# Patient Record
Sex: Female | Born: 1935 | Race: White | Hispanic: No | Smoking: Never smoker
Health system: Southern US, Community
[De-identification: ages and names within clinical notes are randomized; demographics above are authoritative.]

## PROBLEM LIST (undated history)

## (undated) DIAGNOSIS — F0390 Unspecified dementia without behavioral disturbance: Secondary | ICD-10-CM

## (undated) DIAGNOSIS — I10 Essential (primary) hypertension: Secondary | ICD-10-CM

## (undated) DIAGNOSIS — IMO0001 Reserved for inherently not codable concepts without codable children: Secondary | ICD-10-CM

## (undated) DIAGNOSIS — H9190 Unspecified hearing loss, unspecified ear: Secondary | ICD-10-CM

## (undated) DIAGNOSIS — F03A0 Unspecified dementia, mild, without behavioral disturbance, psychotic disturbance, mood disturbance, and anxiety: Secondary | ICD-10-CM

## (undated) DIAGNOSIS — E079 Disorder of thyroid, unspecified: Secondary | ICD-10-CM

## (undated) HISTORY — PX: CHOLECYSTECTOMY: SHX55

---

## 2004-05-10 ENCOUNTER — Ambulatory Visit: Payer: Self-pay | Admitting: Internal Medicine

## 2004-10-18 ENCOUNTER — Ambulatory Visit: Payer: Self-pay

## 2005-05-13 ENCOUNTER — Ambulatory Visit: Payer: Self-pay | Admitting: Internal Medicine

## 2006-05-21 ENCOUNTER — Ambulatory Visit: Payer: Self-pay | Admitting: Internal Medicine

## 2007-05-25 ENCOUNTER — Ambulatory Visit: Payer: Self-pay | Admitting: Internal Medicine

## 2008-05-25 ENCOUNTER — Ambulatory Visit: Payer: Self-pay | Admitting: Internal Medicine

## 2009-05-26 ENCOUNTER — Ambulatory Visit: Payer: Self-pay | Admitting: Internal Medicine

## 2010-05-08 ENCOUNTER — Ambulatory Visit: Payer: Self-pay | Admitting: Ophthalmology

## 2010-05-23 ENCOUNTER — Ambulatory Visit: Payer: Self-pay | Admitting: Ophthalmology

## 2010-05-28 ENCOUNTER — Ambulatory Visit: Payer: Self-pay | Admitting: Internal Medicine

## 2018-03-30 ENCOUNTER — Emergency Department: Payer: Medicare Other

## 2018-03-30 ENCOUNTER — Other Ambulatory Visit: Payer: Self-pay

## 2018-03-30 ENCOUNTER — Emergency Department
Admission: EM | Admit: 2018-03-30 | Discharge: 2018-03-30 | Disposition: A | Discharge status: Home / Self Care | Payer: Medicare Other | Attending: Emergency Medicine | Admitting: Emergency Medicine

## 2018-03-30 DIAGNOSIS — W010XXA Fall on same level from slipping, tripping and stumbling without subsequent striking against object, initial encounter: Secondary | ICD-10-CM | POA: Insufficient documentation

## 2018-03-30 DIAGNOSIS — Y998 Other external cause status: Secondary | ICD-10-CM | POA: Insufficient documentation

## 2018-03-30 DIAGNOSIS — Y9301 Activity, walking, marching and hiking: Secondary | ICD-10-CM | POA: Diagnosis not present

## 2018-03-30 DIAGNOSIS — Y92019 Unspecified place in single-family (private) house as the place of occurrence of the external cause: Secondary | ICD-10-CM | POA: Insufficient documentation

## 2018-03-30 DIAGNOSIS — E079 Disorder of thyroid, unspecified: Secondary | ICD-10-CM | POA: Diagnosis not present

## 2018-03-30 DIAGNOSIS — I10 Essential (primary) hypertension: Secondary | ICD-10-CM | POA: Diagnosis not present

## 2018-03-30 DIAGNOSIS — S4991XA Unspecified injury of right shoulder and upper arm, initial encounter: Secondary | ICD-10-CM | POA: Diagnosis present

## 2018-03-30 DIAGNOSIS — S42291A Other displaced fracture of upper end of right humerus, initial encounter for closed fracture: Secondary | ICD-10-CM | POA: Insufficient documentation

## 2018-03-30 HISTORY — DX: Disorder of thyroid, unspecified: E07.9

## 2018-03-30 HISTORY — DX: Essential (primary) hypertension: I10

## 2018-03-30 MED ORDER — TRAMADOL HCL 50 MG PO TABS: 50.0000 mg | ORAL_TABLET | Freq: Two times a day (BID) | ORAL | 0 refills | Status: DC | PRN

## 2018-03-30 MED ORDER — ONDANSETRON HCL 4 MG/2ML IJ SOLN
4.0000 mg | Freq: Once | INTRAMUSCULAR | Status: AC
  Administered 2018-03-30: 4 mg via INTRAVENOUS
  Filled 2018-03-30: qty 2

## 2018-03-30 MED ORDER — MORPHINE SULFATE (PF) 4 MG/ML IV SOLN
4.0000 mg | Freq: Once | INTRAVENOUS | Status: AC
  Administered 2018-03-30: 4 mg via INTRAVENOUS
  Filled 2018-03-30: qty 1

## 2018-03-30 NOTE — ED Triage Notes (Signed)
Pt to ER via [facility] from home after mechanical fall onto right shoulder. Severe pain with movement to right shoulder and upper right arm when palpating. VSS. CBG WNL. Denies other injuries. Pt alert and oriented X4, active, cooperative, pt in NAD. RR even and unlabored, color WNL.

## 2018-03-30 NOTE — ED Notes (Signed)
Gena from care management in with pt and family

## 2018-03-30 NOTE — ED Provider Notes (Signed)
Patient received in sign-out from Dr. Darnelle Catalan.  Workup and evaluation pending xr and reassessment.  Discussed case with Dr. Joice Lofts of orthopedics who agrees with plan for sling and follow-up as an outpatient.  Patient reassessed she remains neurovascularly intact and her pain is well controlled      Willy Eddy, MD 03/30/18 [phone]

## 2018-03-30 NOTE — ED Notes (Signed)
Resumed care from [facility] rn.  Pt alert. Family at bedside.

## 2018-03-30 NOTE — ED Provider Notes (Signed)
[facility] Emergency Department Provider Note   ____________________________________________   First MD Initiated Contact with Patient 03/30/18 1441     (approximate)  I have reviewed the triage vital signs and the nursing notes.   HISTORY  Chief Complaint Fall and Shoulder Pain   HPI Christina Wise is a 85 y.o. female who was in her home.  She was turning around backwards to go on another direction caught her foot and fell.  She landed on her right shoulder.  She did not hit her head.  She did not pass out.  She complains a lot of pain and swelling in the shoulder nowhere else.  No numbness in the hand or arm.  Normal function of the hand.   Past Medical History:  Diagnosis Date  . Hypertension   . Thyroid disease     There are no active problems to display for this patient.   History reviewed. No pertinent surgical history.  Prior to Admission medications   Medication Sig Start Date End Date Taking? Authorizing Provider  traMADol (ULTRAM) 50 MG tablet Take 1 tablet (50 mg total) by mouth every 12 (twelve) hours as needed for severe pain. 03/30/18 03/30/19  Willy Eddy, MD    Allergies Patient has no known allergies.  No family history on file.  Social History Social History   Tobacco Use  . Smoking status: Never Smoker  . Smokeless tobacco: Never Used  Substance Use Topics  . Alcohol use: Never    Frequency: Never  . Drug use: Not on file    Review of Systems  Constitutional: No fever/chills Eyes: No visual changes. ENT: No sore throat. Cardiovascular: Denies chest pain. Respiratory: Denies shortness of breath. Gastrointestinal: No abdominal pain.  No nausea, no vomiting.  No diarrhea.  No constipation. Genitourinary: Negative for dysuria. Musculoskeletal: Negative for back pain. Skin: Negative for rash. Neurological: Negative for headaches, focal weakness   ____________________________________________   PHYSICAL EXAM:  VITAL SIGNS: ED Triage Vitals  Enc Vitals Group     BP 03/30/18 1433 (!) 173/62     Pulse Rate 03/30/18 1433 65     Resp 03/30/18 1433 18     Temp 03/30/18 1433 98.2 F (36.8 C)     Temp Source 03/30/18 1433 Oral     SpO2 03/30/18 1433 95 %     Weight 03/30/18 1434 128 lb (58.1 kg)     Height 03/30/18 1434 5\' 4"  (1.626 m)     Head Circumference --      Peak Flow --      Pain Score 03/30/18 1434 2     Pain Loc --      Pain Edu? --      Excl. in GC? --     Constitutional: Alert and oriented. Well appearing and in no acute distress is long she does not try to move the shoulder. Eyes: Conjunctivae are normal.  Head: Atraumatic. Nose: No congestion/rhinnorhea. Mouth/Throat: Mucous membranes are moist.  Oropharynx non-erythematous. Neck: No stridor. Cardiovascular: Normal rate, regular rhythm. Grossly normal heart sounds.  Good peripheral circulation. Respiratory: Normal respiratory effort.  No retractions. Lungs CTAB. Gastrointestinal: Soft and nontender. No distention. No abdominal bruits. No CVA tenderness. Musculoskeletal: No lower extremity tenderness nor edema.  She does have swelling and bruising in the right shoulder Neurologic:  Normal speech and language. No gross focal neurologic deficits are appreciated.  Skin:  Skin is warm, dry and intact. No rash noted. Psychiatric: Mood and affect  are normal. Speech and behavior are normal.  ____________________________________________   LABS (all labs ordered are listed, but only abnormal results are displayed)  Labs Reviewed - No data to display ____________________________________________  EKG   ____________________________________________  RADIOLOGY  ED MD interpretation:   Official radiology report(s): Dg Shoulder Right  Result Date: 03/30/2018 CLINICAL DATA:  Fall with shoulder pain EXAM: RIGHT SHOULDER - 2+ VIEW COMPARISON:  None. FINDINGS:  Right lung apex is clear. Mild AC joint degenerative change. Acute mildly impacted fracture involving the right humeral neck with mild valgus angulation. Fracture lucencies are noted within the greater tuberosity of the proximal humerus. No dislocation is evident. IMPRESSION: Acute mildly comminuted and angulated fracture involving the proximal humerus at the greater tuberosity and humeral neck. Electronically Signed   By: Jasmine Pang M.D.   On: 03/30/2018 15:19    ____________________________________________   PROCEDURES  Procedure(s) performed:   Procedures  Critical Care performed:   ____________________________________________   INITIAL IMPRESSION / ASSESSMENT AND PLAN / ED COURSE     X-ray pending.  I have a critical patient.  I signed patient out to Dr. Roxan Hockey.       ____________________________________________   FINAL CLINICAL IMPRESSION(S) / ED DIAGNOSES  Final diagnoses:  Other closed displaced fracture of proximal end of right humerus, initial encounter     ED Discharge Orders         Ordered    traMADol (ULTRAM) 50 MG tablet  Every 12 hours PRN     03/30/18 1703           Note:  This document was prepared using Dragon voice recognition software and may include unintentional dictation errors.    Arnaldo Natal, MD 03/30/18 1750

## 2018-03-30 NOTE — Care Management Note (Signed)
Case Management Note  Patient Details  Name: SADIA FARONE MRN: [id number] Date of Birth: Apr 18, 1921  Subjective/Objective:         Patient is being seen in the ED after a fall at home resulting in right humerus fracture.  Patient will be discharge home with a right arm sling.  Patient lives alone and is independent.  Patient's son is at the bedside, he lives in [place].  Patient is very hard of hearing.  Because of this fall and the new arm immobilizer home health services were offered and accepted.  Choice offered to patient and she has no preference.  Referral made to [place] with [facility] for RN, PT, OT, aide, and SW, referral accepted.  No equipment needed at this time.  Patient will have a good friend spend the night with her tonight.  Son reports that this is the first fall in quite a while and the patient is not sure what happened.   Robbie Lis RN BSN [id number]               Action/Plan: Discharge with right arm immobilizer/sling. Home Health with [facility] RN, PT, OT, aide, SW Friend will spend the night with patient.  Expected Discharge Date:                  Expected Discharge Plan:  Home w Home Health Services  In-House Referral:     Discharge planning Services  CM Consult  Post Acute Care Choice:    Choice offered to:  Patient, Adult Children  DME Arranged:    DME Agency:     HH Arranged:  RN, PT, OT, Social Work, Nurse's Aide HH Agency:  [facility]  Status of Service:  Completed, signed off  If discussed at [employer], dates discussed:    Additional Comments:  Allayne Butcher, RN 03/30/2018, 5:24 PM

## 2018-03-30 NOTE — ED Notes (Signed)
md in with pt and family.   

## 2018-03-30 NOTE — ED Notes (Addendum)
Sling to  right arm.  Iv dc'ed.

## 2018-05-01 ENCOUNTER — Inpatient Hospital Stay
Admission: EM | Admit: 2018-05-01 | Discharge: 2018-05-06 | DRG: 640 | Disposition: A | Discharge status: Skilled Nursing Facility | Payer: Medicare Other | Attending: Internal Medicine | Admitting: Internal Medicine

## 2018-05-01 ENCOUNTER — Other Ambulatory Visit: Payer: Self-pay

## 2018-05-01 ENCOUNTER — Emergency Department: Payer: Medicare Other

## 2018-05-01 DIAGNOSIS — E039 Hypothyroidism, unspecified: Secondary | ICD-10-CM | POA: Diagnosis present

## 2018-05-01 DIAGNOSIS — R8271 Bacteriuria: Secondary | ICD-10-CM

## 2018-05-01 DIAGNOSIS — Z973 Presence of spectacles and contact lenses: Secondary | ICD-10-CM | POA: Diagnosis not present

## 2018-05-01 DIAGNOSIS — I248 Other forms of acute ischemic heart disease: Secondary | ICD-10-CM | POA: Diagnosis present

## 2018-05-01 DIAGNOSIS — W19XXXD Unspecified fall, subsequent encounter: Secondary | ICD-10-CM | POA: Diagnosis present

## 2018-05-01 DIAGNOSIS — E876 Hypokalemia: Secondary | ICD-10-CM | POA: Diagnosis present

## 2018-05-01 DIAGNOSIS — N179 Acute kidney failure, unspecified: Secondary | ICD-10-CM | POA: Diagnosis present

## 2018-05-01 DIAGNOSIS — Z79899 Other long term (current) drug therapy: Secondary | ICD-10-CM

## 2018-05-01 DIAGNOSIS — R339 Retention of urine, unspecified: Secondary | ICD-10-CM | POA: Diagnosis present

## 2018-05-01 DIAGNOSIS — Z681 Body mass index (BMI) 19 or less, adult: Secondary | ICD-10-CM

## 2018-05-01 DIAGNOSIS — G9341 Metabolic encephalopathy: Secondary | ICD-10-CM | POA: Diagnosis present

## 2018-05-01 DIAGNOSIS — H9190 Unspecified hearing loss, unspecified ear: Secondary | ICD-10-CM | POA: Diagnosis present

## 2018-05-01 DIAGNOSIS — Z8249 Family history of ischemic heart disease and other diseases of the circulatory system: Secondary | ICD-10-CM | POA: Diagnosis not present

## 2018-05-01 DIAGNOSIS — R4182 Altered mental status, unspecified: Secondary | ICD-10-CM | POA: Diagnosis present

## 2018-05-01 DIAGNOSIS — E43 Unspecified severe protein-calorie malnutrition: Secondary | ICD-10-CM | POA: Diagnosis present

## 2018-05-01 DIAGNOSIS — E871 Hypo-osmolality and hyponatremia: Principal | ICD-10-CM | POA: Diagnosis present

## 2018-05-01 DIAGNOSIS — I491 Atrial premature depolarization: Secondary | ICD-10-CM | POA: Diagnosis present

## 2018-05-01 DIAGNOSIS — Z66 Do not resuscitate: Secondary | ICD-10-CM | POA: Diagnosis present

## 2018-05-01 DIAGNOSIS — Z7982 Long term (current) use of aspirin: Secondary | ICD-10-CM

## 2018-05-01 DIAGNOSIS — F0390 Unspecified dementia without behavioral disturbance: Secondary | ICD-10-CM | POA: Diagnosis present

## 2018-05-01 DIAGNOSIS — I119 Hypertensive heart disease without heart failure: Secondary | ICD-10-CM | POA: Diagnosis present

## 2018-05-01 DIAGNOSIS — F419 Anxiety disorder, unspecified: Secondary | ICD-10-CM | POA: Diagnosis present

## 2018-05-01 DIAGNOSIS — S42201D Unspecified fracture of upper end of right humerus, subsequent encounter for fracture with routine healing: Secondary | ICD-10-CM | POA: Diagnosis not present

## 2018-05-01 HISTORY — DX: Unspecified hearing loss, unspecified ear: H91.90

## 2018-05-01 HISTORY — DX: Unspecified dementia without behavioral disturbance: F03.90

## 2018-05-01 HISTORY — DX: Unspecified dementia, mild, without behavioral disturbance, psychotic disturbance, mood disturbance, and anxiety: F03.A0

## 2018-05-01 HISTORY — DX: Reserved for inherently not codable concepts without codable children: IMO0001

## 2018-05-01 LAB — URINALYSIS, COMPLETE (UACMP) WITH MICROSCOPIC
Bilirubin Urine: NEGATIVE
GLUCOSE, UA: NEGATIVE mg/dL
Hgb urine dipstick: NEGATIVE
KETONES UR: 5 mg/dL — AB
Leukocytes,Ua: NEGATIVE
Nitrite: NEGATIVE
PROTEIN: NEGATIVE mg/dL
Specific Gravity, Urine: 1.01 (ref 1.005–1.030)
pH: 7 (ref 5.0–8.0)

## 2018-05-01 LAB — CBC
HCT: 34.8 % — ABNORMAL LOW (ref 36.0–46.0)
Hemoglobin: 13 g/dL (ref 12.0–15.0)
MCH: 29.3 pg (ref 26.0–34.0)
MCHC: 37.4 g/dL — ABNORMAL HIGH (ref 30.0–36.0)
MCV: 78.6 fL — ABNORMAL LOW (ref 80.0–100.0)
PLATELETS: 270 10*3/uL (ref 150–400)
RBC: 4.43 MIL/uL (ref 3.87–5.11)
RDW: 13 % (ref 11.5–15.5)
WBC: 14.8 10*3/uL — ABNORMAL HIGH (ref 4.0–10.5)
nRBC: 0 % (ref 0.0–0.2)

## 2018-05-01 LAB — COMPREHENSIVE METABOLIC PANEL
ALT: 18 U/L (ref 0–44)
AST: 38 U/L (ref 15–41)
Albumin: 4.2 g/dL (ref 3.5–5.0)
Alkaline Phosphatase: 130 U/L — ABNORMAL HIGH (ref 38–126)
Anion gap: 14 (ref 5–15)
BUN: 26 mg/dL — ABNORMAL HIGH (ref 8–23)
CO2: 22 mmol/L (ref 22–32)
Calcium: 8.6 mg/dL — ABNORMAL LOW (ref 8.9–10.3)
Chloride: 73 mmol/L — ABNORMAL LOW (ref 98–111)
Creatinine, Ser: 1.02 mg/dL — ABNORMAL HIGH (ref 0.44–1.00)
GFR calc Af Amer: 55 mL/min — ABNORMAL LOW (ref >60)
GFR calc non Af Amer: 47 mL/min — ABNORMAL LOW (ref >60)
Glucose, Bld: 110 mg/dL — ABNORMAL HIGH (ref 70–99)
Potassium: 3.4 mmol/L — ABNORMAL LOW (ref 3.5–5.1)
SODIUM: 109 mmol/L — AB (ref 135–145)
Total Bilirubin: 1.9 mg/dL — ABNORMAL HIGH (ref 0.3–1.2)
Total Protein: 6.2 g/dL — ABNORMAL LOW (ref 6.5–8.1)

## 2018-05-01 LAB — SODIUM, URINE, RANDOM: SODIUM UR: 35 mmol/L

## 2018-05-01 LAB — OSMOLALITY, URINE: Osmolality, Ur: 335 mOsm/kg (ref 300–900)

## 2018-05-01 LAB — SODIUM: Sodium: 112 mmol/L — CL (ref 135–145)

## 2018-05-01 LAB — MAGNESIUM: Magnesium: 1.6 mg/dL — ABNORMAL LOW (ref 1.7–2.4)

## 2018-05-01 LAB — GLUCOSE, CAPILLARY
Glucose-Capillary: 113 mg/dL — ABNORMAL HIGH (ref 70–99)
Glucose-Capillary: 104 mg/dL — ABNORMAL HIGH (ref 70–99)

## 2018-05-01 LAB — PHOSPHORUS: PHOSPHORUS: 2.8 mg/dL (ref 2.5–4.6)

## 2018-05-01 LAB — AMMONIA: Ammonia: <9 umol/L — ABNORMAL LOW (ref 9–35)

## 2018-05-01 LAB — TROPONIN I: Troponin I: 0.05 ng/mL (ref <0.03)

## 2018-05-01 MED ORDER — MONTELUKAST SODIUM 10 MG PO TABS
10.0000 mg | ORAL_TABLET | Freq: Every day | ORAL | Status: DC
  Administered 2018-05-01 – 2018-05-05 (×5): 10 mg via ORAL
  Filled 2018-05-01 (×5): qty 1

## 2018-05-01 MED ORDER — ONDANSETRON HCL 4 MG PO TABS: 4.0000 mg | ORAL_TABLET | Freq: Four times a day (QID) | ORAL | Status: DC | PRN

## 2018-05-01 MED ORDER — LEVOTHYROXINE SODIUM 50 MCG PO TABS
50.0000 ug | ORAL_TABLET | Freq: Every day | ORAL | Status: DC
  Administered 2018-05-02 – 2018-05-06 (×5): 50 ug via ORAL
  Filled 2018-05-01 (×5): qty 1

## 2018-05-01 MED ORDER — ENOXAPARIN SODIUM 30 MG/0.3ML ~~LOC~~ SOLN
30.0000 mg | SUBCUTANEOUS | Status: DC
  Administered 2018-05-01: 30 mg via SUBCUTANEOUS
  Filled 2018-05-01: qty 0.3

## 2018-05-01 MED ORDER — ONDANSETRON HCL 4 MG/2ML IJ SOLN: 4.0000 mg | Freq: Four times a day (QID) | INTRAMUSCULAR | Status: DC | PRN

## 2018-05-01 MED ORDER — AMLODIPINE BESYLATE 5 MG PO TABS
5.0000 mg | ORAL_TABLET | Freq: Every day | ORAL | Status: DC
  Administered 2018-05-02 – 2018-05-06 (×5): 5 mg via ORAL
  Filled 2018-05-01 (×5): qty 1

## 2018-05-01 MED ORDER — ALPRAZOLAM 0.25 MG PO TABS
0.2500 mg | ORAL_TABLET | Freq: Three times a day (TID) | ORAL | Status: DC
  Administered 2018-05-01 – 2018-05-05 (×8): 0.25 mg via ORAL
  Filled 2018-05-01 (×8): qty 1

## 2018-05-01 MED ORDER — SODIUM CHLORIDE 0.9 % IV BOLUS
1000.0000 mL | Freq: Once | INTRAVENOUS | Status: AC
  Administered 2018-05-01: 1000 mL via INTRAVENOUS

## 2018-05-01 MED ORDER — CITALOPRAM HYDROBROMIDE 20 MG PO TABS: 20.0000 mg | ORAL_TABLET | Freq: Every day | ORAL | Status: DC

## 2018-05-01 MED ORDER — ASPIRIN EC 81 MG PO TBEC
81.0000 mg | DELAYED_RELEASE_TABLET | Freq: Every day | ORAL | Status: DC
  Administered 2018-05-02 – 2018-05-06 (×5): 81 mg via ORAL
  Filled 2018-05-01 (×5): qty 1

## 2018-05-01 MED ORDER — SODIUM CHLORIDE 0.9 % IV SOLN
INTRAVENOUS | Status: DC
  Administered 2018-05-01: 21:00:00 via INTRAVENOUS

## 2018-05-01 MED ORDER — ENOXAPARIN SODIUM 40 MG/0.4ML ~~LOC~~ SOLN: 40.0000 mg | SUBCUTANEOUS | Status: DC

## 2018-05-01 MED ORDER — POTASSIUM CHLORIDE 20 MEQ PO PACK
20.0000 meq | PACK | Freq: Once | ORAL | Status: AC
  Administered 2018-05-01: 20 meq via ORAL
  Filled 2018-05-01: qty 1

## 2018-05-01 MED ORDER — ACETAMINOPHEN 325 MG PO TABS
650.0000 mg | ORAL_TABLET | Freq: Four times a day (QID) | ORAL | Status: DC | PRN
  Administered 2018-05-01: 650 mg via ORAL
  Filled 2018-05-01: qty 2

## 2018-05-01 MED ORDER — ACETAMINOPHEN 650 MG RE SUPP: 650.0000 mg | Freq: Four times a day (QID) | RECTAL | Status: DC | PRN

## 2018-05-01 MED ORDER — SODIUM CHLORIDE 0.9 % IV SOLN
1.0000 g | Freq: Once | INTRAVENOUS | Status: AC
  Administered 2018-05-01: 1 g via INTRAVENOUS
  Filled 2018-05-01: qty 10

## 2018-05-01 NOTE — ED Notes (Addendum)
Notified Dr Sharma Covert of Sodium of 109 and troponin of 0.05- no new orders given at this time

## 2018-05-01 NOTE — H&P (Signed)
[facility] [place] [facility]   PATIENT NAME: Christina Wise    MR#:  [id number]  DATE OF BIRTH:  June 06, 1921  DATE OF ADMISSION:  05/01/2018  PRIMARY CARE PHYSICIAN: Kandyce Rud, MD   REQUESTING/REFERRING PHYSICIAN: Dr Virgilio Frees  CHIEF COMPLAINT:   Chief Complaint  Patient presents with  . Weakness    HISTORY OF PRESENT ILLNESS:  Christina Wise  is a 85 y.o. female with a known history of hypertension and hypothyroidism presents with weakness and decline in mental status.  As per the son patient had a fall 1 month ago and fractured her humerus.  She is out of the sling right now.  She has been having home health.  She lives alone but has a friend staying with her.  She has not been eating or drinking very well over the last few days.  On Wednesday she had trouble getting into the car.  The last couple days she has been having difficulty walking.  Last night and today could not even stand.  She has been very weak and tired.  Patient is very hard of hearing and difficult with communication right at this time.  History obtained from old chart and the son at the bedside.  PAST MEDICAL HISTORY:   Past Medical History:  Diagnosis Date  . Hearing impairment   . Hypertension   . Mild dementia (HCC)   . Thyroid disease     PAST SURGICAL HISTORY:   Past Surgical History:  Procedure Laterality Date  . CHOLECYSTECTOMY      SOCIAL HISTORY:   Social History   Tobacco Use  . Smoking status: Never Smoker  . Smokeless tobacco: Never Used  Substance Use Topics  . Alcohol use: Never    Frequency: Never    FAMILY HISTORY:   Family History  Problem Relation Age of Onset  . Hypertension Mother     DRUG ALLERGIES:  No Known Allergies  REVIEW OF SYSTEMS:  CONSTITUTIONAL: No fever.  Positive for fatigue EYES: Wears glasses EARS, NOSE, AND THROAT: Extremely hard of hearing RESPIRATORY: No cough, shortness of breath.   CARDIOVASCULAR: No chest pain.  GASTROINTESTINAL: No nausea, vomiting, diarrhea or abdominal pain.  GENITOURINARY: Complains of burning in her vagina ENDOCRINE: Positive for hypothyroidism HEMATOLOGY: No anemia SKIN: No rash. MUSCULOSKELETAL: Occasional shoulder pain.   NEUROLOGIC: Positive for weakness.  PSYCHIATRY: History of anxiety  MEDICATIONS AT HOME:   Prior to Admission medications   Medication Sig Start Date End Date Taking? Authorizing Provider  traMADol (ULTRAM) 50 MG tablet Take 1 tablet (50 mg total) by mouth every 12 (twelve) hours as needed for severe pain. 03/30/18 03/30/19  Willy Eddy, MD   Medication reconciliation still undergoing  Looking at [facility] clinic note.  Patient takes Xanax 0.25 mg 3 times a day as needed, amlodipine 5 mg daily, aspirin 81 mg daily, calcium carbonate and vitamin D twice a day, Zyrtec 10 mg daily, Celexa 20 mg daily, glucosamine chondroitin daily, hydrochlorothiazide 25 mg daily, levothyroxine 75 mcg daily, losartan 50 mg daily, Singulair 10 mg daily, fish oil twice a day, tramadol as needed.  As per son finished up Cipro last night  VITAL SIGNS:  Blood pressure (!) 146/65, pulse (!) 52, temperature (!) 97.4 F (36.3 C), temperature source Oral, resp. rate 17, height 5\' 4"  (1.626 m), weight 58.1 kg, SpO2 99 %.  PHYSICAL EXAMINATION:  GENERAL:  85 y.o.-year-old patient lying in the bed with no acute distress.  EYES: Pupils  equal, round, reactive to light and accommodation. No scleral icterus. Extraocular muscles intact.  HEENT: Head atraumatic, normocephalic. Oropharynx and nasopharynx clear.  NECK:  Supple, no jugular venous distention. No thyroid enlargement, no tenderness.  LUNGS: Normal breath sounds bilaterally, no wheezing, rales,rhonchi or crepitation. No use of accessory muscles of respiration.  CARDIOVASCULAR: S1, S2 normal. No murmurs, rubs, or gallops.  ABDOMEN: Soft, nontender, nondistended. Bowel sounds present. No  organomegaly or mass.  EXTREMITIES: No pedal edema, cyanosis, or clubbing.  NEUROLOGIC: Patient moving all extremities on her own.  Patient is very difficult with her hearing. Gait not checked.  PSYCHIATRIC: The patient is alert and answers some questions.  SKIN: No rash, lesion, or ulcer.   LABORATORY PANEL:   CBC Recent Labs  Lab 05/01/18 1611  WBC 14.8*  HGB 13.0  HCT 34.8*  PLT 270   ------------------------------------------------------------------------------------------------------------------  Chemistries  Recent Labs  Lab 05/01/18 1611  NA 109*  K 3.4*  CL 73*  CO2 22  GLUCOSE 110*  BUN 26*  CREATININE 1.02*  CALCIUM 8.6*  AST 38  ALT 18  ALKPHOS 130*  BILITOT 1.9*   ------------------------------------------------------------------------------------------------------------------  Cardiac Enzymes Recent Labs  Lab 05/01/18 1611  TROPONINI 0.05*   ------------------------------------------------------------------------------------------------------------------  RADIOLOGY:  Dg Chest 2 View  Result Date: 05/01/2018 CLINICAL DATA:  Pt to ED for AMS. Pt is unable to describe what is wrong or give hx. Pt's relative states pt has increased weakness over the past couple of days and is not eating, also states pt was treated for a UTI with Cipro one week ago-states she is not feeling any better. History of hypertension. EXAM: CHEST - 2 VIEW COMPARISON:  RIGHT shoulder on 03/30/2018 FINDINGS: Shallow lung inflation. Heart size is accentuated by low lung volumes. There are no focal consolidations or pleural effusions. No pulmonary edema. Again noted is impacted RIGHT humerus fracture. IMPRESSION: 1. Shallow inflation. 2. No evidence for acute cardiopulmonary abnormality. 3. Stable appearance of RIGHT humerus fracture. Electronically Signed   By: Norva PavlovElizabeth  Brown M.D.   On: 05/01/2018 16:57   Ct Head Wo Contrast  Result Date: 05/01/2018 CLINICAL DATA:  Altered mental  status and weakness EXAM: CT HEAD WITHOUT CONTRAST TECHNIQUE: Contiguous axial images were obtained from the base of the skull through the vertex without intravenous contrast. COMPARISON:  None. FINDINGS: Brain: Generalized atrophic and chronic ischemic changes are noted. No findings to suggest acute hemorrhage, acute infarction or space-occupying mass lesion are noted. Vascular: No hyperdense vessel or unexpected calcification. Skull: Normal. Negative for fracture or focal lesion. Sinuses/Orbits: No acute finding. Other: None. IMPRESSION: Chronic atrophic and ischemic changes without acute abnormality. Electronically Signed   By: Alcide CleverMark  Lukens M.D.   On: 05/01/2018 17:13    EKG:   To me the EKG looks like sinus arrhythmia 61 bpm, LVH  IMPRESSION AND PLAN:   1.  Severe hyponatremia with weakness and acute metabolic encephalopathy.  Likely combination from hydrochlorothiazide, not eating and drinking very well.  Will give gentle IV fluids with normal saline 40 cc/h.  Check urine osmolarity and urine random sodium.  Check serial sodiums every 4 hours.  If sodium in the next 24 hours goes above 117 we will stop the IV fluids. 2.  Essential hypertension.  Stop hydrochlorothiazide this is not a good medication for her at this point in time.  Continue Norvasc. 3.  Hypothyroidism unspecified.  Check a TSH tomorrow. 4.  Mild dementia 5.  Hypokalemia replace potassium orally 6.  Once sodium  comes up in the 120s can get physical therapy evaluation   All the records are reviewed and case discussed with ED provider. Management plans discussed with the patient, family and they are in agreement.  CODE STATUS: DNR  TOTAL TIME TAKING CARE OF THIS PATIENT: 50 minutes.    Alford Highland M.D on 05/01/2018 at 6:50 PM  Between 7am to 6pm - Pager - [phone]  After 6pm call admission pager [phone]  [facility] Office  [phone]  CC: Primary care physician; Kandyce Rud, MD

## 2018-05-01 NOTE — Consult Note (Signed)
PULMONARY / CRITICAL CARE MEDICINE  Name: Christina Wise MRN: [id number] DOB: Nov 17, 1921    LOS: 0  Referring Provider:  Dr Renae Gloss Reason for Referral:  Hyponatremia  HPI:  This is a 85 y/o female who presented to the ED with altered mental status, and generalized weakness.  History is obtained from ED records as patient is still confused.  Per ED records, patient's son indicated that she fell about a month ago and fractured her right humerus.  She did not undergo surgical intervention.  She was discharged home with a sling and home health care.  Recently patient's appetite has been declining and she has not been feeling well overall.  This week her functional status continued to decline with increased difficulty walking.  Last night patient was noted to be severely weak and unable to stand up.  She was brought to the emergency room for evaluation.  At the ED, her sodium level was 109, potassium 3.4, chloride 73, bun 26 and creatinine 1.02.  She was admitted to the ICU for further management She is awake, confused and offers no complaints. Past Medical History:  Diagnosis Date  . Hearing impairment   . Hypertension   . Mild dementia (HCC)   . Thyroid disease    Past Surgical History:  Procedure Laterality Date  . CHOLECYSTECTOMY     Prior to Admission medications   Medication Sig Start Date End Date Taking? Authorizing Provider  amLODipine (NORVASC) 5 MG tablet Take 5 mg by mouth daily.   Yes [provider]  clopidogrel (PLAVIX) 75 MG tablet Take 75 mg by mouth daily.   Yes [provider]  donepezil (ARICEPT) 5 MG tablet Take 1 tablet (5 mg total) by mouth at bedtime. 10/06/17 11/15/17 Yes Sowles, Danna Hefty, MD  empagliflozin (JARDIANCE) 25 MG TABS tablet Take 25 mg by mouth daily.   Yes [provider]  glycopyrrolate (ROBINUL) 1 MG tablet Take 1 mg by mouth 2 (two) times daily.   Yes [provider]  insulin aspart (NOVOLOG FLEXPEN) 100  UNIT/ML FlexPen Inject 12 Units into the skin 2 (two) times daily.   Yes [provider]  insulin aspart (NOVOLOG) 100 UNIT/ML FlexPen Inject 18 Units into the skin daily. At 1700   Yes [provider]  Insulin Degludec-Liraglutide (XULTOPHY) 100-3.6 UNIT-MG/ML SOPN Inject 50 Units into the skin daily.   Yes [provider]  levETIRAcetam (KEPPRA) 500 MG tablet Take 500 mg by mouth 2 (two) times daily.   Yes [provider]  lipase/protease/amylase (CREON) 12000 units CPEP capsule Take 6,000 Units by mouth 3 (three) times daily before meals.   Yes [provider]  lipase/protease/amylase (CREON) 12000 units CPEP capsule Take 3,000 Units by mouth at bedtime. With snack   Yes [provider]  lisinopril (PRINIVIL,ZESTRIL) 5 MG tablet Take 5 mg by mouth daily.   Yes [provider]  metoprolol succinate (TOPROL-XL) 25 MG 24 hr tablet Take 1 tablet (25 mg total) by mouth daily. 10/06/17  Yes Sowles, Danna Hefty, MD  rosuvastatin (CRESTOR) 40 MG tablet Take 1 tablet (40 mg total) by mouth daily. 10/06/17 11/15/17 Yes Alba Cory, MD  aspirin EC 81 MG tablet Take 81 mg by mouth daily.    [provider]  famotidine (PEPCID) 20 MG tablet Take 1 tablet (20 mg total) by mouth 2 (two) times daily. 10/06/17 11/05/17  Alba Cory, MD  gabapentin (NEURONTIN) 300 MG capsule Take 1 capsule (300 mg total) by mouth 2 (two)  times daily. 10/06/17 11/05/17  Alba Cory, MD  insulin glargine (LANTUS) 100 UNIT/ML injection Inject 0.1 mLs (10 Units total) into the skin daily. 10/06/17 11/05/17  Alba Cory, MD  lacosamide 100 MG TABS Take 1 tablet (100 mg total) by mouth 2 (two) times daily. Patient not taking: Reported on 11/15/2017 02/21/17   Enedina Finner, MD  promethazine (PHENERGAN) 12.5 MG tablet Take 1 tablet (12.5 mg total) by mouth every 6 (six) hours as needed for nausea or vomiting. Patient not taking: Reported on 11/15/2017 12/10/16   Almond Lint, MD  sertraline (ZOLOFT) 25 MG tablet Take 1 tablet (25 mg total) by mouth daily. Patient not taking: Reported on 11/15/2017 10/06/17   Alba Cory, MD   Allergies Allergies  Allergen Reactions  . Alendronate Other (See Comments)    GI upset  . Codeine Nausea And Vomiting  . Risedronate Other (See Comments)    GI Upset    Family History Family History  Problem Relation Age of Onset  . Hypertension Mother    Social History  reports that she has never smoked. She has never used smokeless tobacco. She reports that she does not drink alcohol or use drugs.  Review Of Systems: Unable to obtain as patient is confused  VITAL SIGNS: BP 139/71   Pulse 60   Temp (!) 97.4 F (36.3 C) (Oral)   Resp 15   Ht 5\' 4"  (1.626 m)   Wt 58.1 kg   SpO2 98%   BMI 21.97 kg/m   HEMODYNAMICS:    VENTILATOR SETTINGS:    INTAKE / OUTPUT: I/O last 3 completed shifts: In: 1100 [IV Piggyback:1100] Out: -   PHYSICAL EXAMINATION: General: No acute distress HEENT: PERRLA, trachea midline, no JVD Neuro: Awake, alert, confused, moves all extremities Cardiovascular: Apical pulse regular, S1-S2, no murmur regurg or gallop, +2 pulses bilaterally Lungs: Bilateral breath sounds, diminished in the bases, no wheezes or rhonchi Abdomen: Nondistended, normal bowel sounds in all 4 quadrants, palpation reveals no organomegaly Musculoskeletal: Pain with flexion and extension of the right knee, no other joint deformities Skin: Left cheek bruise, no rash or lesions  LABS:  BMET Recent Labs  Lab 05/01/18 1611  NA 109*  K 3.4*  CL 73*  CO2 22  BUN 26*  CREATININE 1.02*  GLUCOSE 110*    Electrolytes Recent Labs  Lab 05/01/18 1611  CALCIUM 8.6*    CBC Recent Labs  Lab 05/01/18 1611  WBC 14.8*  HGB 13.0  HCT 34.8*  PLT 270    Coag's No results for input(s): APTT, INR in the last 168 hours.  Sepsis Markers No results for input(s): LATICACIDVEN, PROCALCITON, O2SATVEN in the  last 168 hours.  ABG No results for input(s): PHART, PCO2ART, PO2ART in the last 168 hours.  Liver Enzymes Recent Labs  Lab 05/01/18 1611  AST 38  ALT 18  ALKPHOS 130*  BILITOT 1.9*  ALBUMIN 4.2    Cardiac Enzymes Recent Labs  Lab 05/01/18 1611  TROPONINI 0.05*    Glucose Recent Labs  Lab 05/01/18 1635 05/01/18 2013  GLUCAP 113* 104*    Imaging Dg Chest 2 View  Result Date: 05/01/2018 CLINICAL DATA:  Pt to ED for AMS. Pt is unable to describe what is wrong or give hx. Pt's relative states pt has increased weakness over the past couple of days and is not eating, also states pt was treated for a UTI with Cipro one week ago-states she is not feeling any better. History of hypertension.  EXAM: CHEST - 2 VIEW COMPARISON:  RIGHT shoulder on 03/30/2018 FINDINGS: Shallow lung inflation. Heart size is accentuated by low lung volumes. There are no focal consolidations or pleural effusions. No pulmonary edema. Again noted is impacted RIGHT humerus fracture. IMPRESSION: 1. Shallow inflation. 2. No evidence for acute cardiopulmonary abnormality. 3. Stable appearance of RIGHT humerus fracture. Electronically Signed   By: Norva Pavlov M.D.   On: 05/01/2018 16:57   Ct Head Wo Contrast  Result Date: 05/01/2018 CLINICAL DATA:  Altered mental status and weakness EXAM: CT HEAD WITHOUT CONTRAST TECHNIQUE: Contiguous axial images were obtained from the base of the skull through the vertex without intravenous contrast. COMPARISON:  None. FINDINGS: Brain: Generalized atrophic and chronic ischemic changes are noted. No findings to suggest acute hemorrhage, acute infarction or space-occupying mass lesion are noted. Vascular: No hyperdense vessel or unexpected calcification. Skull: Normal. Negative for fracture or focal lesion. Sinuses/Orbits: No acute finding. Other: None. IMPRESSION: Chronic atrophic and ischemic changes without acute abnormality. Electronically Signed   By: Alcide Clever M.D.   On:  05/01/2018 17:13    STUDIES:  None  CULTURES: None  ANTIBIOTICS: None  SIGNIFICANT EVENTS: 05/02/2018: Admitted  LINES/TUBES: Peripheral IVs  DISCUSSION: 85 year old female presenting with acute metabolic encephalopathy, hyponatremia and volume depletion from poor oral intake.  ASSESSMENT Hyponatremia Hypomagnesemia Hypochloremia Hydration Mild leukocytosis Acute encephalopathy secondary to hyponatremia and volume depletion   PLAN Gentle IV hydration with normal saline Monitor sodium levels every 4 hours Correct electrolyte abnormalities Continue all home medications except hydrochlorothiazide Encourage oral intake as tolerated  Best Practice: Code Status: DNR Diet: Regular diet GI prophylaxis: Not indicated VTE prophylaxis: SCDs and Lovenox  FAMILY  - Updates: Family at bedside.  Will update when available.   S. [facility] ANP-BC Pulmonary and Critical Care Medicine [facility] Pager [phone] or [phone]  NB: This document was prepared using Dragon voice recognition software and may include unintentional dictation errors.    05/01/2018, 8:26 PM

## 2018-05-01 NOTE — ED Notes (Signed)
Pt assisted onto bedpan- voided a small amount of urine

## 2018-05-01 NOTE — ED Notes (Addendum)
Update to handoff: call Pattricia Boss, RN [phone]

## 2018-05-01 NOTE — Progress Notes (Signed)
Pharmacy Lovenox Dosing  85 y.o. female admitted with Weakness . Patient ordered Lovenox 40 mg daily for VTE prophylaxis.   Filed Weights   05/01/18 1609  Weight: 128 lb (58.1 kg)    Body mass index is 21.97 kg/m.  Estimated Creatinine Clearance: 29.1 mL/min (A) (by C-G formula based on SCr of 1.02 mg/dL (H)).  Will adjust Lovenox dosing to 30 mg daily.   Bettey Costa 05/01/2018 8:18 PM

## 2018-05-01 NOTE — ED Provider Notes (Addendum)
[facility] Emergency Department Provider Note  ____________________________________________  Time seen: Approximately 4:11 PM  I have reviewed the triage vital signs and the nursing notes.   HISTORY  Chief Complaint Weakness    HPI Christina Wise is a 85 y.o. female w/ a hx of HTN presenting for AMS and weakness/not walking.  The patient is reported to live alone with a daughter who checks on her daily.  Here, she unable to give any history and does not answer most of my questions.  Per report, the patient just completed a course of ciprofloxacin for UTI.  The patient reports lower abdominal pain on my exam but is unable to describe it or show me where it is.   Past Medical History:  Diagnosis Date  . Hypertension   . Thyroid disease     There are no active problems to display for this patient.   History reviewed. No pertinent surgical history.  Current Outpatient Rx  . Order #: [id number] Class: Normal    Allergies Patient has no known allergies.  No family history on file.  Social History Social History   Tobacco Use  . Smoking status: Never Smoker  . Smokeless tobacco: Never Used  Substance Use Topics  . Alcohol use: Never    Frequency: Never  . Drug use: Not on file    Review of Systems Unable to obtain due to patient mental status.   ____________________________________________   PHYSICAL EXAM:  VITAL SIGNS: ED Triage Vitals [05/01/18 1609]  Enc Vitals Group     BP      Pulse      Resp      Temp      Temp src      SpO2      Weight 128 lb (58.1 kg)     Height  (1.626 m)     Head Circumference      Peak Flow      Pain Score 0     Pain Loc      Pain Edu?      Excl. in GC?     Constitutional: The patient is alert and protecting her airway but is not able to answer question or follow basic commands. Eyes: Conjunctivae are normal.  EOMI. PERRLA.  No scleral icterus. Head: Bruising over the left zygomatic  arch.. Nose: No congestion/rhinnorhea.  No swelling over the nose or septal hematoma. Mouth/Throat: Mucous membranes are mildly dry.  No obvious dental injury or malocclusion..  Neck: No stridor.  Supple.  No meningismus. Cardiovascular: Normal rate, regular rhythm. No murmurs, rubs or gallops.  Respiratory: Normal respiratory effort.  No accessory muscle use or retractions. Lungs CTAB.  No wheezes, rales or ronchi. Gastrointestinal: Soft, nontender and mildly distended.  I am unable to reproduce any tenderness on my examination.  No guarding or rebound.  No peritoneal signs. Musculoskeletal: Pelvis is stable.  No LE edema. Neurologic:  A&Ox3.  Speech is clear.  Face and smile are symmetric.  EOMI.  Moves all extremities well. Skin:  Skin is warm, dry and intact. No rash noted.  Large area of ecchymosis noted to the right upper extremity from the mid upper arm through the forearm. Psychiatric: Unable to assess due to altered mental status.  ____________________________________________   LABS (all labs ordered are listed, but only abnormal results are displayed)  Labs Reviewed  URINALYSIS, COMPLETE (UACMP) WITH MICROSCOPIC - Abnormal; Notable for the following components:      Result Value  Color, Urine YELLOW (*)    APPearance CLEAR (*)    Ketones, ur 5 (*)    Bacteria, UA RARE (*)    All other components within normal limits  CBC - Abnormal; Notable for the following components:   WBC 14.8 (*)    HCT 34.8 (*)    MCV 78.6 (*)    MCHC 37.4 (*)    All other components within normal limits  COMPREHENSIVE METABOLIC PANEL - Abnormal; Notable for the following components:   Sodium 109 (*)    Potassium 3.4 (*)    Chloride 73 (*)    Glucose, Bld 110 (*)    BUN 26 (*)    Creatinine, Ser 1.02 (*)    Calcium 8.6 (*)    Total Protein 6.2 (*)    Alkaline Phosphatase 130 (*)    Total Bilirubin 1.9 (*)    GFR calc non Af Amer 47 (*)    GFR calc Af Amer 55 (*)    All other components  within normal limits  TROPONIN I - Abnormal; Notable for the following components:   Troponin I 0.05 (*)    All other components within normal limits  GLUCOSE, CAPILLARY - Abnormal; Notable for the following components:   Glucose-Capillary 113 (*)    All other components within normal limits  URINE CULTURE  AMMONIA  CBG MONITORING, ED   ____________________________________________  EKG  ED ECG REPORT I, Anne-Caroline Sharma Covert, the attending physician, personally viewed and interpreted this ECG.   Date: 05/01/2018  EKG Time: 1615  Rate: 61  Rhythm: normal sinus rhythm; PAC; Theron Arista says a flutter but this is due to poor baseline tracing and the patient is in sinus rhythm.   Axis: normal  Intervals:none  ST&T Change: No STEMI   ____________________________________________  RADIOLOGY  Dg Chest 2 View  Result Date: 05/01/2018 CLINICAL DATA:  Pt to ED for AMS. Pt is unable to describe what is wrong or give hx. Pt's relative states pt has increased weakness over the past couple of days and is not eating, also states pt was treated for a UTI with Cipro one week ago-states she is not feeling any better. History of hypertension. EXAM: CHEST - 2 VIEW COMPARISON:  RIGHT shoulder on 03/30/2018 FINDINGS: Shallow lung inflation. Heart size is accentuated by low lung volumes. There are no focal consolidations or pleural effusions. No pulmonary edema. Again noted is impacted RIGHT humerus fracture. IMPRESSION: 1. Shallow inflation. 2. No evidence for acute cardiopulmonary abnormality. 3. Stable appearance of RIGHT humerus fracture. Electronically Signed   By: Norva Pavlov M.D.   On: 05/01/2018 16:57   Ct Head Wo Contrast  Result Date: 05/01/2018 CLINICAL DATA:  Altered mental status and weakness EXAM: CT HEAD WITHOUT CONTRAST TECHNIQUE: Contiguous axial images were obtained from the base of the skull through the vertex without intravenous contrast. COMPARISON:  None. FINDINGS: Brain: Generalized  atrophic and chronic ischemic changes are noted. No findings to suggest acute hemorrhage, acute infarction or space-occupying mass lesion are noted. Vascular: No hyperdense vessel or unexpected calcification. Skull: Normal. Negative for fracture or focal lesion. Sinuses/Orbits: No acute finding. Other: None. IMPRESSION: Chronic atrophic and ischemic changes without acute abnormality. Electronically Signed   By: Alcide Clever M.D.   On: 05/01/2018 17:13    ____________________________________________   PROCEDURES  Procedure(s) performed: None  Procedures  Critical Care performed: No ____________________________________________   INITIAL IMPRESSION / ASSESSMENT AND PLAN / ED COURSE  Pertinent labs & imaging results that were  available during my care of the patient were reviewed by me and considered in my medical decision making (see chart for details).  85 y.o. female with recent treatment for UTI presenting with altered mental status and generalized weakness.  Overall, if the patient's baseline is that she lives independently and cares for herself, she is drastically worse than her baseline.  I am awaiting the family member to arrive to give more details.  I am concerned about worsening UTI, but would also consider head trauma given the bruising on her face so CT has been ordered.  The patient will also get basic laboratory studies, and EKG and troponin.  A chest x-ray is also pending.  Plan reevaluation for final disposition.  ----------------------------------------- 4:58 PM on 05/01/2018 -----------------------------------------  Patient does have bacteriuria, without any other significant signs of infection.  A culture has been sent and the patient will receive Rocephin but it is unlikely that this very mild finding would be the cause of the patient's altered mental status.  ----------------------------------------- 6:12 PM on  05/01/2018 -----------------------------------------  The patient's laboratory studies show sodium of 109.  She is receiving intravenous fluids.  Is possible this is from decreased p.o. intake, the patient did not seem significantly dehydrated on my examination.  The patient has some mild renal insufficiency with a creatinine of 1.07.  She does have a troponin of 0.05, but has not been exhibiting signs of chest pain, and has an EKG which is reassuring.  An aspirin has been ordered but this number will need to be trended.  The patient CT head did not show any acute process and the chest x-ray is stable. ____________________________________________  FINAL CLINICAL IMPRESSION(S) / ED DIAGNOSES  Final diagnoses:  Bacteriuria  Hyponatremia  Altered mental status, unspecified altered mental status type         NEW MEDICATIONS STARTED DURING THIS VISIT:  New Prescriptions   No medications on file      Rockne Menghini, MD 05/01/18 1623    Rockne Menghini, MD 05/01/18 [phone]

## 2018-05-01 NOTE — ED Notes (Signed)
ED TO INPATIENT HANDOFF REPORT  ED Nurse Name and Phone #: Perry Mountriel Wallace [phone]  S Name/Age/Gender Christina Wise 85 y.o. female Room/Bed: ED24A/ED24A  Code Status   Code Status: Not on file  Home/SNF/Other Home Patient oriented to: self, place, time and situation Is this baseline? Yes   Triage Complete: Triage complete  Chief Complaint weakness  Triage Note Pt treated for a UTI with cipro one week ago- states she is not feeling any better- weakness starting yesterday   Allergies No Known Allergies  Level of Care/Admitting Diagnosis ED Disposition    ED Disposition Condition Comment   Admit  Hospital Area: [facility] [[id number]]  Level of Care: Stepdown [14]  Diagnosis: Hyponatremia [161096][198519]  Admitting Physician: Alford HighlandWIETING, RICHARD [[id number]]  Attending Physician: Alford HighlandWIETING, RICHARD [phone][[id number]]  Estimated length of stay: past midnight tomorrow  Certification:: I certify this patient will need inpatient services for at least 2 midnights  PT Class (Do Not Modify): Inpatient [101]  PT Acc Code (Do Not Modify): Private [1]       B Medical/Surgery History Past Medical History:  Diagnosis Date  . Hypertension   . Thyroid disease    History reviewed. No pertinent surgical history.   A IV Location/Drains/Wounds Patient Lines/Drains/Airways Status   Active Line/Drains/Airways    Name:   Placement date:   Placement time:   Site:   Days:   Peripheral IV 05/01/18 Right Antecubital   05/01/18    1631    Antecubital   less than 1          Intake/Output Last 24 hours  Intake/Output Summary (Last 24 hours) at 05/01/2018 1844 Last data filed at 05/01/2018 1821 Gross per 24 hour  Intake 1100 ml  Output -  Net 1100 ml    Labs/Imaging Results for orders placed or performed during the hospital encounter of 05/01/18 (from the past 48 hour(s))  CBC     Status: Abnormal   Collection Time: 05/01/18  4:11 PM  Result Value Ref Range   WBC 14.8 (H) 4.0  - 10.5 K/uL   RBC 4.43 3.87 - 5.11 MIL/uL   Hemoglobin 13.0 12.0 - 15.0 g/dL   HCT 78.234.8 (L) 95.636.0 - 21.346.0 %   MCV 78.6 (L) 80.0 - 100.0 fL   MCH 29.3 26.0 - 34.0 pg   MCHC 37.4 (H) 30.0 - 36.0 g/dL   RDW 08.613.0 57.811.5 - 46.915.5 %   Platelets 270 150 - 400 K/uL   nRBC 0.0 0.0 - 0.2 %    Comment: Performed at [facility], [address] [place], [place] [address]  Comprehensive metabolic panel     Status: Abnormal   Collection Time: 05/01/18  4:11 PM  Result Value Ref Range   Sodium 109 (LL) 135 - 145 mmol/L    Comment: CRITICAL RESULT CALLED TO, READ BACK BY AND VERIFIED WITH Rogene Meth WALLACE AT 1802 05/01/2018.PMF   Potassium 3.4 (L) 3.5 - 5.1 mmol/L   Chloride 73 (L) 98 - 111 mmol/L   CO2 22 22 - 32 mmol/L   Glucose, Bld 110 (H) 70 - 99 mg/dL   BUN 26 (H) 8 - 23 mg/dL   Creatinine, Ser 8.411.02 (H) 0.44 - 1.00 mg/dL   Calcium 8.6 (L) 8.9 - 10.3 mg/dL   Total Protein 6.2 (L) 6.5 - 8.1 g/dL   Albumin 4.2 3.5 - 5.0 g/dL   AST 38 15 - 41 U/L   ALT 18 0 - 44 U/L   Alkaline  Phosphatase 130 (H) 38 - 126 U/L   Total Bilirubin 1.9 (H) 0.3 - 1.2 mg/dL   GFR calc non Af Amer 47 (L) >60 mL/min   GFR calc Af Amer 55 (L) >60 mL/min   Anion gap 14 5 - 15    Comment: Performed at [facility], [address] [place], [place] [address]  Troponin I - ONCE - STAT     Status: Abnormal   Collection Time: 05/01/18  4:11 PM  Result Value Ref Range   Troponin I 0.05 (HH) <0.03 ng/mL    Comment: CRITICAL RESULT CALLED TO, READ BACK BY AND VERIFIED WITH Tyrin Herbers WALLACE AT 1802 05/01/2018.PMF Performed at [facility], [address] [place], [place] [address]   Urinalysis, Complete w Microscopic     Status: Abnormal   Collection Time: 05/01/18  4:16 PM  Result Value Ref Range   Color, Urine YELLOW (A) YELLOW   APPearance CLEAR (A) CLEAR   Specific Gravity, Urine 1.010 1.005 - 1.030   pH 7.0 5.0 - 8.0   Glucose, UA NEGATIVE NEGATIVE mg/dL   Hgb urine dipstick NEGATIVE NEGATIVE    Bilirubin Urine NEGATIVE NEGATIVE   Ketones, ur 5 (A) NEGATIVE mg/dL   Protein, ur NEGATIVE NEGATIVE mg/dL   Nitrite NEGATIVE NEGATIVE   Leukocytes,Ua NEGATIVE NEGATIVE   RBC / HPF 0-5 0 - 5 RBC/hpf   WBC, UA 0-5 0 - 5 WBC/hpf   Bacteria, UA RARE (A) NONE SEEN   Squamous Epithelial / LPF 0-5 0 - 5   Mucus PRESENT     Comment: Performed at [facility], [address] [place], [place] [address]  Glucose, capillary     Status: Abnormal   Collection Time: 05/01/18  4:35 PM  Result Value Ref Range   Glucose-Capillary 113 (H) 70 - 99 mg/dL   Dg Chest 2 View  Result Date: 05/01/2018 CLINICAL DATA:  Pt to ED for AMS. Pt is unable to describe what is wrong or give hx. Pt's relative states pt has increased weakness over the past couple of days and is not eating, also states pt was treated for a UTI with Cipro one week ago-states she is not feeling any better. History of hypertension. EXAM: CHEST - 2 VIEW COMPARISON:  RIGHT shoulder on 03/30/2018 FINDINGS: Shallow lung inflation. Heart size is accentuated by low lung volumes. There are no focal consolidations or pleural effusions. No pulmonary edema. Again noted is impacted RIGHT humerus fracture. IMPRESSION: 1. Shallow inflation. 2. No evidence for acute cardiopulmonary abnormality. 3. Stable appearance of RIGHT humerus fracture. Electronically Signed   By: Norva Pavlov M.D.   On: 05/01/2018 16:57   Ct Head Wo Contrast  Result Date: 05/01/2018 CLINICAL DATA:  Altered mental status and weakness EXAM: CT HEAD WITHOUT CONTRAST TECHNIQUE: Contiguous axial images were obtained from the base of the skull through the vertex without intravenous contrast. COMPARISON:  None. FINDINGS: Brain: Generalized atrophic and chronic ischemic changes are noted. No findings to suggest acute hemorrhage, acute infarction or space-occupying mass lesion are noted. Vascular: No hyperdense vessel or unexpected calcification. Skull: Normal. Negative for fracture or  focal lesion. Sinuses/Orbits: No acute finding. Other: None. IMPRESSION: Chronic atrophic and ischemic changes without acute abnormality. Electronically Signed   By: Alcide Clever M.D.   On: 05/01/2018 17:13    Pending Labs Unresulted Labs (From admission, onward)    Start     Ordered   05/01/18 1840  Ammonia  Once,   R  05/01/18 1840   05/01/18 1829  Osmolality, urine  Once,   STAT     05/01/18 1828   05/01/18 1829  Sodium, urine, random  Once,   STAT     05/01/18 1828   05/01/18 1659  Urine culture  ONCE - STAT,   STAT     05/01/18 1658          Vitals/Pain Today's Vitals   05/01/18 1630 05/01/18 1700 05/01/18 1730 05/01/18 1830  BP: (!) 139/59 119/64 (!) 151/59 (!) 146/65  Pulse:  (!) 59 (!) 59 (!) 52  Resp: 15 15 15 17   Temp:      TempSrc:      SpO2:  100% 99% 99%  Weight:      Height:      PainSc:        Isolation Precautions No active isolations  Medications Medications  0.9 %  sodium chloride infusion (has no administration in time range)  sodium chloride 0.9 % bolus 1,000 mL (0 mLs Intravenous Stopped 05/01/18 1821)  cefTRIAXone (ROCEPHIN) 1 g in sodium chloride 0.9 % 100 mL IVPB (0 g Intravenous Stopped 05/01/18 1801)    Mobility walks with device High fall risk   Focused Assessments Cardiac Assessment Handoff:    Lab Results  Component Value Date   TROPONINI 0.05 (HH) 05/01/2018   No results found for: DDIMER Does the Patient currently have chest pain? No     R Recommendations: See Admitting Provider Note  Report given to:   Additional Notes: pt normally walks with a cane but at this time is unambulatoty- pt is hard of hearing

## 2018-05-01 NOTE — Progress Notes (Signed)
Patient ID: Christina Wise, female   DOB: [birth year], 85 y.o.   MRN: [id number]  ACP note  Patient and son present  CODE STATUS discussed is a DNR  Diagnosis: Severe hyponatremia, hypertension, hypothyroidism and mild dementia  Patient coming in with worsening weakness and not eating or drinking very well.  She is unable to get up and walk at this time.  she does take hydrochlorothiazide at home.  She was found to have a low sodium of 109.  Time spent on ACP discussion 17 minutes Dr Alford Highland

## 2018-05-01 NOTE — ED Triage Notes (Signed)
Pt treated for a UTI with cipro one week ago- states she is not feeling any better- weakness starting yesterday

## 2018-05-01 NOTE — ED Notes (Signed)
Pt assisted to bedpan.  ?

## 2018-05-02 ENCOUNTER — Encounter: Payer: Self-pay | Admitting: Pulmonary Disease

## 2018-05-02 LAB — BASIC METABOLIC PANEL
Anion gap: 8 (ref 5–15)
BUN: 22 mg/dL (ref 8–23)
CALCIUM: 8.1 mg/dL — AB (ref 8.9–10.3)
CO2: 23 mmol/L (ref 22–32)
Chloride: 81 mmol/L — ABNORMAL LOW (ref 98–111)
Creatinine, Ser: 0.97 mg/dL (ref 0.44–1.00)
GFR calc non Af Amer: 50 mL/min — ABNORMAL LOW (ref >60)
GFR, EST AFRICAN AMERICAN: 58 mL/min — AB (ref >60)
Glucose, Bld: 82 mg/dL (ref 70–99)
Potassium: 3.3 mmol/L — ABNORMAL LOW (ref 3.5–5.1)
SODIUM: 112 mmol/L — AB (ref 135–145)

## 2018-05-02 LAB — SODIUM
Sodium: 113 mmol/L — CL (ref 135–145)
Sodium: 113 mmol/L — CL (ref 135–145)
Sodium: 113 mmol/L — CL (ref 135–145)
Sodium: 113 mmol/L — CL (ref 135–145)
Sodium: 115 mmol/L — CL (ref 135–145)

## 2018-05-02 LAB — CBC
HEMATOCRIT: 32.9 % — AB (ref 36.0–46.0)
Hemoglobin: 12 g/dL (ref 12.0–15.0)
MCH: 29.3 pg (ref 26.0–34.0)
MCHC: 36.5 g/dL — ABNORMAL HIGH (ref 30.0–36.0)
MCV: 80.2 fL (ref 80.0–100.0)
Platelets: 190 10*3/uL (ref 150–400)
RBC: 4.1 MIL/uL (ref 3.87–5.11)
RDW: 13.2 % (ref 11.5–15.5)
WBC: 8.6 10*3/uL (ref 4.0–10.5)
nRBC: 0 % (ref 0.0–0.2)

## 2018-05-02 LAB — TSH: TSH: 7.002 u[IU]/mL — ABNORMAL HIGH (ref 0.350–4.500)

## 2018-05-02 LAB — MRSA PCR SCREENING: MRSA by PCR: NEGATIVE

## 2018-05-02 LAB — AMMONIA: Ammonia: <9 umol/L — ABNORMAL LOW (ref 9–35)

## 2018-05-02 MED ORDER — ENOXAPARIN SODIUM 40 MG/0.4ML ~~LOC~~ SOLN
40.0000 mg | SUBCUTANEOUS | Status: DC
  Administered 2018-05-02 – 2018-05-05 (×4): 40 mg via SUBCUTANEOUS
  Filled 2018-05-02 (×4): qty 0.4

## 2018-05-02 MED ORDER — MAGNESIUM SULFATE 2 GM/50ML IV SOLN
2.0000 g | Freq: Once | INTRAVENOUS | Status: AC
  Administered 2018-05-02: 2 g via INTRAVENOUS
  Filled 2018-05-02: qty 50

## 2018-05-02 MED ORDER — CITALOPRAM HYDROBROMIDE 20 MG PO TABS
10.0000 mg | ORAL_TABLET | Freq: Every day | ORAL | Status: DC
  Administered 2018-05-02 – 2018-05-04 (×3): 10 mg via ORAL
  Filled 2018-05-02 (×3): qty 1

## 2018-05-02 NOTE — Progress Notes (Signed)
Pharmacy Lovenox Dosing  85 y.o. female admitted with Weakness . Patient ordered Lovenox 30 mg daily for VTE prophylaxis.   Filed Weights   05/01/18 1609  Weight: 128 lb (58.1 kg)    Body mass index is 21.97 kg/m.  Estimated Creatinine Clearance: 30.6 mL/min (by C-G formula based on SCr of 0.97 mg/dL).  Will adjust Lovenox dosing to 40 mg q24h   Christina Wise A 05/02/2018 1:28 PM

## 2018-05-02 NOTE — Progress Notes (Signed)
[facility] - [place] at [facility]   PATIENT NAME: Christina Wise    MR#:  [id number]  DATE OF BIRTH:  1922/01/29  SUBJECTIVE:  CHIEF COMPLAINT: Patient is still altered and has baseline mild dementia   REVIEW OF SYSTEMS:  Review of system unobtainable  DRUG ALLERGIES:   Allergies  Allergen Reactions  . Alendronate Other (See Comments)    GI upset  . Codeine Nausea And Vomiting  . Risedronate Other (See Comments)    GI Upset    VITALS:  Blood pressure (!) 112/45, pulse (!) 55, temperature (!) 97.5 F (36.4 C), temperature source Oral, resp. rate 13, height 5\' 4"  (1.626 m), weight 58.1 kg, SpO2 99 %.  PHYSICAL EXAMINATION:  GENERAL:  85 y.o.-year-old patient lying in the bed with no acute distress.  EYES: Pupils equal, round, reactive to light and accommodation. No scleral icterus. Extraocular muscles intact.  HEENT: Head atraumatic, normocephalic. Oropharynx and nasopharynx clear.  NECK:  Supple, no jugular venous distention. No thyroid enlargement, no tenderness.  LUNGS: Normal breath sounds bilaterally, no wheezing, rales,rhonchi or crepitation. No use of accessory muscles of respiration.  CARDIOVASCULAR: S1, S2 normal. No murmurs, rubs, or gallops.  ABDOMEN: Soft, nontender, nondistended. Bowel sounds present.  EXTREMITIES: No pedal edema, cyanosis, or clubbing.  NEUROLOGIC: Awake, alert and disoriented sensation intact. Gait not checked.  PSYCHIATRIC: The patient is alert and disoriented SKIN: No obvious rash, lesion, or ulcer.    LABORATORY PANEL:   CBC Recent Labs  Lab 05/02/18 0424  WBC 8.6  HGB 12.0  HCT 32.9*  PLT 190   ------------------------------------------------------------------------------------------------------------------  Chemistries  Recent Labs  Lab 05/01/18 1611 05/01/18 1954  05/02/18 0500  05/02/18 1137  NA 109* 112*   < > 112*   < > 113*  K 3.4*  --   --  3.3*  --   --   CL 73*  --   --  81*  --    --   CO2 22  --   --  23  --   --   GLUCOSE 110*  --   --  82  --   --   BUN 26*  --   --  22  --   --   CREATININE 1.02*  --   --  0.97  --   --   CALCIUM 8.6*  --   --  8.1*  --   --   MG  --  1.6*  --   --   --   --   AST 38  --   --   --   --   --   ALT 18  --   --   --   --   --   ALKPHOS 130*  --   --   --   --   --   BILITOT 1.9*  --   --   --   --   --    < > = values in this interval not displayed.   ------------------------------------------------------------------------------------------------------------------  Cardiac Enzymes Recent Labs  Lab 05/01/18 1611  TROPONINI 0.05*   ------------------------------------------------------------------------------------------------------------------  RADIOLOGY:  Dg Chest 2 View  Result Date: 05/01/2018 CLINICAL DATA:  Pt to ED for AMS. Pt is unable to describe what is wrong or give hx. Pt's relative states pt has increased weakness over the past couple of days and is not eating, also states pt was treated for a UTI with Cipro one week ago-states  she is not feeling any better. History of hypertension. EXAM: CHEST - 2 VIEW COMPARISON:  RIGHT shoulder on 03/30/2018 FINDINGS: Shallow lung inflation. Heart size is accentuated by low lung volumes. There are no focal consolidations or pleural effusions. No pulmonary edema. Again noted is impacted RIGHT humerus fracture. IMPRESSION: 1. Shallow inflation. 2. No evidence for acute cardiopulmonary abnormality. 3. Stable appearance of RIGHT humerus fracture. Electronically Signed   By: Norva Pavlov M.D.   On: 05/01/2018 16:57   Ct Head Wo Contrast  Result Date: 05/01/2018 CLINICAL DATA:  Altered mental status and weakness EXAM: CT HEAD WITHOUT CONTRAST TECHNIQUE: Contiguous axial images were obtained from the base of the skull through the vertex without intravenous contrast. COMPARISON:  None. FINDINGS: Brain: Generalized atrophic and chronic ischemic changes are noted. No findings to suggest  acute hemorrhage, acute infarction or space-occupying mass lesion are noted. Vascular: No hyperdense vessel or unexpected calcification. Skull: Normal. Negative for fracture or focal lesion. Sinuses/Orbits: No acute finding. Other: None. IMPRESSION: Chronic atrophic and ischemic changes without acute abnormality. Electronically Signed   By: Alcide Clever M.D.   On: 05/01/2018 17:13    EKG:   Orders placed or performed during the hospital encounter of 05/01/18  . ED EKG  . ED EKG    ASSESSMENT AND PLAN:    1.  Severe hyponatremia with weakness and acute metabolic encephalopathy with underlying dementia Sodium 109 at the time of admission currently at 112-113 Continue normal saline at 40 mL/h Serial sodium monitoring PT evaluation patient is clinically stable Nephrology is following appreciate their recommendations MRSA PCR negative  2.  Essential hypertension.  Stop hydrochlorothiazide this is not a good medication for her at this point in time.  Continue Norvasc.  3.  Hypothyroidism -TSH elevated at 7.002; patient is started on levothyroxine  4.  Mild dementia  5.  Hypokalemia and hypomagnesemia replace and recheck in a.m.      All the records are reviewed and case discussed with Care Management/Social Workerr. Management plans discussed with the patient Discussed with intensivist.  CODE STATUS:  DNR   TOTAL TIME TAKING CARE OF THIS PATIENT: 36 minutes.   POSSIBLE D/C IN  2-3  DAYS, DEPENDING ON CLINICAL CONDITION.  Note: This dictation was prepared with Dragon dictation along with smaller phrase technology. Any transcriptional errors that result from this process are unintentional.   Ramonita Lab M.D on 05/02/2018 at 12:51 PM  Between 7am to 6pm - Pager - [phone] After 6pm go to [email or web address] - password [email or web address] [facility]  [facility]  Office  [phone]  CC: Primary care physician; Kandyce Rud, MD

## 2018-05-02 NOTE — Progress Notes (Signed)
CRITICAL CARE NOTE      CHIEF COMPLAINT:  Altered mental status with confusion  t  SUBJECTIVE FINDINGS & SIGNIFICANT EVENTS    Resting in bed comfortably, +confusion.   PAST MEDICAL HISTORY   Past Medical History:  Diagnosis Date  . Hearing impairment   . Hypertension   . Mild dementia (HCC)   . Thyroid disease      SURGICAL HISTORY   Past Surgical History:  Procedure Laterality Date  . CHOLECYSTECTOMY       FAMILY HISTORY   Family History  Problem Relation Age of Onset  . Hypertension Mother      SOCIAL HISTORY   Social History   Tobacco Use  . Smoking status: Never Smoker  . Smokeless tobacco: Never Used  Substance Use Topics  . Alcohol use: Never    Frequency: Never  . Drug use: Never     MEDICATIONS   Current Medication:  Current Facility-Administered Medications:  .  0.9 %  sodium chloride infusion, , Intravenous, Continuous, Wieting, Richard, MD, Last Rate: 40 mL/hr at 05/01/18 2052 .  acetaminophen (TYLENOL) tablet 650 mg, 650 mg, Oral, Q6H PRN, 650 mg at 05/01/18 2147 **OR** acetaminophen (TYLENOL) suppository 650 mg, 650 mg, Rectal, Q6H PRN, Wieting, Richard, MD .  ALPRAZolam Prudy Feeler) tablet 0.25 mg, 0.25 mg, Oral, TID, Renae Gloss, Richard, MD, 0.25 mg at 05/02/18 1055 .  amLODipine (NORVASC) tablet 5 mg, 5 mg, Oral, Daily, Wieting, Richard, MD, 5 mg at 05/02/18 1053 .  aspirin EC tablet 81 mg, 81 mg, Oral, Daily, Alford Highland, MD, 81 mg at 05/02/18 1053 .  citalopram (CELEXA) tablet 10 mg, 10 mg, Oral, Daily, Tukov-Yual, Magdalene S, NP, 10 mg at 05/02/18 1053 .  enoxaparin (LOVENOX) injection 30 mg, 30 mg, Subcutaneous, Q24H, Nazari, Walid A, RPH, 30 mg at 05/01/18 2147 .  levothyroxine (SYNTHROID, LEVOTHROID) tablet 50 mcg, 50 mcg, Oral, Daily, Alford Highland,  MD, 50 mcg at 05/02/18 0543 .  montelukast (SINGULAIR) tablet 10 mg, 10 mg, Oral, QHS, Wieting, Richard, MD, 10 mg at 05/01/18 2147 .  ondansetron (ZOFRAN) tablet 4 mg, 4 mg, Oral, Q6H PRN **OR** ondansetron (ZOFRAN) injection 4 mg, 4 mg, Intravenous, Q6H PRN, Wieting, Richard, MD    ALLERGIES   Alendronate; Codeine; and Risedronate    REVIEW OF SYSTEMS   Unable to obtain due to confusion with baseline dementia  PHYSICAL EXAMINATION   Vitals:   05/02/18 0800 05/02/18 0900  BP:  (!) 112/45  Pulse:  (!) 55  Resp:  13  Temp: (!) 36.4 C   SpO2:      GENERAL: No apparent distress HEAD: Normocephalic, atraumatic.  EYES: Pupils equal, round, reactive to light.  No scleral icterus.  MOUTH: Moist mucosal membrane. NECK: Supple. No thyromegaly. No nodules. No JVD.  PULMONARY: Decreased breath sounds bilaterally no wheezing rhonchi or crackles CARDIOVASCULAR: S1 and S2. Regular rate and rhythm. No murmurs, rubs, or gallops.  GASTROINTESTINAL: Soft, nontender, non-distended. No masses. Positive bowel sounds. No hepatosplenomegaly.  MUSCULOSKELETAL: No swelling, clubbing, or edema.  NEUROLOGIC: Mild distress due to acute illness SKIN:intact,warm,dry   LABS AND IMAGING     -I personally reviewed most recent blood work, imaging and microbiology - significant findings today are hyponatremia  LAB RESULTS: Recent Labs  Lab 05/01/18 1611  05/02/18 0500 05/02/18 0754 05/02/18 1137  NA 109*   < > 112* 113* 113*  K 3.4*  --  3.3*  --   --   CL 73*  --  81*  --   --   CO2 22  --  23  --   --   BUN 26*  --  22  --   --   CREATININE 1.02*  --  0.97  --   --   GLUCOSE 110*  --  82  --   --    < > = values in this interval not displayed.   Recent Labs  Lab 05/01/18 1611 05/02/18 0424  HGB 13.0 12.0  HCT 34.8* 32.9*  WBC 14.8* 8.6  PLT 270 190     IMAGING RESULTS: Dg Chest 2 View  Result Date: 05/01/2018 CLINICAL DATA:  Pt to ED for AMS. Pt is unable to describe what is  wrong or give hx. Pt's relative states pt has increased weakness over the past couple of days and is not eating, also states pt was treated for a UTI with Cipro one week ago-states she is not feeling any better. History of hypertension. EXAM: CHEST - 2 VIEW COMPARISON:  RIGHT shoulder on 03/30/2018 FINDINGS: Shallow lung inflation. Heart size is accentuated by low lung volumes. There are no focal consolidations or pleural effusions. No pulmonary edema. Again noted is impacted RIGHT humerus fracture. IMPRESSION: 1. Shallow inflation. 2. No evidence for acute cardiopulmonary abnormality. 3. Stable appearance of RIGHT humerus fracture. Electronically Signed   By: Norva Pavlov M.D.   On: 05/01/2018 16:57   Ct Head Wo Contrast  Result Date: 05/01/2018 CLINICAL DATA:  Altered mental status and weakness EXAM: CT HEAD WITHOUT CONTRAST TECHNIQUE: Contiguous axial images were obtained from the base of the skull through the vertex without intravenous contrast. COMPARISON:  None. FINDINGS: Brain: Generalized atrophic and chronic ischemic changes are noted. No findings to suggest acute hemorrhage, acute infarction or space-occupying mass lesion are noted. Vascular: No hyperdense vessel or unexpected calcification. Skull: Normal. Negative for fracture or focal lesion. Sinuses/Orbits: No acute finding. Other: None. IMPRESSION: Chronic atrophic and ischemic changes without acute abnormality. Electronically Signed   By: Alcide Clever M.D.   On: 05/01/2018 17:13      ASSESSMENT AND PLAN    -Multidisciplinary rounds held today  Altered mental status with confusion Due to hyponatremia-likely due to SSRI and HCTZ and aspirin daily with low-sodium diet -Chronic hyponatremia-plan is to gently increase sodium by 6-65meq  per 24H -Sodium improved from 109-113-12 hours, will monitor sodium every 4 hours with plan to decrease NS to 40 mL's per hour   Hypokalemia   -Repletion in progress    Renal Failure-most likely  due to ATN Status post gentle rehydration with resolution of acute kidney injury stage I -follow chem 7 -follow UO -continue Foley Catheter-assess need daily   Demand ischemia with mildly elevated troponin -Likely stress-induced with concomitant acute kidney injury and resultant decreased GFR -No chest pain or arrhythmias  NEUROLOGY -Mild metabolic encephalopathy secondary to hyponatremia complicated by likely underlying dementia and advanced age   GI/Nutrition GI PROPHYLAXIS as indicated DIET-->TF's as tolerated Constipation protocol as indicated  ENDO - ICU hypoglycemic\Hyperglycemia protocol -check FSBS per protocol   ELECTROLYTES -follow labs as needed -replace as needed -pharmacy consultation   DVT/GI PRX ordered -SCDs  TRANSFUSIONS AS NEEDED MONITOR FSBS ASSESS the need for LABS as needed   Critical care provider statement:    Critical care time (minutes):  35   Critical care time was exclusive of:  Separately billable procedures and treating other patients   Critical care was necessary to treat or prevent imminent or  life-threatening deterioration of the following conditions:   Metabolic encephalopathy secondary to hyponatremia, demand ischemia, acute kidney injury, hypokalemia, multiple comorbid conditions   Critical care was time spent personally by me on the following activities:  Development of treatment plan with patient or surrogate, discussions with consultants, evaluation of patient's response to treatment, examination of patient, obtaining history from patient or surrogate, ordering and performing treatments and interventions, ordering and review of laboratory studies and re-evaluation of patient's condition.  I assumed direction of critical care for this patient from another provider in my specialty: no    This document was prepared using Dragon voice recognition software and may include unintentional dictation errors.    Vida Rigger, M.D.   Division of Pulmonary & Critical Care Medicine  [facility]

## 2018-05-02 NOTE — Consult Note (Signed)
[facility] CONSULT NOTE    Date: 05/02/2018                  Patient Name:  Christina Wise  MRN: [id number]  DOB: [birth year]  Age / Sex: 85 y.o., female         PCP: Kandyce Rud, MD                 Service Requesting Consult: Hospitalist                 Reason for Consult: hyponatremia            History of Present Illness: Patient is a 85 y.o. female with a PMHx of hearing impairment, hypertension, dementia, humeral fracture, who was admitted to [facility] on 05/01/2018 for evaluation of generalized weakness.  She was found to be profoundly hyponatremic with a serum sodium of 109.  On medication list from her primary care physician it appears that the patient was on HCTZ.  Patient also found to have hypomagnesemia.  Her p.o. intake has been diminished over the past several days as well.  Patient started on 0.9 normal saline and serum sodium is 113 this a.m.  Patient confused and unable to offer review of systems.   Medications: Outpatient medications: Medications Prior to Admission  Medication Sig Dispense Refill Last Dose  . ALPRAZolam (XANAX) 0.25 MG tablet Take 0.25 mg by mouth 3 (three) times daily.   05/01/2018 at 0800  . amLODipine (NORVASC) 5 MG tablet Take 5 mg by mouth daily.   05/01/2018 at 0800  . aspirin EC 81 MG tablet Take 81 mg by mouth daily.   Past Week at Unknown time  . citalopram (CELEXA) 20 MG tablet Take 20 mg by mouth daily.   05/01/2018 at 0800  . hydrochlorothiazide (HYDRODIURIL) 25 MG tablet Take 25 mg by mouth daily.   05/01/2018 at 0800  . levothyroxine (SYNTHROID, LEVOTHROID) 50 MCG tablet Take 50 mcg by mouth daily.   05/01/2018 at 0700  . losartan (COZAAR) 50 MG tablet Take 50 mg by mouth daily.   05/01/2018 at 0800  . montelukast (SINGULAIR) 10 MG tablet Take 10 mg by mouth at bedtime.   04/30/2018 at 2000  . Glucosamine-Chondroitin-MSM-D3 TABS Take 1 tablet by mouth daily.     . traMADol (ULTRAM) 50 MG tablet Take 1 tablet (50  mg total) by mouth every 12 (twelve) hours as needed for severe pain. 8 tablet 0 prn at prn    Current medications: Current Facility-Administered Medications  Medication Dose Route Frequency Provider Last Rate Last Dose  . 0.9 %  sodium chloride infusion   Intravenous Continuous Alford Highland, MD 40 mL/hr at 05/01/18 2052    . acetaminophen (TYLENOL) tablet 650 mg  650 mg Oral Q6H PRN Alford Highland, MD   650 mg at 05/01/18 2147   Or  . acetaminophen (TYLENOL) suppository 650 mg  650 mg Rectal Q6H PRN Alford Highland, MD      . ALPRAZolam Prudy Feeler) tablet 0.25 mg  0.25 mg Oral TID Alford Highland, MD   0.25 mg at 05/01/18 2147  . amLODipine (NORVASC) tablet 5 mg  5 mg Oral Daily Wieting, Richard, MD      . aspirin EC tablet 81 mg  81 mg Oral Daily Wieting, Richard, MD      . citalopram (CELEXA) tablet 10 mg  10 mg Oral Daily Tukov-Yual, Magdalene S, NP      . enoxaparin (LOVENOX) injection  30 mg  30 mg Subcutaneous Q24H Mila Merry A, RPH   30 mg at 05/01/18 2147  . levothyroxine (SYNTHROID, LEVOTHROID) tablet 50 mcg  50 mcg Oral Daily Alford Highland, MD   50 mcg at 05/02/18 0543  . montelukast (SINGULAIR) tablet 10 mg  10 mg Oral QHS Alford Highland, MD   10 mg at 05/01/18 2147  . ondansetron (ZOFRAN) tablet 4 mg  4 mg Oral Q6H PRN Wieting, Richard, MD       Or  . ondansetron (ZOFRAN) injection 4 mg  4 mg Intravenous Q6H PRN Alford Highland, MD          Allergies: Allergies  Allergen Reactions  . Alendronate Other (See Comments)    GI upset  . Codeine Nausea And Vomiting  . Risedronate Other (See Comments)    GI Upset      Past Medical History: Past Medical History:  Diagnosis Date  . Hearing impairment   . Hypertension   . Mild dementia (HCC)   . Thyroid disease      Past Surgical History: Past Surgical History:  Procedure Laterality Date  . CHOLECYSTECTOMY       Family History: Family History  Problem Relation Age of Onset  . Hypertension Mother       Social History: Social History   Socioeconomic History  . Marital status: Widowed    Spouse name: Not on file  . Number of children: Not on file  . Years of education: Not on file  . Highest education level: Not on file  Occupational History  . Not on file  Social Needs  . Financial resource strain: Not on file  . Food insecurity:    Worry: Not on file    Inability: Not on file  . Transportation needs:    Medical: Not on file    Non-medical: Not on file  Tobacco Use  . Smoking status: Never Smoker  . Smokeless tobacco: Never Used  Substance and Sexual Activity  . Alcohol use: Never    Frequency: Never  . Drug use: Never  . Sexual activity: Not on file  Lifestyle  . Physical activity:    Days per week: Not on file    Minutes per session: Not on file  . Stress: Not on file  Relationships  . Social connections:    Talks on phone: Not on file    Gets together: Not on file    Attends religious service: Not on file    Active member of club or organization: Not on file    Attends meetings of clubs or organizations: Not on file    Relationship status: Not on file  . Intimate partner violence:    Fear of current or ex partner: Not on file    Emotionally abused: Not on file    Physically abused: Not on file    Forced sexual activity: Not on file  Other Topics Concern  . Not on file  Social History Narrative  . Not on file     Review of Systems: Patient unable to offer as she is confused.  Vital Signs: Blood pressure (!) 112/45, pulse (!) 55, temperature (!) 97.5 F (36.4 C), temperature source Oral, resp. rate 13, height  (1.626 m), weight 58.1 kg, SpO2 99 %.  Weight trends: Filed Weights   05/01/18 1609  Weight: 58.1 kg    Physical Exam: General: NAD, laying in bed  Head: Normocephalic, atraumatic.  Eyes: Anicteric, EOMI  Nose: Mucous membranes  moist, not inflammed, nonerythematous.  Throat: Oropharynx nonerythematous, no exudate appreciated.    Neck: Supple, trachea midline.  Lungs:  Normal respiratory effort. Clear to auscultation BL without crackles or wheezes.  Heart: RRR. S1 and S2 normal without gallop, murmur, or rubs.  Abdomen:  BS normoactive. Soft, Nondistended, non-tender.  No masses or organomegaly.  Extremities: No pretibial edema.  Neurologic: Awake but confused  Skin: No visible rashes, scars.    Lab results: Basic Metabolic Panel: Recent Labs  Lab 05/01/18 1611 05/01/18 1954 05/02/18 0018 05/02/18 0500 05/02/18 0754  NA 109* 112* 113* 112* 113*  K 3.4*  --   --  3.3*  --   CL 73*  --   --  81*  --   CO2 22  --   --  23  --   GLUCOSE 110*  --   --  82  --   BUN 26*  --   --  22  --   CREATININE 1.02*  --   --  0.97  --   CALCIUM 8.6*  --   --  8.1*  --   MG  --  1.6*  --   --   --   PHOS  --  2.8  --   --   --     Liver Function Tests: Recent Labs  Lab 05/01/18 1611  AST 38  ALT 18  ALKPHOS 130*  BILITOT 1.9*  PROT 6.2*  ALBUMIN 4.2   No results for input(s): LIPASE, AMYLASE in the last 168 hours. Recent Labs  Lab 05/01/18 1954 05/02/18 0424  AMMONIA <9* <9*    CBC: Recent Labs  Lab 05/01/18 1611 05/02/18 0424  WBC 14.8* 8.6  HGB 13.0 12.0  HCT 34.8* 32.9*  MCV 78.6* 80.2  PLT 270 190    Cardiac Enzymes: Recent Labs  Lab 05/01/18 1611  TROPONINI 0.05*    BNP: Invalid input(s): POCBNP  CBG: Recent Labs  Lab 05/01/18 1635 05/01/18 2013  GLUCAP 113* 104*    Microbiology: Results for orders placed or performed during the hospital encounter of 05/01/18  MRSA PCR Screening     Status: None   Collection Time: 05/02/18  5:44 AM  Result Value Ref Range Status   MRSA by PCR NEGATIVE NEGATIVE Final    Comment:        The GeneXpert MRSA Assay (FDA approved for NASAL specimens only), is one component of a comprehensive MRSA colonization surveillance program. It is not intended to diagnose MRSA infection nor to guide or monitor treatment for MRSA  infections. Performed at [facility], [address] [place], [place] [address]     Coagulation Studies: No results for input(s): LABPROT, INR in the last 72 hours.  Urinalysis: Recent Labs    05/01/18 1616  COLORURINE YELLOW*  LABSPEC 1.010  PHURINE 7.0  GLUCOSEU NEGATIVE  HGBUR NEGATIVE  BILIRUBINUR NEGATIVE  KETONESUR 5*  PROTEINUR NEGATIVE  NITRITE NEGATIVE  LEUKOCYTESUR NEGATIVE      Imaging: Dg Chest 2 View  Result Date: 05/01/2018 CLINICAL DATA:  Pt to ED for AMS. Pt is unable to describe what is wrong or give hx. Pt's relative states pt has increased weakness over the past couple of days and is not eating, also states pt was treated for a UTI with Cipro one week ago-states she is not feeling any better. History of hypertension. EXAM: CHEST - 2 VIEW COMPARISON:  RIGHT shoulder on 03/30/2018 FINDINGS: Shallow lung inflation. Heart size is  accentuated by low lung volumes. There are no focal consolidations or pleural effusions. No pulmonary edema. Again noted is impacted RIGHT humerus fracture. IMPRESSION: 1. Shallow inflation. 2. No evidence for acute cardiopulmonary abnormality. 3. Stable appearance of RIGHT humerus fracture. Electronically Signed   By: Norva Pavlov M.D.   On: 05/01/2018 16:57   Ct Head Wo Contrast  Result Date: 05/01/2018 CLINICAL DATA:  Altered mental status and weakness EXAM: CT HEAD WITHOUT CONTRAST TECHNIQUE: Contiguous axial images were obtained from the base of the skull through the vertex without intravenous contrast. COMPARISON:  None. FINDINGS: Brain: Generalized atrophic and chronic ischemic changes are noted. No findings to suggest acute hemorrhage, acute infarction or space-occupying mass lesion are noted. Vascular: No hyperdense vessel or unexpected calcification. Skull: Normal. Negative for fracture or focal lesion. Sinuses/Orbits: No acute finding. Other: None. IMPRESSION: Chronic atrophic and ischemic changes without acute  abnormality. Electronically Signed   By: Alcide Clever M.D.   On: 05/01/2018 17:13      Assessment & Plan: Pt is a 85 y.o. female with a PMHx of hearing impairment, hypertension, dementia, humeral fracture, hypothyroidism, who was admitted to [facility] on 05/01/2018 for evaluation of generalized weakness.   1.  Severe hyponatremia, admitting sodium 109. 2.  Hypothyroidism TSH 7.002. 3.  Hypertension.  Plan: Patient presents with severe hyponatremia.  She apparently was on HCTZ previously and is also had poor p.o. intake over the past several days.  Continue 0.9 normal saline at 40 cc/h and continue to monitor serum sodium frequently.  Consider starting on low-dose Synthroid but defer this to hospitalist.  If serum sodium plateaus we may need to consider stopping citalopram as well.  However given the suspicion of volume depletion at this time continue 0.9 normal saline.  Further plan as patient progresses.

## 2018-05-03 DIAGNOSIS — E43 Unspecified severe protein-calorie malnutrition: Secondary | ICD-10-CM

## 2018-05-03 LAB — SODIUM
SODIUM: 117 mmol/L — AB (ref 135–145)
Sodium: 114 mmol/L — CL (ref 135–145)
Sodium: 115 mmol/L — CL (ref 135–145)
Sodium: 116 mmol/L — CL (ref 135–145)
Sodium: 118 mmol/L — CL (ref 135–145)
Sodium: 120 mmol/L — ABNORMAL LOW (ref 135–145)
Sodium: 123 mmol/L — ABNORMAL LOW (ref 135–145)

## 2018-05-03 LAB — URINE CULTURE: Culture: NO GROWTH

## 2018-05-03 LAB — SODIUM, URINE, RANDOM: Sodium, Ur: 16 mmol/L

## 2018-05-03 LAB — OSMOLALITY, URINE: Osmolality, Ur: 164 mOsm/kg — ABNORMAL LOW (ref 300–900)

## 2018-05-03 LAB — OSMOLALITY: Osmolality: 247 mOsm/kg — CL (ref 275–295)

## 2018-05-03 LAB — MAGNESIUM: Magnesium: 2.1 mg/dL (ref 1.7–2.4)

## 2018-05-03 MED ORDER — SODIUM CHLORIDE 3 % IV SOLN
INTRAVENOUS | Status: DC
  Administered 2018-05-03: 30 mL/h via INTRAVENOUS
  Filled 2018-05-03 (×2): qty 500

## 2018-05-03 MED ORDER — ENSURE ENLIVE PO LIQD
237.0000 mL | Freq: Two times a day (BID) | ORAL | Status: DC
  Administered 2018-05-03 – 2018-05-06 (×5): 237 mL via ORAL

## 2018-05-03 NOTE — Progress Notes (Signed)
[facility]  ROUNDING NOTE   Subjective:  Serum sodium currently 116. Patient transition to 3% saline today. Potassium also a bit low at 3.3.   Objective:  Vital signs in last 24 hours:  Temp:  [97.7 F (36.5 C)] 97.7 F (36.5 C) (03/21 2258) Pulse Rate:  [51-53] 51 (03/22 0729) Resp:  [16-17] 17 (03/21 2258) BP: (104-107)/(48-55) 107/48 (03/22 0729) SpO2:  [98 %-100 %] 98 % (03/22 0729)  Weight change:  Filed Weights   05/01/18 1609  Weight: 58.1 kg    Intake/Output: I/O last 3 completed shifts: In: 805.3 [I.V.:805.3] Out: 2300 [Urine:2300]   Intake/Output this shift:  Total I/O In: -  Out: 700 [Urine:700]  Physical Exam: General: No acute distress  Head: Normocephalic, atraumatic. Moist oral mucosal membranes  Eyes: Anicteric  Neck: Supple, trachea midline  Lungs:  Clear to auscultation, normal effort  Heart: S1S2 no rubs  Abdomen:  Soft, nontender, bowel sounds present  Extremities: No peripheral edema.  Neurologic: Awake, alert, but confused  Skin: No lesions       Basic Metabolic Panel: Recent Labs  Lab 05/01/18 1611 05/01/18 1954  05/02/18 0500  05/02/18 2001 05/03/18 0044 05/03/18 0504 05/03/18 0744 05/03/18 1223  NA 109* 112*   < > 112*   < > 115* 114* 117* 115* 116*  K 3.4*  --   --  3.3*  --   --   --   --   --   --   CL 73*  --   --  81*  --   --   --   --   --   --   CO2 22  --   --  23  --   --   --   --   --   --   GLUCOSE 110*  --   --  82  --   --   --   --   --   --   BUN 26*  --   --  22  --   --   --   --   --   --   CREATININE 1.02*  --   --  0.97  --   --   --   --   --   --   CALCIUM 8.6*  --   --  8.1*  --   --   --   --   --   --   MG  --  1.6*  --   --   --   --   --  2.1  --   --   PHOS  --  2.8  --   --   --   --   --   --   --   --    < > = values in this interval not displayed.    Liver Function Tests: Recent Labs  Lab 05/01/18 1611  AST 38  ALT 18  ALKPHOS 130*  BILITOT 1.9*  PROT 6.2*   ALBUMIN 4.2   No results for input(s): LIPASE, AMYLASE in the last 168 hours. Recent Labs  Lab 05/01/18 1954 05/02/18 0424  AMMONIA <9* <9*    CBC: Recent Labs  Lab 05/01/18 1611 05/02/18 0424  WBC 14.8* 8.6  HGB 13.0 12.0  HCT 34.8* 32.9*  MCV 78.6* 80.2  PLT 270 190    Cardiac Enzymes: Recent Labs  Lab 05/01/18 1611  TROPONINI 0.05*    BNP: Invalid  input(s): POCBNP  CBG: Recent Labs  Lab 05/01/18 1635 05/01/18 2013  GLUCAP 113* 104*    Microbiology: Results for orders placed or performed during the hospital encounter of 05/01/18  Urine culture     Status: None   Collection Time: 05/01/18  4:16 PM  Result Value Ref Range Status   Specimen Description   Final    URINE, RANDOM Performed at [facility], [address], [place], [place] [address]    Special Requests   Final    NONE Performed at [facility], [address], [place], [place] [address]    Culture   Final    NO GROWTH Performed at [facility], [address], [place], [place] [address]    Report Status 05/03/2018 FINAL  Final  MRSA PCR Screening     Status: None   Collection Time: 05/02/18  5:44 AM  Result Value Ref Range Status   MRSA by PCR NEGATIVE NEGATIVE Final    Comment:        The GeneXpert MRSA Assay (FDA approved for NASAL specimens only), is one component of a comprehensive MRSA colonization surveillance program. It is not intended to diagnose MRSA infection nor to guide or monitor treatment for MRSA infections. Performed at [facility], [address] [place], [place] [address]     Coagulation Studies: No results for input(s): LABPROT, INR in the last 72 hours.  Urinalysis: Recent Labs    05/01/18 1616  COLORURINE YELLOW*  LABSPEC 1.010  PHURINE 7.0  GLUCOSEU NEGATIVE  HGBUR NEGATIVE  BILIRUBINUR NEGATIVE  KETONESUR 5*  PROTEINUR NEGATIVE  NITRITE NEGATIVE  LEUKOCYTESUR NEGATIVE      Imaging: Dg Chest 2  View  Result Date: 05/01/2018 CLINICAL DATA:  Pt to ED for AMS. Pt is unable to describe what is wrong or give hx. Pt's relative states pt has increased weakness over the past couple of days and is not eating, also states pt was treated for a UTI with Cipro one week ago-states she is not feeling any better. History of hypertension. EXAM: CHEST - 2 VIEW COMPARISON:  RIGHT shoulder on 03/30/2018 FINDINGS: Shallow lung inflation. Heart size is accentuated by low lung volumes. There are no focal consolidations or pleural effusions. No pulmonary edema. Again noted is impacted RIGHT humerus fracture. IMPRESSION: 1. Shallow inflation. 2. No evidence for acute cardiopulmonary abnormality. 3. Stable appearance of RIGHT humerus fracture. Electronically Signed   By: Norva Pavlov M.D.   On: 05/01/2018 16:57   Ct Head Wo Contrast  Result Date: 05/01/2018 CLINICAL DATA:  Altered mental status and weakness EXAM: CT HEAD WITHOUT CONTRAST TECHNIQUE: Contiguous axial images were obtained from the base of the skull through the vertex without intravenous contrast. COMPARISON:  None. FINDINGS: Brain: Generalized atrophic and chronic ischemic changes are noted. No findings to suggest acute hemorrhage, acute infarction or space-occupying mass lesion are noted. Vascular: No hyperdense vessel or unexpected calcification. Skull: Normal. Negative for fracture or focal lesion. Sinuses/Orbits: No acute finding. Other: None. IMPRESSION: Chronic atrophic and ischemic changes without acute abnormality. Electronically Signed   By: Alcide Clever M.D.   On: 05/01/2018 17:13     Medications:   . sodium chloride (hypertonic) 30 mL/hr (05/03/18 1220)   . ALPRAZolam  0.25 mg Oral TID  . amLODipine  5 mg Oral Daily  . aspirin EC  81 mg Oral Daily  . citalopram  10 mg Oral Daily  . enoxaparin (LOVENOX) injection  40 mg  Subcutaneous Q24H  . feeding supplement (ENSURE ENLIVE)  237 mL Oral BID BM  . levothyroxine  50 mcg Oral Daily  .  montelukast  10 mg Oral QHS   acetaminophen **OR** acetaminophen, ondansetron **OR** ondansetron (ZOFRAN) IV  Assessment/ Plan:  85 y.o. female with a PMHx of hearing impairment, hypertension, dementia, humeral fracture, hypothyroidism, who was admitted to [facility] on 05/01/2018 for evaluation of generalized weakness.   1.  Severe hyponatremia, admitting sodium 109. 2.  Hypothyroidism TSH 7.002. 3.  Hypertension.  Plan: Patient has ongoing hyponatremia.  Serum sodium currently 116.  We will discontinue 0.9 normal saline and continue the patient on 3% saline at this time at 30 cc/h.  Check serum sodium frequently as ordered.  Patient will be maintained on amlodipine 5 mg daily for blood pressure control.  Further plan as patient progresses.   LOS: 2 Layali Freund 3/22/20201:04 PM

## 2018-05-03 NOTE — Progress Notes (Signed)
MEDICATION RELATED CONSULT NOTE - INITIAL   Pharmacy Consult for Monitoring Na Levels Indication: Hypertonic NaCl 3%   Allergies  Allergen Reactions  . Alendronate Other (See Comments)    GI upset  . Codeine Nausea And Vomiting  . Risedronate Other (See Comments)    GI Upset    Patient Measurements: Height: 5\' 4"  (162.6 cm) Weight: 128 lb (58.1 kg) IBW/kg (Calculated) : 54.7 Adjusted Body Weight:   Vital Signs: BP: 107/48 (03/22 0729) Pulse Rate: 51 (03/22 0729) Intake/Output from previous day: 03/21 0701 - 03/22 0700 In: 805.3 [I.V.:805.3] Out: 1050 [Urine:1050] Intake/Output from this shift: Total I/O In: -  Out: 700 [Urine:700]  Labs: BMP Latest Ref Rng & Units 05/03/2018 05/03/2018 05/03/2018  Glucose 70 - 99 mg/dL - - -  BUN 8 - 23 mg/dL - - -  Creatinine 8.46 - 1.00 mg/dL - - -  Sodium 659 - 935 mmol/L 116(LL) 115(LL) 117(LL)  Potassium 3.5 - 5.1 mmol/L - - -  Chloride 98 - 111 mmol/L - - -  CO2 22 - 32 mmol/L - - -  Calcium 8.9 - 10.3 mg/dL - - -    Estimated Creatinine Clearance: 30.6 mL/min (by C-G formula based on SCr of 0.97 mg/dL).  Medical History: Past Medical History:  Diagnosis Date  . Hearing impairment   . Hypertension   . Mild dementia (HCC)   . Thyroid disease    Assessment: Patient is a 85yo female admitted with severe hyponatremia. Patient is being initiated on NaCl 3% at 61ml/hr. Pharmacy is to monitor Na levels and f/u with provider for rapid changes in Na level.  3/22 12:23 Na 116, NaCl 3% started  Plan:  Next Na level will be in 2 hours then q4h. RPh to evaluate Na level and contact MD if Na rises > in 2hr or > over 4 hours. May need to prompt lab/nursing to make sure levels are checked q4h.  Clovia Cuff, PharmD, BCPS 05/03/2018 1:37 PM

## 2018-05-03 NOTE — Progress Notes (Signed)
MEDICATION RELATED CONSULT NOTE - INITIAL   Pharmacy Consult for Monitoring Na Levels Indication: Hypertonic NaCl 3%   Allergies  Allergen Reactions  . Alendronate Other (See Comments)    GI upset  . Codeine Nausea And Vomiting  . Risedronate Other (See Comments)    GI Upset    Patient Measurements: Height: 5\' 4"  (162.6 cm) Weight: 128 lb (58.1 kg) IBW/kg (Calculated) : 54.7 Adjusted Body Weight:   Vital Signs: BP: 110/61 (03/22 1536) Pulse Rate: 53 (03/22 1536) Intake/Output from previous day: 03/21 0701 - 03/22 0700 In: 805.3 [I.V.:805.3] Out: 1050 [Urine:1050] Intake/Output from this shift: No intake/output data recorded.  Labs: BMP Latest Ref Rng & Units 05/03/2018 05/03/2018 05/03/2018  Glucose 70 - 99 mg/dL - - -  BUN 8 - 23 mg/dL - - -  Creatinine 4.65 - 1.00 mg/dL - - -  Sodium 035 - 465 mmol/L 123(L) 120(L) 118(LL)  Potassium 3.5 - 5.1 mmol/L - - -  Chloride 98 - 111 mmol/L - - -  CO2 22 - 32 mmol/L - - -  Calcium 8.9 - 10.3 mg/dL - - -    Estimated Creatinine Clearance: 30.6 mL/min (by C-G formula based on SCr of 0.97 mg/dL).  Medical History: Past Medical History:  Diagnosis Date  . Hearing impairment   . Hypertension   . Mild dementia (HCC)   . Thyroid disease    Assessment: Patient is a 85yo female admitted with severe hyponatremia. Patient is being initiated on NaCl 3% at 62ml/hr. Pharmacy is to monitor Na levels and f/u with provider for rapid changes in Na level.  3/22 12:23 Na 116, NaCl 3% started 3/22 14:04 Na 118 3/22 16:17 Na 120  3/22 20:39 Na 123   Plan:  Next Na level will be in 2 hours then q4h. RPh to evaluate Na level and contact MD if Na rises > in 2hr or > over 4 hours. May need to prompt lab/nursing to make sure levels are checked q4h.  Aniesha Haughn D 05/03/2018 9:11 PM

## 2018-05-03 NOTE — Progress Notes (Signed)
MEDICATION RELATED CONSULT NOTE - INITIAL   Pharmacy Consult for Monitoring Na Levels Indication: Hypertonic NaCl 3%   Allergies  Allergen Reactions  . Alendronate Other (See Comments)    GI upset  . Codeine Nausea And Vomiting  . Risedronate Other (See Comments)    GI Upset    Patient Measurements: Height: 5\' 4"  (162.6 cm) Weight: 128 lb (58.1 kg) IBW/kg (Calculated) : 54.7 Adjusted Body Weight:   Vital Signs: BP: 110/61 (03/22 1536) Pulse Rate: 53 (03/22 1536) Intake/Output from previous day: 03/21 0701 - 03/22 0700 In: 805.3 [I.V.:805.3] Out: 1050 [Urine:1050] Intake/Output from this shift: No intake/output data recorded.  Labs: BMP Latest Ref Rng & Units 05/03/2018 05/03/2018 05/03/2018  Glucose 70 - 99 mg/dL - - -  BUN 8 - 23 mg/dL - - -  Creatinine 5.73 - 1.00 mg/dL - - -  Sodium 220 - 254 mmol/L 120(L) 118(LL) 116(LL)  Potassium 3.5 - 5.1 mmol/L - - -  Chloride 98 - 111 mmol/L - - -  CO2 22 - 32 mmol/L - - -  Calcium 8.9 - 10.3 mg/dL - - -    Estimated Creatinine Clearance: 30.6 mL/min (by C-G formula based on SCr of 0.97 mg/dL).  Medical History: Past Medical History:  Diagnosis Date  . Hearing impairment   . Hypertension   . Mild dementia (HCC)   . Thyroid disease    Assessment: Patient is a 85yo female admitted with severe hyponatremia. Patient is being initiated on NaCl 3% at 22ml/hr. Pharmacy is to monitor Na levels and f/u with provider for rapid changes in Na level.  3/22 12:23 Na 116, NaCl 3% started 3/22 14:04 Na 118 3/22 16:17 Na 120   Plan:  Next Na level will be in 2 hours then q4h. RPh to evaluate Na level and contact MD if Na rises > in 2hr or > over 4 hours. May need to prompt lab/nursing to make sure levels are checked q4h.  Crist Kruszka D 05/03/2018 7:58 PM

## 2018-05-03 NOTE — Progress Notes (Signed)
MEDICATION RELATED CONSULT NOTE - INITIAL   Pharmacy Consult for Monitoring Na Levels Indication: Hypertonic NaCl 3%   Allergies  Allergen Reactions  . Alendronate Other (See Comments)    GI upset  . Codeine Nausea And Vomiting  . Risedronate Other (See Comments)    GI Upset    Patient Measurements: Height: 5\' 4"  (162.6 cm) Weight: 128 lb (58.1 kg) IBW/kg (Calculated) : 54.7 Adjusted Body Weight:   Vital Signs: BP: 107/48 (03/22 0729) Pulse Rate: 51 (03/22 0729) Intake/Output from previous day: 03/21 0701 - 03/22 0700 In: 805.3 [I.V.:805.3] Out: 1050 [Urine:1050] Intake/Output from this shift: Total I/O In: -  Out: 700 [Urine:700]  Labs: BMP Latest Ref Rng & Units 05/03/2018 05/03/2018 05/03/2018  Glucose 70 - 99 mg/dL - - -  BUN 8 - 23 mg/dL - - -  Creatinine 8.91 - 1.00 mg/dL - - -  Sodium 694 - 503 mmol/L 118(LL) 116(LL) 115(LL)  Potassium 3.5 - 5.1 mmol/L - - -  Chloride 98 - 111 mmol/L - - -  CO2 22 - 32 mmol/L - - -  Calcium 8.9 - 10.3 mg/dL - - -    Estimated Creatinine Clearance: 30.6 mL/min (by C-G formula based on SCr of 0.97 mg/dL).  Medical History: Past Medical History:  Diagnosis Date  . Hearing impairment   . Hypertension   . Mild dementia (HCC)   . Thyroid disease    Assessment: Patient is a 85yo female admitted with severe hyponatremia. Patient is being initiated on NaCl 3% at 66ml/hr. Pharmacy is to monitor Na levels and f/u with provider for rapid changes in Na level.  3/22 12:23 Na 116, NaCl 3% started 3/22 14:04 Na 118  Plan:  Next Na level will be in 2 hours then q4h. RPh to evaluate Na level and contact MD if Na rises > in 2hr or > over 4 hours. May need to prompt lab/nursing to make sure levels are checked q4h.  Clovia Cuff, PharmD, BCPS 05/03/2018 2:29 PM

## 2018-05-03 NOTE — Progress Notes (Signed)
[facility] [place] at [facility]   PATIENT NAME: Christina Wise    MR#:  [id number]  DATE OF BIRTH:  30-Jun-1921  SUBJECTIVE:  CHIEF COMPLAINT: Patient is still altered and has baseline mild dementia  Weak and alert.  No family at bedside  REVIEW OF SYSTEMS:  Review of system unobtainable  DRUG ALLERGIES:   Allergies  Allergen Reactions  . Alendronate Other (See Comments)    GI upset  . Codeine Nausea And Vomiting  . Risedronate Other (See Comments)    GI Upset    VITALS:  Blood pressure (!) 107/48, pulse (!) 51, temperature 97.7 F (36.5 C), temperature source Oral, resp. rate 17, height 5\' 4"  (1.626 m), weight 58.1 kg, SpO2 98 %.  PHYSICAL EXAMINATION:  GENERAL:  85 y.o.-year-old patient lying in the bed with no acute distress.  EYES: Pupils equal, round, reactive to light and accommodation. No scleral icterus. Extraocular muscles intact.  HEENT: Head atraumatic, normocephalic. Oropharynx and nasopharynx clear.  NECK:  Supple, no jugular venous distention. No thyroid enlargement, no tenderness.  LUNGS: Normal breath sounds bilaterally, no wheezing, rales,rhonchi or crepitation. No use of accessory muscles of respiration.  CARDIOVASCULAR: S1, S2 normal. No murmurs, rubs, or gallops.  ABDOMEN: Soft, nontender, nondistended. Bowel sounds present.  EXTREMITIES: No pedal edema, cyanosis, or clubbing.  NEUROLOGIC: Awake, alert and disoriented sensation intact. Gait not checked.  PSYCHIATRIC: The patient is alert and disoriented SKIN: No obvious rash, lesion, or ulcer.    LABORATORY PANEL:   CBC Recent Labs  Lab 05/02/18 0424  WBC 8.6  HGB 12.0  HCT 32.9*  PLT 190   ------------------------------------------------------------------------------------------------------------------  Chemistries  Recent Labs  Lab 05/01/18 1611  05/02/18 0500  05/03/18 0504  05/03/18 1223  NA 109*   < > 112*   < > 117*   < > 116*  K 3.4*  --  3.3*   --   --   --   --   CL 73*  --  81*  --   --   --   --   CO2 22  --  23  --   --   --   --   GLUCOSE 110*  --  82  --   --   --   --   BUN 26*  --  22  --   --   --   --   CREATININE 1.02*  --  0.97  --   --   --   --   CALCIUM 8.6*  --  8.1*  --   --   --   --   MG  --    < >  --   --  2.1  --   --   AST 38  --   --   --   --   --   --   ALT 18  --   --   --   --   --   --   ALKPHOS 130*  --   --   --   --   --   --   BILITOT 1.9*  --   --   --   --   --   --    < > = values in this interval not displayed.   ------------------------------------------------------------------------------------------------------------------  Cardiac Enzymes Recent Labs  Lab 05/01/18 1611  TROPONINI 0.05*   ------------------------------------------------------------------------------------------------------------------  RADIOLOGY:  Dg Chest 2 View  Result Date: 05/01/2018  CLINICAL DATA:  Pt to ED for AMS. Pt is unable to describe what is wrong or give hx. Pt's relative states pt has increased weakness over the past couple of days and is not eating, also states pt was treated for a UTI with Cipro one week ago-states she is not feeling any better. History of hypertension. EXAM: CHEST - 2 VIEW COMPARISON:  RIGHT shoulder on 03/30/2018 FINDINGS: Shallow lung inflation. Heart size is accentuated by low lung volumes. There are no focal consolidations or pleural effusions. No pulmonary edema. Again noted is impacted RIGHT humerus fracture. IMPRESSION: 1. Shallow inflation. 2. No evidence for acute cardiopulmonary abnormality. 3. Stable appearance of RIGHT humerus fracture. Electronically Signed   By: Norva Pavlov M.D.   On: 05/01/2018 16:57   Ct Head Wo Contrast  Result Date: 05/01/2018 CLINICAL DATA:  Altered mental status and weakness EXAM: CT HEAD WITHOUT CONTRAST TECHNIQUE: Contiguous axial images were obtained from the base of the skull through the vertex without intravenous contrast. COMPARISON:   None. FINDINGS: Brain: Generalized atrophic and chronic ischemic changes are noted. No findings to suggest acute hemorrhage, acute infarction or space-occupying mass lesion are noted. Vascular: No hyperdense vessel or unexpected calcification. Skull: Normal. Negative for fracture or focal lesion. Sinuses/Orbits: No acute finding. Other: None. IMPRESSION: Chronic atrophic and ischemic changes without acute abnormality. Electronically Signed   By: Alcide Clever M.D.   On: 05/01/2018 17:13    EKG:   Orders placed or performed during the hospital encounter of 05/01/18  . ED EKG  . ED EKG    ASSESSMENT AND PLAN:    1.  Severe hyponatremia with weakness and acute metabolic encephalopathy with underlying dementia Sodium 109 at the time of admission currently at [phone] normal saline changed to 3% saline at 40 mL/h Serial sodium monitoring PT evaluation patient is clinically stable Nephrology is following appreciate their recommendations MRSA PCR negative  2.  Essential hypertension.  Stop hydrochlorothiazide this is not a good medication for her at this point in time.  Continue Norvasc.  3.  Hypothyroidism -TSH elevated at 7.002; patient is started on levothyroxine  4.  Severe protein energy malnutrition-seen by dietitian continue nutrition supplements  5.  Hypokalemia and hypomagnesemia replace and recheck in a.m.  6.  Mild dementia    All the records are reviewed and case discussed with Care Management/Social Workerr. Management plans discussed with the patient Discussed with intensivist.  CODE STATUS:  DNR   TOTAL TIME TAKING CARE OF THIS PATIENT: 36 minutes.   POSSIBLE D/C IN  2-3  DAYS, DEPENDING ON CLINICAL CONDITION.  Note: This dictation was prepared with Dragon dictation along with smaller phrase technology. Any transcriptional errors that result from this process are unintentional.   Ramonita Lab M.D on 05/03/2018 at 1:07 PM  Between 7am to 6pm - Pager -  [phone] After 6pm go to [email or web address] - password [email or web address] [facility]  [facility]  Office  [phone]  CC: Primary care physician; Kandyce Rud, MD

## 2018-05-03 NOTE — Progress Notes (Signed)
Initial Nutrition Assessment  DOCUMENTATION CODES:   Severe malnutrition in context of social or environmental circumstances  INTERVENTION:  Provide Ensure Enlive po BID, each supplement provides 350 kcal and 20 grams of protein.  Provide Magic cup TID with meals, each supplement provides 290 kcal and 9 grams of protein.  NUTRITION DIAGNOSIS:   Severe Malnutrition related to social / environmental circumstances(advanced age, inadequate oral intake) as evidenced by severe fat depletion, moderate-severe muscle depletion.  GOAL:   Patient will meet greater than or equal to 90% of their needs  MONITOR:   PO intake, Supplement acceptance, Labs, Weight trends, I & O's  REASON FOR ASSESSMENT:   Malnutrition Screening Tool    ASSESSMENT:   85 year old female with PMHx of dementia, hearing impairment, hypothyroidism, HTN admitted with severe hyponatremia, weakness, acute metabolic encephalopathy.   Met with patient at bedside. She is confused and unable to provide any history. Per RN patient with poor PO intake, only taking bites/sips. Patient having trouble swallowing PO medications. No weight history in chart to trend.  Medications reviewed and include: Xanax, levothyroxine, hypertonic saline.  Labs reviewed: Sodium 155.  NUTRITION - FOCUSED PHYSICAL EXAM:    Most Recent Value  Orbital Region  Severe depletion  Upper Arm Region  Severe depletion  Thoracic and Lumbar Region  Moderate depletion  Buccal Region  Severe depletion  Temple Region  Severe depletion  Clavicle Bone Region  Severe depletion  Clavicle and Acromion Bone Region  Severe depletion  Scapular Bone Region  Moderate depletion  Dorsal Hand  Severe depletion  Patellar Region  Moderate depletion  Anterior Thigh Region  Moderate depletion  Posterior Calf Region  Severe depletion  Edema (RD Assessment)  None  Hair  Reviewed  Eyes  Reviewed  Mouth  Unable to assess  Skin  Reviewed  Nails  Reviewed      Diet Order:   Diet Order            Diet regular Room service appropriate? Yes; Fluid consistency: Thin  Diet effective now             EDUCATION NEEDS:   Not appropriate for education at this time  Skin:  Skin Assessment: Reviewed RN Assessment(ecchymosis)  Last BM:  Unknown/PTA  Height:   Ht Readings from Last 1 Encounters:  05/01/18 '5\' 4"'  (1.626 m)   Weight:   Wt Readings from Last 1 Encounters:  05/01/18 58.1 kg   Ideal Body Weight:  54.5 kg  BMI:  Body mass index is 21.97 kg/m.  Estimated Nutritional Needs:   Kcal:  1200-1400  Protein:  60-70 grams  Fluid:  1.2-1.4 L/day  Willey Blade, MS, RD, LDN Office: [phone] Pager: [phone] After Hours/Weekend Pager: [phone]

## 2018-05-04 LAB — BASIC METABOLIC PANEL
Anion gap: 8 (ref 5–15)
BUN: 20 mg/dL (ref 8–23)
CALCIUM: 7.9 mg/dL — AB (ref 8.9–10.3)
CO2: 22 mmol/L (ref 22–32)
CREATININE: 0.86 mg/dL (ref 0.44–1.00)
Chloride: 95 mmol/L — ABNORMAL LOW (ref 98–111)
GFR calc Af Amer: >60 mL/min (ref >60)
GFR calc non Af Amer: 58 mL/min — ABNORMAL LOW (ref >60)
Glucose, Bld: 80 mg/dL (ref 70–99)
Potassium: 3 mmol/L — ABNORMAL LOW (ref 3.5–5.1)
Sodium: 125 mmol/L — ABNORMAL LOW (ref 135–145)

## 2018-05-04 LAB — SODIUM
Sodium: 125 mmol/L — ABNORMAL LOW (ref 135–145)
Sodium: 125 mmol/L — ABNORMAL LOW (ref 135–145)
Sodium: 126 mmol/L — ABNORMAL LOW (ref 135–145)
Sodium: 128 mmol/L — ABNORMAL LOW (ref 135–145)
Sodium: 130 mmol/L — ABNORMAL LOW (ref 135–145)

## 2018-05-04 MED ORDER — SODIUM CHLORIDE 3 % IV SOLN
INTRAVENOUS | Status: DC
  Administered 2018-05-04: 15 mL/h via INTRAVENOUS
  Filled 2018-05-04: qty 500

## 2018-05-04 MED ORDER — SODIUM CHLORIDE 3 % IV SOLN
INTRAVENOUS | Status: DC
  Filled 2018-05-04 (×2): qty 500

## 2018-05-04 MED ORDER — SODIUM CHLORIDE 3 % IV SOLN
INTRAVENOUS | Status: DC
  Administered 2018-05-04 (×2): 20 mL/h via INTRAVENOUS
  Filled 2018-05-04: qty 500

## 2018-05-04 MED ORDER — POTASSIUM CHLORIDE CRYS ER 20 MEQ PO TBCR
40.0000 meq | EXTENDED_RELEASE_TABLET | ORAL | Status: AC
  Administered 2018-05-04 (×2): 40 meq via ORAL
  Filled 2018-05-04 (×2): qty 2

## 2018-05-04 MED ORDER — CITALOPRAM HYDROBROMIDE 20 MG PO TABS
20.0000 mg | ORAL_TABLET | Freq: Every day | ORAL | Status: DC
  Administered 2018-05-05 – 2018-05-06 (×2): 20 mg via ORAL
  Filled 2018-05-04 (×2): qty 1

## 2018-05-04 NOTE — Progress Notes (Signed)
Na level 125, increased 3% hypertonic sodium infusion to 20 mL/hr per Pharmacist recommendation. Will continue to monitor

## 2018-05-04 NOTE — Evaluation (Signed)
Physical Therapy Evaluation Patient Details Name: Christina Wise MRN: [id number] DOB: [birth year] Today's Date: 05/04/2018   History of Present Illness  Patient is a 85 y.o. female with a PMHx of hearing impairment, hypertension, dementia, humeral fracture, who was admitted to [facility] on 05/01/2018 for evaluation of generalized weakness.  She was found to be profoundly hyponatremic with a serum sodium of 109.  Na this AM 125.    Clinical Impression  Per MD, no need for sling, RUE WBAT and gentle AROM/PROM in regards to R humeral fx.  Patient easily woken at start of session, denies any pain. Extremely HOH, difficulty to assess patient mental status. Patient did report living in a home with 2 steps to enter, lives alone, and endorses that lately she's been using a cane for ambulation.  Patient needed intermittent cues to stay awake and attend to task. Supine to sit with minA-modA. Endorsed dizziness sitting EOB. Orthostatic vitals assessed see vital flowsheet for details, RN notified. Patient unable to perform upright posture while sitting, CGA-minA to maintain balance. Due to safety concerns/pt fatigue/BP further mobility held at this time.  Overall the patient demonstrated deficits (see "PT Problem List") that impede the patient's functional abilities, safety, and mobility and would benefit from skilled PT intervention. Recommendation is STR due to level of assistance needed, safety concerns, and decreased caregiver support per pt.     Follow Up Recommendations SNF    Equipment Recommendations  None recommended by PT    Recommendations for Other Services OT consult     Precautions / Restrictions Precautions Precautions: Fall;Shoulder Type of Shoulder Precautions: per MD gentle AROM and PROM as able,  Precaution Booklet Issued: No Restrictions Weight Bearing Restrictions: Yes RUE Weight Bearing: Weight bearing as tolerated      Mobility  Bed Mobility Overal bed mobility: Needs  Assistance Bed Mobility: Supine to Sit;Sit to Supine     Supine to sit: Mod assist Sit to supine: Mod assist   General bed mobility comments: Pt in very foward flexed position. Unable to hold head up. 1-2 instances of sitting with supervision, predominantly CGA-minA  Transfers                 General transfer comment: deferred due safety  Ambulation/Gait                Stairs            Wheelchair Mobility    Modified Rankin (Stroke Patients Only)       Balance Overall balance assessment: Needs assistance Sitting-balance support: Feet supported;Single extremity supported Sitting balance-Leahy Scale: Poor                                       Pertinent Vitals/Pain Pain Assessment: No/denies pain    Home Living Family/patient expects to be discharged to:: Private residence Living Arrangements: Alone Available Help at Discharge: Family Type of Home: House Home Access: Stairs to enter   Secretary/administrator of Steps: 2 Home Layout: One level Home Equipment: Environmental consultant - 2 wheels;Cane - single point Additional Comments: Pt very HOH, potential AMS as well.    Prior Function           Comments: Pt very HOH, potential AMS as well. Reported that she fell several weeks ago. Unable to provide level of assistance needed.      Hand Dominance  Extremity/Trunk Assessment   Upper Extremity Assessment Upper Extremity Assessment: Defer to OT evaluation(AAROM for both UE)    Lower Extremity Assessment Lower Extremity Assessment: Generalized weakness(AAROM for bed level movements)    Cervical / Trunk Assessment Cervical / Trunk Assessment: Kyphotic  Communication      Cognition Arousal/Alertness: Lethargic Behavior During Therapy: WFL for tasks assessed/performed;Flat affect Overall Cognitive Status: No family/caregiver present to determine baseline cognitive functioning                                  General Comments: Pt oriented to self, knew she was in the hospital      General Comments      Exercises     Assessment/Plan    PT Assessment Patient needs continued PT services  PT Problem List Decreased strength;Decreased mobility;Decreased safety awareness;Decreased range of motion;Decreased knowledge of precautions;Decreased activity tolerance;Decreased balance;Decreased knowledge of use of DME;Pain       PT Treatment Interventions DME instruction;Functional mobility training;Balance training;Patient/family education;Gait training;Therapeutic activities;Neuromuscular re-education;Stair training;Therapeutic exercise    PT Goals (Current goals can be found in the Care Plan section)  Acute Rehab PT Goals Patient Stated Goal: to feel better PT Goal Formulation: With patient Time For Goal Achievement: 05/18/18 Potential to Achieve Goals: Good    Frequency Min 2X/week   Barriers to discharge Decreased caregiver support      Co-evaluation               AM-PAC PT "6 Clicks" Mobility  Outcome Measure Help needed turning from your back to your side while in a flat bed without using bedrails?: A Lot Help needed moving from lying on your back to sitting on the side of a flat bed without using bedrails?: A Lot Help needed moving to and from a bed to a chair (including a wheelchair)?: Total Help needed standing up from a chair using your arms (e.g., wheelchair or bedside chair)?: Total Help needed to walk in hospital room?: Total Help needed climbing 3-5 steps with a railing? : Total 6 Click Score: 8    End of Session Equipment Utilized During Treatment: Gait belt Activity Tolerance: Patient limited by fatigue;Other (comment)(orthostatic) Patient left: in bed;with bed alarm set Nurse Communication: Mobility status;Other (comment)(orthostatic hypotension) PT Visit Diagnosis: Unsteadiness on feet (R26.81);Muscle weakness (generalized) (M62.81);History of falling  (Z91.81);Other abnormalities of gait and mobility (R26.89);Pain Pain - Right/Left: Right Pain - part of body: Shoulder    Time: 8832-5498 PT Time Calculation (min) (ACUTE ONLY): 33 min   Charges:   PT Evaluation $PT Eval Moderate Complexity: 1 Mod PT Treatments $Therapeutic Activity: 8-22 mins        Olga Coaster PT, DPT 11:11 AM,05/04/18 [phone]

## 2018-05-04 NOTE — Progress Notes (Addendum)
MEDICATION RELATED CONSULT NOTE - INITIAL   Pharmacy Consult for Monitoring Na Levels Indication: Hypertonic NaCl 3%   Allergies  Allergen Reactions  . Alendronate Other (See Comments)    GI upset  . Codeine Nausea And Vomiting  . Risedronate Other (See Comments)    GI Upset    Patient Measurements: Height: 5\' 4"  (162.6 cm) Weight: 128 lb (58.1 kg) IBW/kg (Calculated) : 54.7 Adjusted Body Weight:   Vital Signs: Temp: 97.3 F (36.3 C) (03/22 2328) Temp Source: Axillary (03/22 2328) BP: 105/50 (03/22 2328) Pulse Rate: 48 (03/22 2328) Intake/Output from previous day: 03/22 0701 - 03/23 0700 In: 249.6 [P.O.:170; I.V.:79.6] Out: 1100 [Urine:1100] Intake/Output from this shift: Total I/O In: -  Out: 50 [Urine:50]  Labs: BMP Latest Ref Rng & Units 05/04/2018 05/04/2018 05/03/2018  Glucose 70 - 99 mg/dL 80 - -  BUN 8 - 23 mg/dL 20 - -  Creatinine 3.38 - 1.00 mg/dL 3.29 - -  Sodium 191 - 145 mmol/L 125(L) 125(L) 123(L)  Potassium 3.5 - 5.1 mmol/L 3.0(L) - -  Chloride 98 - 111 mmol/L 95(L) - -  CO2 22 - 32 mmol/L 22 - -  Calcium 8.9 - 10.3 mg/dL 7.9(L) - -    Estimated Creatinine Clearance: 34.5 mL/min (by C-G formula based on SCr of 0.86 mg/dL).  Medical History: Past Medical History:  Diagnosis Date  . Hearing impairment   . Hypertension   . Mild dementia (HCC)   . Thyroid disease    Assessment: Patient is a 85yo female admitted with severe hyponatremia. Patient is being initiated on NaCl 3% at 79ml/hr. Pharmacy is to monitor Na levels and f/u with provider for rapid changes in Na level.  3/22 12:23 Na 116, NaCl 3% started 3/22 14:04 Na 118 3/22 16:17 Na 120  3/22 20:39 Na 123  3/23 00:15 Na 125 Na continues to increase 2-3 mmol/L every 4 hours.  Na has increased 9 mmol/L in 12 hours.- Infusion decreased to 73ml/hr.  3/23 04:01 Na 125  Plan:  3/23 @ 0401 Na still 125.  RPh to evaluate Na level and contact MD if Na rises > in 2hr or > over 4 hours.    Goal is not to exceed 10-12 mmol/L increase in 24 hours.  Na has increased 9 mmol/L in ~ 18 hours.   After discussion with Dr. Sheryle Hail rate will be increased to 37ml/hr. Next Na check in 4 hours (@0800 )   May need to prompt lab/nursing to make sure levels are checked q4h.  Gardner Candle, PharmD, BCPS Clinical Pharmacist 05/04/2018 4:46 AM

## 2018-05-04 NOTE — Progress Notes (Signed)
Hypertonic 3% solution decreased to 15 mL/hr per pharmacist recommendation. No issues noted. Will continue to monitor.

## 2018-05-04 NOTE — NC FL2 (Signed)
[place] MEDICAID FL2 LEVEL OF CARE SCREENING TOOL     IDENTIFICATION  Patient Name: Christina Wise Birthdate: 1921-09-02 Sex: female Admission Date (Current Location): 05/01/2018  [place] and [place] Number:  Chiropodist and Address:  [facility], [address] [place], [place] [address]      Provider Number: [id number]  Attending Physician Name and Address:  Enid Baas, MD  Relative Name and Phone Number:  Carel Jaillet [phone]    Current Level of Care: Hospital Recommended Level of Care: Skilled Nursing Facility Prior Approval Number:    Date Approved/Denied:   PASRR Number:    Discharge Plan: SNF    Current Diagnoses: Patient Active Problem List   Diagnosis Date Noted  . Protein-calorie malnutrition, severe 05/03/2018  . Hyponatremia 05/01/2018    Orientation RESPIRATION BLADDER Height & Weight     Self, Situation, Place  Normal Continent Weight: 58.1 kg Height:  5\' 4"  (162.6 cm)  BEHAVIORAL SYMPTOMS/MOOD NEUROLOGICAL BOWEL NUTRITION STATUS      Continent Diet(regular)  AMBULATORY STATUS COMMUNICATION OF NEEDS Skin   Extensive Assist Verbally Normal                       Personal Care Assistance Level of Assistance  Dressing, Bathing Bathing Assistance: Limited assistance   Dressing Assistance: Limited assistance     Functional Limitations Info  Hearing   Hearing Info: Impaired      SPECIAL CARE FACTORS FREQUENCY  PT (By licensed PT)     PT Frequency: 5 times A Week              Contractures Contractures Info: Not present    Additional Factors Info  Code Status Code Status Info: DNR             Current Medications (05/04/2018):  This is the current hospital active medication list Current Facility-Administered Medications  Medication Dose Route Frequency Provider Last Rate Last Dose  . acetaminophen (TYLENOL) tablet 650 mg  650 mg Oral Q6H PRN Alford Highland, MD    650 mg at 05/01/18 2147   Or  . acetaminophen (TYLENOL) suppository 650 mg  650 mg Rectal Q6H PRN Alford Highland, MD      . ALPRAZolam Prudy Feeler) tablet 0.25 mg  0.25 mg Oral TID Alford Highland, MD   0.25 mg at 05/04/18 0867  . amLODipine (NORVASC) tablet 5 mg  5 mg Oral Daily Alford Highland, MD   5 mg at 05/04/18 0834  . aspirin EC tablet 81 mg  81 mg Oral Daily Alford Highland, MD   81 mg at 05/04/18 6195  . [START ON 05/05/2018] citalopram (CELEXA) tablet 20 mg  20 mg Oral Daily Enid Baas, MD      . enoxaparin (LOVENOX) injection 40 mg  40 mg Subcutaneous Q24H Gouru, Aruna, MD   40 mg at 05/03/18 2127  . feeding supplement (ENSURE ENLIVE) (ENSURE ENLIVE) liquid 237 mL  237 mL Oral BID BM Gouru, Aruna, MD   237 mL at 05/04/18 0834  . levothyroxine (SYNTHROID, LEVOTHROID) tablet 50 mcg  50 mcg Oral Daily Alford Highland, MD   50 mcg at 05/04/18 0515  . montelukast (SINGULAIR) tablet 10 mg  10 mg Oral QHS Alford Highland, MD   10 mg at 05/03/18 2127  . ondansetron (ZOFRAN) tablet 4 mg  4 mg Oral Q6H PRN Alford Highland, MD       Or  . ondansetron [facility]) injection 4 mg  4 mg Intravenous Q6H PRN Wieting, Richard, MD      . potassium chloride SA (K-DUR,KLOR-CON) CR tablet 40 mEq  40 mEq Oral Q4H Enid Baas, MD   40 mEq at 05/04/18 0834  . sodium chloride (hypertonic) 3 % solution   Intravenous Continuous Kolluru, Sarath, MD         Discharge Medications: Please see discharge summary for a list of discharge medications.  Relevant Imaging Results:  Relevant Lab Results:   Additional Information  [id number]  Barrie Dunker, RN

## 2018-05-04 NOTE — Progress Notes (Addendum)
[employer] [facility] [place] [facility]   PATIENT NAME: Christina Wise    MR#:  [id number]  DATE OF BIRTH:  1921-08-11  SUBJECTIVE:  CHIEF COMPLAINT:   Chief Complaint  Patient presents with  . Weakness   -Patient lives at home independently.  Had a fall last month and acute right humeral fracture was discharged home in a sling.  Brought back for altered mental status and noted to have hyponatremia -Currently on 3% saline and sodium at 125  REVIEW OF SYSTEMS:  Review of Systems  Constitutional: Positive for malaise/fatigue. Negative for chills and fever.  HENT: Positive for hearing loss. Negative for ear discharge and nosebleeds.   Eyes: Negative for blurred vision and double vision.  Respiratory: Negative for cough, shortness of breath and wheezing.   Cardiovascular: Negative for chest pain, palpitations and leg swelling.  Gastrointestinal: Negative for abdominal pain, constipation, diarrhea, nausea and vomiting.  Genitourinary: Negative for dysuria.  Musculoskeletal: Negative for myalgias.  Neurological: Negative for dizziness, seizures, weakness and headaches.  Psychiatric/Behavioral: Negative for depression.    DRUG ALLERGIES:   Allergies  Allergen Reactions  . Alendronate Other (See Comments)    GI upset  . Codeine Nausea And Vomiting  . Risedronate Other (See Comments)    GI Upset    VITALS:  Blood pressure 119/60, pulse (!) 54, temperature 97.9 F (36.6 C), temperature source Oral, resp. rate 16, height 5\' 4"  (1.626 m), weight 58.1 kg, SpO2 96 %.  PHYSICAL EXAMINATION:  Physical Exam  GENERAL:  85 y.o.-year-old elderly patient lying in the bed with no acute distress.  EYES: Pupils equal, round, reactive to light and accommodation. No scleral icterus. Extraocular muscles intact.  HEENT: Head atraumatic, normocephalic. Oropharynx and nasopharynx clear.  Central head tremors are noted NECK:  Supple, no jugular venous distention. No thyroid  enlargement, no tenderness.  LUNGS: Normal breath sounds bilaterally, no wheezing, rales,rhonchi or crepitation. No use of accessory muscles of respiration.  Dereased bibasilar breath sounds CARDIOVASCULAR: S1, S2 normal. No  rubs, or gallops.  2/6 systolic murmur is present ABDOMEN: Soft, nontender, nondistended. Bowel sounds present. No organomegaly or mass.  EXTREMITIES: No pedal edema, cyanosis, or clubbing.  NEUROLOGIC: Cranial nerves II through XII are intact. Muscle strength 5/5 in all extremities.  Following simple commands.  Has global weakness. sensation intact. Gait not checked.  PSYCHIATRIC: The patient is alert and oriented x 2.  Very hard of hearing SKIN: No obvious rash, lesion, or ulcer.    LABORATORY PANEL:   CBC Recent Labs  Lab 05/02/18 0424  WBC 8.6  HGB 12.0  HCT 32.9*  PLT 190   ------------------------------------------------------------------------------------------------------------------  Chemistries  Recent Labs  Lab 05/01/18 1611  05/03/18 0504  05/04/18 0401 05/04/18 0749  NA 109*   < > 117*   < > 125* 125*  K 3.4*   < >  --   --  3.0*  --   CL 73*   < >  --   --  95*  --   CO2 22   < >  --   --  22  --   GLUCOSE 110*   < >  --   --  80  --   BUN 26*   < >  --   --  20  --   CREATININE 1.02*   < >  --   --  0.86  --   CALCIUM 8.6*   < >  --   --  7.9*  --   MG  --    < > 2.1  --   --   --   AST 38  --   --   --   --   --   ALT 18  --   --   --   --   --   ALKPHOS 130*  --   --   --   --   --   BILITOT 1.9*  --   --   --   --   --    < > = values in this interval not displayed.   ------------------------------------------------------------------------------------------------------------------  Cardiac Enzymes Recent Labs  Lab 05/01/18 1611  TROPONINI 0.05*   ------------------------------------------------------------------------------------------------------------------  RADIOLOGY:  No results found.  EKG:   Orders placed or  performed during the hospital encounter of 05/01/18  . ED EKG  . ED EKG    ASSESSMENT AND PLAN:   85 year old female with past medical history significant for hypertension, hypothyroidism who lives at home by herself brought in secondary to altered mental status and noted to have hyponatremia  1.  Acute hyponatremia on presentation-secondary to poor oral intake. -Also patient was on hydrochlorothiazide at home which has been appropriately discontinued. -Sodium was 109.  Was receiving IV fluids with normal saline initially with no significant increase in sodium.  Fluids changed over to 3% saline.  Sodium at 125 today. -Appreciate nephrology consult  2.  Hypokalemia-being replaced  3.  Hypertension-Norvasc  4.  Hypothyroidism-Synthroid  5.  Recent fall last month-patient had a fall in a long time in February 2019, came to the ER had acute proximal humeral comminuted fracture, she was placed in a sling and discharged home with home health.  No further falls reported. -Physical therapy to work with the patient today.  6. Dementia- has underlying dementia, oriented x2, CT head with chronic microvascular ischemic changes and atrophy  6.  DVT prophylaxis-Lovenox  Might need placement.   All the records are reviewed and case discussed with Care Management/Social Workerr. Management plans discussed with the patient, family and they are in agreement.  CODE STATUS: DNR  TOTAL TIME TAKING CARE OF THIS PATIENT: 37 minutes.   POSSIBLE D/C IN 1-2 DAYS, DEPENDING ON CLINICAL CONDITION.   Enid Baas M.D on 05/04/2018 at 9:16 AM  Between 7am to 6pm - Pager - [phone]  After 6pm go to [email or web address] - Social research officer, government  [facility]  Office  [phone]  CC: Primary care physician; Kandyce Rud, MD

## 2018-05-04 NOTE — Progress Notes (Signed)
[facility]  ROUNDING NOTE   Subjective:   3% saline infusion 21mL/hr Na 126   Objective:  Vital signs in last 24 hours:  Temp:  [97.3 F (36.3 C)-97.9 F (36.6 C)] 97.9 F (36.6 C) (03/23 0725) Pulse Rate:  [48-59] 59 (03/23 1056) Resp:  [16] 16 (03/23 0725) BP: (105-119)/(49-61) 116/49 (03/23 1056) SpO2:  [96 %-100 %] 100 % (03/23 1056)  Weight change:  Filed Weights   05/01/18 1609  Weight: 58.1 kg    Intake/Output: I/O last 3 completed shifts: In: 497.4 [P.O.:170; I.V.:327.4] Out: 1700 [Urine:1700]   Intake/Output this shift:  No intake/output data recorded.  Physical Exam: General: No acute distress  Head: Normocephalic, atraumatic. Moist oral mucosal membranes  Eyes: Anicteric  Neck: Supple, trachea midline  Lungs:  Clear to auscultation, normal effort  Heart: regular  Abdomen:  Soft, nontender, bowel sounds present  Extremities: No peripheral edema.  Neurologic: Alert to self only  Skin: No lesions       Basic Metabolic Panel: Recent Labs  Lab 05/01/18 1611 05/01/18 1954  05/02/18 0500  05/03/18 0504  05/03/18 2039 05/04/18 0015 05/04/18 0401 05/04/18 0749 05/04/18 1235  NA 109* 112*   < > 112*   < > 117*   < > 123* 125* 125* 125* 126*  K 3.4*  --   --  3.3*  --   --   --   --   --  3.0*  --   --   CL 73*  --   --  81*  --   --   --   --   --  95*  --   --   CO2 22  --   --  23  --   --   --   --   --  22  --   --   GLUCOSE 110*  --   --  82  --   --   --   --   --  80  --   --   BUN 26*  --   --  22  --   --   --   --   --  20  --   --   CREATININE 1.02*  --   --  0.97  --   --   --   --   --  0.86  --   --   CALCIUM 8.6*  --   --  8.1*  --   --   --   --   --  7.9*  --   --   MG  --  1.6*  --   --   --  2.1  --   --   --   --   --   --   PHOS  --  2.8  --   --   --   --   --   --   --   --   --   --    < > = values in this interval not displayed.    Liver Function Tests: Recent Labs  Lab 05/01/18 1611  AST 38  ALT 18   ALKPHOS 130*  BILITOT 1.9*  PROT 6.2*  ALBUMIN 4.2   No results for input(s): LIPASE, AMYLASE in the last 168 hours. Recent Labs  Lab 05/01/18 1954 05/02/18 0424  AMMONIA <9* <9*    CBC: Recent Labs  Lab 05/01/18 1611 05/02/18 0424  WBC 14.8*  8.6  HGB 13.0 12.0  HCT 34.8* 32.9*  MCV 78.6* 80.2  PLT 270 190    Cardiac Enzymes: Recent Labs  Lab 05/01/18 1611  TROPONINI 0.05*    BNP: Invalid input(s): POCBNP  CBG: Recent Labs  Lab 05/01/18 1635 05/01/18 2013  GLUCAP 113* 104*    Microbiology: Results for orders placed or performed during the hospital encounter of 05/01/18  Urine culture     Status: None   Collection Time: 05/01/18  4:16 PM  Result Value Ref Range Status   Specimen Description   Final    URINE, RANDOM Performed at [facility], [address] [place], [place] [address]    Special Requests   Final    NONE Performed at [facility], [address] [place], [place] [address]    Culture   Final    NO GROWTH Performed at [facility], [address] [place], [place] [address]    Report Status 05/03/2018 FINAL  Final  MRSA PCR Screening     Status: None   Collection Time: 05/02/18  5:44 AM  Result Value Ref Range Status   MRSA by PCR NEGATIVE NEGATIVE Final    Comment:        The GeneXpert MRSA Assay (FDA approved for NASAL specimens only), is one component of a comprehensive MRSA colonization surveillance program. It is not intended to diagnose MRSA infection nor to guide or monitor treatment for MRSA infections. Performed at [facility], [address] [place], [place] [address]     Coagulation Studies: No results for input(s): LABPROT, INR in the last 72 hours.  Urinalysis: Recent Labs    05/01/18 1616  COLORURINE YELLOW*  LABSPEC 1.010  PHURINE 7.0  GLUCOSEU NEGATIVE  HGBUR NEGATIVE  BILIRUBINUR NEGATIVE  KETONESUR 5*  PROTEINUR NEGATIVE  NITRITE NEGATIVE  LEUKOCYTESUR  NEGATIVE      Imaging: No results found.   Medications:   . sodium chloride (hypertonic) 20 mL/hr (05/04/18 0830)   . ALPRAZolam  0.25 mg Oral TID  . amLODipine  5 mg Oral Daily  . aspirin EC  81 mg Oral Daily  . [START ON 05/05/2018] citalopram  20 mg Oral Daily  . enoxaparin (LOVENOX) injection  40 mg Subcutaneous Q24H  . feeding supplement (ENSURE ENLIVE)  237 mL Oral BID BM  . levothyroxine  50 mcg Oral Daily  . montelukast  10 mg Oral QHS  . potassium chloride  40 mEq Oral Q4H   acetaminophen **OR** acetaminophen, ondansetron **OR** ondansetron (ZOFRAN) IV  Assessment/ Plan:  85 y.o. female with a PMHx of hearing impairment, hypertension, dementia, humeral fracture, hypothyroidism, who was admitted to [facility] on 05/01/2018 for Hyponatremia. Sodium 109  - Continue 3% saline infusion. Will increase rate to 77mL/hr for correction of 0.5 per hour.  - Continue serial sodium checks.      LOS: 3 Christina Wise 3/23/20201:41 PM

## 2018-05-04 NOTE — Progress Notes (Signed)
MEDICATION RELATED CONSULT NOTE - INITIAL   Pharmacy Consult for Monitoring Na Levels Indication: Hypertonic NaCl 3%   Allergies  Allergen Reactions  . Alendronate Other (See Comments)    GI upset  . Codeine Nausea And Vomiting  . Risedronate Other (See Comments)    GI Upset    Patient Measurements: Height: 5\' 4"  (162.6 cm) Weight: 128 lb (58.1 kg) IBW/kg (Calculated) : 54.7 Adjusted Body Weight:   Vital Signs: Temp: 97.4 F (36.3 C) (03/23 1526) Temp Source: Oral (03/23 1526) BP: 105/42 (03/23 1526) Pulse Rate: 63 (03/23 1526) Intake/Output from previous day: 03/22 0701 - 03/23 0700 In: 497.4 [P.O.:170; I.V.:327.4] Out: 1350 [Urine:1350] Intake/Output from this shift: Total I/O In: 240 [P.O.:240] Out: -   Labs: BMP Latest Ref Rng & Units 05/04/2018 05/04/2018 05/04/2018  Glucose 70 - 99 mg/dL - - -  BUN 8 - 23 mg/dL - - -  Creatinine 4.97 - 1.00 mg/dL - - -  Sodium 026 - 378 mmol/L 128(L) 126(L) 125(L)  Potassium 3.5 - 5.1 mmol/L - - -  Chloride 98 - 111 mmol/L - - -  CO2 22 - 32 mmol/L - - -  Calcium 8.9 - 10.3 mg/dL - - -    Estimated Creatinine Clearance: 34.5 mL/min (by C-G formula based on SCr of 0.86 mg/dL).  Medical History: Past Medical History:  Diagnosis Date  . Hearing impairment   . Hypertension   . Mild dementia (HCC)   . Thyroid disease    Assessment: Patient is a 85yo female admitted with severe hyponatremia. Patient is being initiated on NaCl 3% at 50ml/hr. Pharmacy is to monitor Na levels and f/u with provider for rapid changes in Na level.  3/22 12:23 Na 116, NaCl 3% started 3/22 14:04 Na 118 3/22 16:17 Na 120  3/22 20:39 Na 123  3/23 00:15 Na 125 Na continues to increase 2-3 mmol/L every 4 hours.  Na has increased 9 mmol/L in 12 hours.- Infusion decreased to 62ml/hr.  3/23 04:01 Na 125 3/23 07:49 Na 125 3/23 12:35 Na 126 3/23 16:41 Na 128 (rate 43ml./hr)  Plan:  Will continue to monitor levels every 4 hours.  RPh to evaluate Na  level and contact MD if Na rises > in 2hr or > over 4 hours.   Goal is not to exceed 10-12 mmol/L increase in 24 hours.  Na has increased 5 mmol/L in ~ 22 hours.   Will continue current rate of 24ml/hr. Next Na check in 4 hours (@2000 )  May need to prompt lab/nursing to make sure levels are checked q4h.  Albina Billet, PharmD, BCPS Clinical Pharmacist 05/04/2018 5:12 PM

## 2018-05-04 NOTE — TOC Initial Note (Signed)
Transition of Care [facility]) - Initial/Assessment Note    Patient Details  Name: Christina Wise MRN: [id number] Date of Birth: 1921/04/08  Transition of Care [facility]) CM/SW Contact:    Su Hilt, RN Phone Number: 05/04/2018, 1:38 PM  Clinical Narrative:                 Met with the patient and got permission to start the bed request process and do the PASRR and FL2, spoke with son Meda Coffee on the phone and obtained the information regarding the patient situation, The patient lives alone and uses a walker for ambulation, She has been getting weaker and fell a month ago and obtained a FX of some kind, (not clear what was fractured) The patient is very hearing impaired and has mild dementia. The son gave permission to start the SNF rehab process.  Expected Discharge Plan: Skilled Nursing Facility Barriers to Discharge: SNF Pending bed offer, Continued Medical Work up   Patient Goals and CMS Choice Patient states their goals for this hospitalization and ongoing recovery are:: go to rehab      Expected Discharge Plan and Services Expected Discharge Plan: [place]       Living arrangements for the past 2 months: Single Family Home                          Prior Living Arrangements/Services Living arrangements for the past 2 months: Single Family Home Lives with:: Self Patient language and need for interpreter reviewed:: Yes Do you feel safe going back to the place where you live?: No   patient unable to care for self  Need for Family Participation in Patient Care: Yes (Comment) Care giver support system in place?: Yes (comment) Current home services: DME(has a cane at home) Criminal Activity/Legal Involvement Pertinent to Current Situation/Hospitalization: No - Comment as needed  Activities of Daily Living Home Assistive Devices/Equipment: Hearing aid, Eyeglasses ADL Screening (condition at time of admission) Patient's cognitive ability adequate to  safely complete daily activities?: Yes Is the patient deaf or have difficulty hearing?: Yes Does the patient have difficulty seeing, even when wearing glasses/contacts?: No Does the patient have difficulty concentrating, remembering, or making decisions?: Yes Patient able to express need for assistance with ADLs?: Yes Does the patient have difficulty dressing or bathing?: Yes Independently performs ADLs?: No Communication: Appropriate for developmental age Dressing (OT): Needs assistance Is this a change from baseline?: Pre-admission baseline Grooming: Needs assistance Is this a change from baseline?: Pre-admission baseline Feeding: Appropriate for developmental age Bathing: Needs assistance Is this a change from baseline?: Pre-admission baseline Toileting: Needs assistance Is this a change from baseline?: Pre-admission baseline In/Out Bed: Appropriate for developmental age 19 in Home: Needs assistance Is this a change from baseline?: Pre-admission baseline Does the patient have difficulty walking or climbing stairs?: Yes Weakness of Legs: Both Weakness of Arms/Hands: Both  Permission Sought/Granted Permission sought to share information with : Case Manager, Chartered certified accountant granted to share information with : Yes, Verbal Permission Granted     Permission granted to share info w AGENCY: any SNF        Emotional Assessment Appearance:: Appears stated age Attitude/Demeanor/Rapport: Engaged   Orientation: : Oriented to Self, Oriented to Situation Alcohol / Substance Use: Never Used Psych Involvement: No (comment)  Admission diagnosis:  Hyponatremia [E87.1] Bacteriuria [R82.71] Altered mental status, unspecified altered mental status type [R41.82] Patient Active Problem List   Diagnosis Date  Noted  . Protein-calorie malnutrition, severe 05/03/2018  . Hyponatremia 05/01/2018   PCP:  Derinda Late, MD Pharmacy:   [place], [place] [address] [place] [address] Phone: [phone] Fax: [phone]     Social Determinants of Health (SDOH) Interventions    Readmission Risk Interventions No flowsheet data found.

## 2018-05-04 NOTE — Progress Notes (Signed)
MEDICATION RELATED CONSULT NOTE - INITIAL   Pharmacy Consult for Monitoring Na Levels Indication: Hypertonic NaCl 3%   Allergies  Allergen Reactions  . Alendronate Other (See Comments)    GI upset  . Codeine Nausea And Vomiting  . Risedronate Other (See Comments)    GI Upset    Patient Measurements: Height: 5\' 4"  (162.6 cm) Weight: 128 lb (58.1 kg) IBW/kg (Calculated) : 54.7 Adjusted Body Weight:   Vital Signs: Temp: 97.9 F (36.6 C) (03/23 0725) Temp Source: Oral (03/23 0725) BP: 116/49 (03/23 1056) Pulse Rate: 59 (03/23 1056) Intake/Output from previous day: 03/22 0701 - 03/23 0700 In: 497.4 [P.O.:170; I.V.:327.4] Out: 1350 [Urine:1350] Intake/Output from this shift: No intake/output data recorded.  Labs: BMP Latest Ref Rng & Units 05/04/2018 05/04/2018 05/04/2018  Glucose 70 - 99 mg/dL - - 80  BUN 8 - 23 mg/dL - - 20  Creatinine 0.25 - 1.00 mg/dL - - 8.52  Sodium 778 - 145 mmol/L 126(L) 125(L) 125(L)  Potassium 3.5 - 5.1 mmol/L - - 3.0(L)  Chloride 98 - 111 mmol/L - - 95(L)  CO2 22 - 32 mmol/L - - 22  Calcium 8.9 - 10.3 mg/dL - - 7.9(L)    Estimated Creatinine Clearance: 34.5 mL/min (by C-G formula based on SCr of 0.86 mg/dL).  Medical History: Past Medical History:  Diagnosis Date  . Hearing impairment   . Hypertension   . Mild dementia (HCC)   . Thyroid disease    Assessment: Patient is a 85yo female admitted with severe hyponatremia. Patient is being initiated on NaCl 3% at 40ml/hr. Pharmacy is to monitor Na levels and f/u with provider for rapid changes in Na level.  3/22 12:23 Na 116, NaCl 3% started 3/22 14:04 Na 118 3/22 16:17 Na 120  3/22 20:39 Na 123  3/23 00:15 Na 125 Na continues to increase 2-3 mmol/L every 4 hours.  Na has increased 9 mmol/L in 12 hours.- Infusion decreased to 25ml/hr.  3/23 04:01 Na 125 3/23 07:49 Na 125 3/23 12:35 Na 126  Plan:  3/23 @ 1235 Na increase to 126.  Consulted with Nephrologist and increased rate from 20  ml/hr to 30 ml/hr. Will continue to monitor levels every 4 hours.  RPh to evaluate Na level and contact MD if Na rises > in 2hr or > over 4 hours.   Goal is not to exceed 10-12 mmol/L increase in 24 hours.  Na has increased 9 mmol/L in ~ 22 hours.   Will continue current rate of 81ml/hr. Next Na check in 4 hours (@1600 )  May need to prompt lab/nursing to make sure levels are checked q4h.  Bettey Costa, PharmD Clinical Pharmacist 05/04/2018 1:46 PM

## 2018-05-04 NOTE — Progress Notes (Signed)
MEDICATION RELATED CONSULT NOTE - INITIAL   Pharmacy Consult for Monitoring Na Levels Indication: Hypertonic NaCl 3%   Allergies  Allergen Reactions  . Alendronate Other (See Comments)    GI upset  . Codeine Nausea And Vomiting  . Risedronate Other (See Comments)    GI Upset    Patient Measurements: Height: 5\' 4"  (162.6 cm) Weight: 128 lb (58.1 kg) IBW/kg (Calculated) : 54.7 Adjusted Body Weight:   Vital Signs: Temp: 97.3 F (36.3 C) (03/22 2328) Temp Source: Axillary (03/22 2328) BP: 105/50 (03/22 2328) Pulse Rate: 48 (03/22 2328) Intake/Output from previous day: 03/22 0701 - 03/23 0700 In: 249.6 [P.O.:170; I.V.:79.6] Out: 1100 [Urine:1100] Intake/Output from this shift: Total I/O In: -  Out: 50 [Urine:50]  Labs: BMP Latest Ref Rng & Units 05/04/2018 05/03/2018 05/03/2018  Glucose 70 - 99 mg/dL - - -  BUN 8 - 23 mg/dL - - -  Creatinine 6.25 - 1.00 mg/dL - - -  Sodium 638 - 937 mmol/L 125(L) 123(L) 120(L)  Potassium 3.5 - 5.1 mmol/L - - -  Chloride 98 - 111 mmol/L - - -  CO2 22 - 32 mmol/L - - -  Calcium 8.9 - 10.3 mg/dL - - -    Estimated Creatinine Clearance: 30.6 mL/min (by C-G formula based on SCr of 0.97 mg/dL).  Medical History: Past Medical History:  Diagnosis Date  . Hearing impairment   . Hypertension   . Mild dementia (HCC)   . Thyroid disease    Assessment: Patient is a 85yo female admitted with severe hyponatremia. Patient is being initiated on NaCl 3% at 51ml/hr. Pharmacy is to monitor Na levels and f/u with provider for rapid changes in Na level.  3/22 12:23 Na 116, NaCl 3% started 3/22 14:04 Na 118 3/22 16:17 Na 120  3/22 20:39 Na 123  3/23  00:15 Na 125  Plan:  3/23 Na continues to increase 2-3 mmol/L every 4 hours. RPh to evaluate Na level and contact MD if Na rises > in 2hr or > over 4 hours. Goal is not to exceed 10-12 mmol/L increase in 24 hours. Na has increased 9 mmol/L in 12 hours. After discussion with Dr. Anne Hahn rate  will be reduced to 68ml/hr. Next Na check in 4 hours (@0400 )   May need to prompt lab/nursing to make sure levels are checked q4h.  Gardner Candle, PharmD, BCPS Clinical Pharmacist 05/04/2018 12:54 AM

## 2018-05-04 NOTE — NC FL2 (Signed)
[place] MEDICAID FL2 LEVEL OF CARE SCREENING TOOL     IDENTIFICATION  Patient Name: Christina Wise Birthdate: 1921-07-13 Sex: female Admission Date (Current Location): 05/01/2018  [place] and [place] Number:  Chiropodist and Address:  [facility], [address], [place], [place] [address]      Provider Number: [id number]  Attending Physician Name and Address:  Enid Baas, MD  Relative Name and Phone Number:  Bernelle Mehring [phone]    Current Level of Care: Hospital Recommended Level of Care: Skilled Nursing Facility Prior Approval Number:    Date Approved/Denied:   PASRR Number:    Discharge Plan: SNF    Current Diagnoses: Patient Active Problem List   Diagnosis Date Noted  . Protein-calorie malnutrition, severe 05/03/2018  . Hyponatremia 05/01/2018    Orientation RESPIRATION BLADDER Height & Weight     Self, Situation, Place  Normal Continent Weight: 58.1 kg Height:  5\' 4"  (162.6 cm)  BEHAVIORAL SYMPTOMS/MOOD NEUROLOGICAL BOWEL NUTRITION STATUS      Continent Diet(regular)  AMBULATORY STATUS COMMUNICATION OF NEEDS Skin   Extensive Assist Verbally Normal                       Personal Care Assistance Level of Assistance  Dressing, Bathing Bathing Assistance: Limited assistance   Dressing Assistance: Limited assistance     Functional Limitations Info  Hearing   Hearing Info: Impaired      SPECIAL CARE FACTORS FREQUENCY  PT (By licensed PT)     PT Frequency: 5 times A Week              Contractures Contractures Info: Not present    Additional Factors Info  Code Status Code Status Info: DNR             Current Medications (05/04/2018):  This is the current hospital active medication list Current Facility-Administered Medications  Medication Dose Route Frequency Provider Last Rate Last Dose  . acetaminophen (TYLENOL) tablet 650 mg  650 mg Oral Q6H PRN Alford Highland, MD    650 mg at 05/01/18 2147   Or  . acetaminophen (TYLENOL) suppository 650 mg  650 mg Rectal Q6H PRN Alford Highland, MD      . ALPRAZolam Prudy Feeler) tablet 0.25 mg  0.25 mg Oral TID Alford Highland, MD   0.25 mg at 05/04/18 1434  . amLODipine (NORVASC) tablet 5 mg  5 mg Oral Daily Alford Highland, MD   5 mg at 05/04/18 0834  . aspirin EC tablet 81 mg  81 mg Oral Daily Alford Highland, MD   81 mg at 05/04/18 8299  . [START ON 05/05/2018] citalopram (CELEXA) tablet 20 mg  20 mg Oral Daily Enid Baas, MD      . enoxaparin (LOVENOX) injection 40 mg  40 mg Subcutaneous Q24H Gouru, Aruna, MD   40 mg at 05/03/18 2127  . feeding supplement (ENSURE ENLIVE) (ENSURE ENLIVE) liquid 237 mL  237 mL Oral BID BM Gouru, Aruna, MD   237 mL at 05/04/18 1434  . levothyroxine (SYNTHROID, LEVOTHROID) tablet 50 mcg  50 mcg Oral Daily Alford Highland, MD   50 mcg at 05/04/18 0515  . montelukast (SINGULAIR) tablet 10 mg  10 mg Oral QHS Alford Highland, MD   10 mg at 05/03/18 2127  . ondansetron (ZOFRAN) tablet 4 mg  4 mg Oral Q6H PRN Alford Highland, MD       Or  . ondansetron [facility]) injection 4 mg  4 mg Intravenous Q6H PRN Alford Highland, MD      . sodium chloride (hypertonic) 3 % solution   Intravenous Continuous Kolluru, Sarath, MD 30 mL/hr at 05/04/18 1433 30 mL/hr at 05/04/18 1433     Discharge Medications: Please see discharge summary for a list of discharge medications.  Relevant Imaging Results:  Relevant Lab Results:   Additional Information    Barrie Dunker, RN

## 2018-05-04 NOTE — Progress Notes (Addendum)
MEDICATION RELATED CONSULT NOTE - INITIAL   Pharmacy Consult for Monitoring Na Levels Indication: Hypertonic NaCl 3%   Allergies  Allergen Reactions  . Alendronate Other (See Comments)    GI upset  . Codeine Nausea And Vomiting  . Risedronate Other (See Comments)    GI Upset    Patient Measurements: Height: 5\' 4"  (162.6 cm) Weight: 128 lb (58.1 kg) IBW/kg (Calculated) : 54.7 Adjusted Body Weight:   Vital Signs: Temp: 97.9 F (36.6 C) (03/23 0725) Temp Source: Oral (03/23 0725) BP: 119/60 (03/23 0725) Pulse Rate: 54 (03/23 0725) Intake/Output from previous day: 03/22 0701 - 03/23 0700 In: 497.4 [P.O.:170; I.V.:327.4] Out: 1350 [Urine:1350] Intake/Output from this shift: No intake/output data recorded.  Labs: BMP Latest Ref Rng & Units 05/04/2018 05/04/2018 05/04/2018  Glucose 70 - 99 mg/dL - 80 -  BUN 8 - 23 mg/dL - 20 -  Creatinine 4.43 - 1.00 mg/dL - 1.54 -  Sodium 008 - 145 mmol/L 125(L) 125(L) 125(L)  Potassium 3.5 - 5.1 mmol/L - 3.0(L) -  Chloride 98 - 111 mmol/L - 95(L) -  CO2 22 - 32 mmol/L - 22 -  Calcium 8.9 - 10.3 mg/dL - 7.9(L) -    Estimated Creatinine Clearance: 34.5 mL/min (by C-G formula based on SCr of 0.86 mg/dL).  Medical History: Past Medical History:  Diagnosis Date  . Hearing impairment   . Hypertension   . Mild dementia (HCC)   . Thyroid disease    Assessment: Patient is a 85yo female admitted with severe hyponatremia. Patient is being initiated on NaCl 3% at 24ml/hr. Pharmacy is to monitor Na levels and f/u with provider for rapid changes in Na level.  3/22 12:23 Na 116, NaCl 3% started 3/22 14:04 Na 118 3/22 16:17 Na 120  3/22 20:39 Na 123  3/23 00:15 Na 125 Na continues to increase 2-3 mmol/L every 4 hours.  Na has increased 9 mmol/L in 12 hours.- Infusion decreased to 67ml/hr.  3/23 04:01 Na 125 3/23 07:49 Na 125  Plan:  3/23 @ 0749 Na still 125.  RPh to evaluate Na level and contact MD if Na rises > in 2hr or > over 4  hours.   Goal is not to exceed 10-12 mmol/L increase in 24 hours.  Na has increased 9 mmol/L in ~ 22 hours.   Will continue current rate of 85ml/hr. Next Na check in 4 hours (@1200 )  May need to prompt lab/nursing to make sure levels are checked q4h.  Bettey Costa, PharmD Clinical Pharmacist 05/04/2018 8:28 AM

## 2018-05-04 NOTE — NC FL2 (Signed)
[place] MEDICAID FL2 LEVEL OF CARE SCREENING TOOL     IDENTIFICATION  Patient Name: Christina Wise Birthdate: 1921-09-02 Sex: female Admission Date (Current Location): 05/01/2018  [place] and [place] Number:  Chiropodist and Address:  [facility], [address] [place], [place] [address]      Provider Number: [id number]  Attending Physician Name and Address:  Enid Baas, MD  Relative Name and Phone Number:  Carel Jaillet [phone]    Current Level of Care: Hospital Recommended Level of Care: Skilled Nursing Facility Prior Approval Number:    Date Approved/Denied:   PASRR Number:    Discharge Plan: SNF    Current Diagnoses: Patient Active Problem List   Diagnosis Date Noted  . Protein-calorie malnutrition, severe 05/03/2018  . Hyponatremia 05/01/2018    Orientation RESPIRATION BLADDER Height & Weight     Self, Situation, Place  Normal Continent Weight: 58.1 kg Height:  5\' 4"  (162.6 cm)  BEHAVIORAL SYMPTOMS/MOOD NEUROLOGICAL BOWEL NUTRITION STATUS      Continent Diet(regular)  AMBULATORY STATUS COMMUNICATION OF NEEDS Skin   Extensive Assist Verbally Normal                       Personal Care Assistance Level of Assistance  Dressing, Bathing Bathing Assistance: Limited assistance   Dressing Assistance: Limited assistance     Functional Limitations Info  Hearing   Hearing Info: Impaired      SPECIAL CARE FACTORS FREQUENCY  PT (By licensed PT)     PT Frequency: 5 times A Week              Contractures Contractures Info: Not present    Additional Factors Info  Code Status Code Status Info: DNR             Current Medications (05/04/2018):  This is the current hospital active medication list Current Facility-Administered Medications  Medication Dose Route Frequency Provider Last Rate Last Dose  . acetaminophen (TYLENOL) tablet 650 mg  650 mg Oral Q6H PRN Alford Highland, MD    650 mg at 05/01/18 2147   Or  . acetaminophen (TYLENOL) suppository 650 mg  650 mg Rectal Q6H PRN Alford Highland, MD      . ALPRAZolam Prudy Feeler) tablet 0.25 mg  0.25 mg Oral TID Alford Highland, MD   0.25 mg at 05/04/18 0867  . amLODipine (NORVASC) tablet 5 mg  5 mg Oral Daily Alford Highland, MD   5 mg at 05/04/18 0834  . aspirin EC tablet 81 mg  81 mg Oral Daily Alford Highland, MD   81 mg at 05/04/18 6195  . [START ON 05/05/2018] citalopram (CELEXA) tablet 20 mg  20 mg Oral Daily Enid Baas, MD      . enoxaparin (LOVENOX) injection 40 mg  40 mg Subcutaneous Q24H Gouru, Aruna, MD   40 mg at 05/03/18 2127  . feeding supplement (ENSURE ENLIVE) (ENSURE ENLIVE) liquid 237 mL  237 mL Oral BID BM Gouru, Aruna, MD   237 mL at 05/04/18 0834  . levothyroxine (SYNTHROID, LEVOTHROID) tablet 50 mcg  50 mcg Oral Daily Alford Highland, MD   50 mcg at 05/04/18 0515  . montelukast (SINGULAIR) tablet 10 mg  10 mg Oral QHS Alford Highland, MD   10 mg at 05/03/18 2127  . ondansetron (ZOFRAN) tablet 4 mg  4 mg Oral Q6H PRN Alford Highland, MD       Or  . ondansetron [facility]) injection 4 mg  4 mg Intravenous Q6H PRN Wieting, Richard, MD      . potassium chloride SA (K-DUR,KLOR-CON) CR tablet 40 mEq  40 mEq Oral Q4H Enid Baas, MD   40 mEq at 05/04/18 0834  . sodium chloride (hypertonic) 3 % solution   Intravenous Continuous Kolluru, Sarath, MD         Discharge Medications: Please see discharge summary for a list of discharge medications.  Relevant Imaging Results:  Relevant Lab Results:   Additional Information    Barrie Dunker, RN

## 2018-05-04 NOTE — Progress Notes (Signed)
MEDICATION RELATED CONSULT NOTE - INITIAL   Pharmacy Consult for Monitoring Na Levels Indication: Hypertonic NaCl 3%   Allergies  Allergen Reactions  . Alendronate Other (See Comments)    GI upset  . Codeine Nausea And Vomiting  . Risedronate Other (See Comments)    GI Upset    Patient Measurements: Height: 5\' 4"  (162.6 cm) Weight: 128 lb (58.1 kg) IBW/kg (Calculated) : 54.7 Adjusted Body Weight:   Vital Signs: Temp: 97.4 F (36.3 C) (03/23 1526) Temp Source: Oral (03/23 1526) BP: 105/42 (03/23 1526) Pulse Rate: 63 (03/23 1526) Intake/Output from previous day: 03/22 0701 - 03/23 0700 In: 497.4 [P.O.:170; I.V.:327.4] Out: 1350 [Urine:1350] Intake/Output from this shift: No intake/output data recorded.  Labs: BMP Latest Ref Rng & Units 05/04/2018 05/04/2018 05/04/2018  Glucose 70 - 99 mg/dL - - -  BUN 8 - 23 mg/dL - - -  Creatinine 0.35 - 1.00 mg/dL - - -  Sodium 009 - 381 mmol/L 130(L) 128(L) 126(L)  Potassium 3.5 - 5.1 mmol/L - - -  Chloride 98 - 111 mmol/L - - -  CO2 22 - 32 mmol/L - - -  Calcium 8.9 - 10.3 mg/dL - - -    Estimated Creatinine Clearance: 34.5 mL/min (by C-G formula based on SCr of 0.86 mg/dL).  Medical History: Past Medical History:  Diagnosis Date  . Hearing impairment   . Hypertension   . Mild dementia (HCC)   . Thyroid disease    Assessment: Patient is a 85yo female admitted with severe hyponatremia. Patient is being initiated on NaCl 3% at 77ml/hr. Pharmacy is to monitor Na levels and f/u with provider for rapid changes in Na level.  3/22 12:23 Na 116, NaCl 3% started 3/22 14:04 Na 118 3/22 16:17 Na 120  3/22 20:39 Na 123  3/23 00:15 Na 125 Na continues to increase 2-3 mmol/L every 4 hours.  Na has increased 9 mmol/L in 12 hours.- Infusion decreased to 41ml/hr.  3/23 04:01 Na 125 3/23 07:49 Na 125 3/23 12:35 Na 126 3/23 16:41 Na 128 (rate 34ml./hr) 3/23 20:07 Na 130   Plan:  Will continue to monitor levels every 4 hours.  RPh  to evaluate Na level and contact MD if Na rises > in 2hr or > over 4 hours.   Goal is not to exceed 10-12 mmol/L increase in 24 hours.  Na has increased 5 mmol/L in ~ 22 hours.   Will continue current rate of 71ml/hr. Next Na check in 4 hours (@2000 )  May need to prompt lab/nursing to make sure levels are checked q4h.  Scherrie Gerlach, PharmD Clinical Pharmacist 05/04/2018 8:37 PM

## 2018-05-04 NOTE — Care Management Important Message (Signed)
Important Message  Patient Details  Name: Christina Wise MRN: [id number] Date of Birth: Dec 29, 1921   Medicare Important Message Given:  Yes    Olegario Messier A Azariah Latendresse 05/04/2018, 11:09 AM

## 2018-05-05 LAB — BASIC METABOLIC PANEL
Anion gap: 5 (ref 5–15)
BUN: 18 mg/dL (ref 8–23)
CO2: 21 mmol/L — ABNORMAL LOW (ref 22–32)
Calcium: 8 mg/dL — ABNORMAL LOW (ref 8.9–10.3)
Chloride: 108 mmol/L (ref 98–111)
Creatinine, Ser: 0.72 mg/dL (ref 0.44–1.00)
GFR calc non Af Amer: >60 mL/min (ref >60)
Glucose, Bld: 90 mg/dL (ref 70–99)
Potassium: 4.1 mmol/L (ref 3.5–5.1)
SODIUM: 134 mmol/L — AB (ref 135–145)

## 2018-05-05 LAB — SODIUM
Sodium: 132 mmol/L — ABNORMAL LOW (ref 135–145)
Sodium: 133 mmol/L — ABNORMAL LOW (ref 135–145)

## 2018-05-05 MED ORDER — SODIUM CHLORIDE 3 % IV SOLN
INTRAVENOUS | Status: DC
  Filled 2018-05-05 (×2): qty 500

## 2018-05-05 MED ORDER — ALPRAZOLAM 0.25 MG PO TABS: 0.2500 mg | ORAL_TABLET | Freq: Three times a day (TID) | ORAL | Status: DC | PRN

## 2018-05-05 MED ORDER — SODIUM CHLORIDE 0.9 % IV SOLN
INTRAVENOUS | Status: DC
  Administered 2018-05-05: 06:00:00 via INTRAVENOUS

## 2018-05-05 NOTE — Progress Notes (Signed)
MEDICATION RELATED CONSULT NOTE - INITIAL   Pharmacy Consult for Monitoring Na Levels Indication: Hypertonic NaCl 3%   Allergies  Allergen Reactions  . Alendronate Other (See Comments)    GI upset  . Codeine Nausea And Vomiting  . Risedronate Other (See Comments)    GI Upset    Patient Measurements: Height: 5\' 4"  (162.6 cm) Weight: 128 lb (58.1 kg) IBW/kg (Calculated) : 54.7 Adjusted Body Weight:   Vital Signs: Temp: 97.9 F (36.6 C) (03/23 2307) Temp Source: Oral (03/23 2307) BP: 126/48 (03/23 2307) Pulse Rate: 65 (03/23 2307) Intake/Output from previous day: 03/23 0701 - 03/24 0700 In: 240 [P.O.:240] Out: 350 [Urine:350] Intake/Output from this shift: Total I/O In: -  Out: 100 [Urine:100]  Labs: BMP Latest Ref Rng & Units 05/04/2018 05/04/2018 05/04/2018  Glucose 70 - 99 mg/dL - - -  BUN 8 - 23 mg/dL - - -  Creatinine 3.95 - 1.00 mg/dL - - -  Sodium 320 - 233 mmol/L 132(L) 130(L) 128(L)  Potassium 3.5 - 5.1 mmol/L - - -  Chloride 98 - 111 mmol/L - - -  CO2 22 - 32 mmol/L - - -  Calcium 8.9 - 10.3 mg/dL - - -    Estimated Creatinine Clearance: 34.5 mL/min (by C-G formula based on SCr of 0.86 mg/dL).  Medical History: Past Medical History:  Diagnosis Date  . Hearing impairment   . Hypertension   . Mild dementia (HCC)   . Thyroid disease    Assessment: Patient is a 85yo female admitted with severe hyponatremia. Patient is being initiated on NaCl 3% at 63ml/hr. Pharmacy is to monitor Na levels and f/u with provider for rapid changes in Na level.  3/22 12:23 Na 116, NaCl 3% started 3/22 14:04 Na 118 3/22 16:17 Na 120  3/22 20:39 Na 123  3/23 00:15 Na 125 Na continues to increase 2-3 mmol/L every 4 hours.  Na has increased 9 mmol/L in 12 hours.- Infusion decreased to 52ml/hr.  3/23 04:01 Na 125 3/23 07:49 Na 125 3/23 12:35 Na 126 3/23 16:41 Na 128 (rate 52ml./hr) 3/23 20:07 Na 130   Plan:  03/23 @ 2348 Na 132 WNL. Level has gone up by 7 mEq/L in the  past 24 hours this time. Will continue same rate and continue to monitor.  Thomasene Ripple, PharmD Clinical Pharmacist 05/05/2018 2:06 AM

## 2018-05-05 NOTE — Progress Notes (Signed)
Found HA in bed with patient, placed bilateral HA in storage cup on counter.

## 2018-05-05 NOTE — Progress Notes (Signed)
[facility] - [place] at [facility]   PATIENT NAME: Christina Wise    MR#:  [id number]  DATE OF BIRTH:  06/29/1921  SUBJECTIVE:  CHIEF COMPLAINT:   Chief Complaint  Patient presents with  . Weakness   -Off 3% saline now, sodium at 134. -Awaiting physical therapy input  REVIEW OF SYSTEMS:  Review of Systems  Constitutional: Positive for malaise/fatigue. Negative for chills and fever.  HENT: Positive for hearing loss. Negative for ear discharge and nosebleeds.   Eyes: Negative for blurred vision and double vision.  Respiratory: Negative for cough, shortness of breath and wheezing.   Cardiovascular: Negative for chest pain, palpitations and leg swelling.  Gastrointestinal: Negative for abdominal pain, constipation, diarrhea, nausea and vomiting.  Genitourinary: Negative for dysuria.  Musculoskeletal: Negative for myalgias.  Neurological: Negative for dizziness, seizures, weakness and headaches.  Psychiatric/Behavioral: Negative for depression.    DRUG ALLERGIES:   Allergies  Allergen Reactions  . Alendronate Other (See Comments)    GI upset  . Codeine Nausea And Vomiting  . Risedronate Other (See Comments)    GI Upset    VITALS:  Blood pressure (!) 150/52, pulse 63, temperature (!) 97.5 F (36.4 C), temperature source Oral, resp. rate 17, height 5\' 4"  (1.626 m), weight 58.1 kg, SpO2 99 %.  PHYSICAL EXAMINATION:  Physical Exam  GENERAL:  85 y.o.-year-old elderly patient lying in the bed with no acute distress.  EYES: Pupils equal, round, reactive to light and accommodation. No scleral icterus. Extraocular muscles intact.  HEENT: Head atraumatic, normocephalic. Oropharynx and nasopharynx clear.  Central head tremors are noted NECK:  Supple, no jugular venous distention. No thyroid enlargement, no tenderness.  LUNGS: Normal breath sounds bilaterally, no wheezing, rales,rhonchi or crepitation. No use of accessory muscles of respiration.  Dereased  bibasilar breath sounds CARDIOVASCULAR: S1, S2 normal. No  rubs, or gallops.  2/6 systolic murmur is present ABDOMEN: Soft, nontender, nondistended. Bowel sounds present. No organomegaly or mass.  EXTREMITIES: No pedal edema, cyanosis, or clubbing.  NEUROLOGIC: Cranial nerves II through XII are intact. Muscle strength 5/5 in all extremities.  Following simple commands.  Has global weakness. sensation intact. Gait not checked.  PSYCHIATRIC: The patient is alert and oriented x 2-3.  Very hard of hearing SKIN: No obvious rash, lesion, or ulcer.    LABORATORY PANEL:   CBC Recent Labs  Lab 05/02/18 0424  WBC 8.6  HGB 12.0  HCT 32.9*  PLT 190   ------------------------------------------------------------------------------------------------------------------  Chemistries  Recent Labs  Lab 05/01/18 1611  05/03/18 0504  05/05/18 0423 05/05/18 0754  NA 109*   < > 117*   < > 134* 133*  K 3.4*   < >  --    < > 4.1  --   CL 73*   < >  --    < > 108  --   CO2 22   < >  --    < > 21*  --   GLUCOSE 110*   < >  --    < > 90  --   BUN 26*   < >  --    < > 18  --   CREATININE 1.02*   < >  --    < > 0.72  --   CALCIUM 8.6*   < >  --    < > 8.0*  --   MG  --    < > 2.1  --   --   --  AST 38  --   --   --   --   --   ALT 18  --   --   --   --   --   ALKPHOS 130*  --   --   --   --   --   BILITOT 1.9*  --   --   --   --   --    < > = values in this interval not displayed.   ------------------------------------------------------------------------------------------------------------------  Cardiac Enzymes Recent Labs  Lab 05/01/18 1611  TROPONINI 0.05*   ------------------------------------------------------------------------------------------------------------------  RADIOLOGY:  No results found.  EKG:   Orders placed or performed during the hospital encounter of 05/01/18  . ED EKG  . ED EKG    ASSESSMENT AND PLAN:   85 year old female with past medical history significant  for hypertension, hypothyroidism who lives at home by herself brought in secondary to altered mental status and noted to have hyponatremia  1.  Acute hyponatremia on presentation-secondary to poor oral intake for a few weeks prior to admission. -Also patient was on hydrochlorothiazide at home which has been appropriately discontinued. -Sodium was 109 on admission.  Received both normal saline and 3% saline and sodium is currently at 134. -Fluids have been discontinued -Appreciate nephrology consult  2.  Hypokalemia- replaced  3.  Hypertension-Norvasc  4.  Hypothyroidism-Synthroid  5.  Recent fall last month-patient had a fall in a long time in February 2019, came to the ER had acute proximal humeral comminuted fracture, she was placed in a sling and discharged home with home health.  No further falls reported. -Physical therapy to work with the patient today.  6. Dementia- has underlying dementia, oriented x2, CT head with chronic microvascular ischemic changes and atrophy  6.  DVT prophylaxis-Lovenox  Might need placement.   All the records are reviewed and case discussed with Care Management/Social Workerr. Management plans discussed with the patient, family and they are in agreement.  CODE STATUS: DNR  TOTAL TIME TAKING CARE OF THIS PATIENT: 37 minutes.   POSSIBLE D/C IN 1-2 DAYS, DEPENDING ON CLINICAL CONDITION.   Enid Baas M.D on 05/05/2018 at 9:59 AM  Between 7am to 6pm - Pager - [phone]  After 6pm go to [email or web address] - Social research officer, government  [facility]  Office  [phone]  CC: Primary care physician; Kandyce Rud, MD

## 2018-05-05 NOTE — TOC Progression Note (Signed)
Transition of Care (TOC) - Progression Note   Faxed FL2, H&P and progress note to PASRR at [phone] reference number [id number]   Patient Details  Name: Christina Wise MRN: [id number] Date of Birth: 1922/01/17  Transition of Care [facility]) CM/SW Contact  Barrie Dunker, RN Phone Number: 05/05/2018, 8:42 AM  Clinical Narrative:       Expected Discharge Plan: Skilled Nursing Facility Barriers to Discharge: SNF Pending bed offer, Continued Medical Work up  Expected Discharge Plan and Services Expected Discharge Plan: Skilled Nursing Facility       Living arrangements for the past 2 months: Single Family Home                           Social Determinants of Health (SDOH) Interventions    Readmission Risk Interventions No flowsheet data found.

## 2018-05-05 NOTE — Progress Notes (Signed)
MEDICATION RELATED CONSULT NOTE - INITIAL   Pharmacy Consult for Monitoring Na Levels Indication: Hypertonic NaCl 3%   Allergies  Allergen Reactions  . Alendronate Other (See Comments)    GI upset  . Codeine Nausea And Vomiting  . Risedronate Other (See Comments)    GI Upset    Patient Measurements: Height: 5\' 4"  (162.6 cm) Weight: 128 lb (58.1 kg) IBW/kg (Calculated) : 54.7 Adjusted Body Weight:   Vital Signs: Temp: 97.9 F (36.6 C) (03/23 2307) Temp Source: Oral (03/23 2307) BP: 126/48 (03/23 2307) Pulse Rate: 65 (03/23 2307) Intake/Output from previous day: 03/23 0701 - 03/24 0700 In: 240 [P.O.:240] Out: 350 [Urine:350] Intake/Output from this shift: Total I/O In: -  Out: 100 [Urine:100]  Labs: BMP Latest Ref Rng & Units 05/05/2018 05/04/2018 05/04/2018  Glucose 70 - 99 mg/dL 90 - -  BUN 8 - 23 mg/dL 18 - -  Creatinine 9.47 - 1.00 mg/dL 6.54 - -  Sodium 650 - 145 mmol/L 134(L) 132(L) 130(L)  Potassium 3.5 - 5.1 mmol/L 4.1 - -  Chloride 98 - 111 mmol/L 108 - -  CO2 22 - 32 mmol/L 21(L) - -  Calcium 8.9 - 10.3 mg/dL 8.0(L) - -    Estimated Creatinine Clearance: 37.1 mL/min (by C-G formula based on SCr of 0.72 mg/dL).  Medical History: Past Medical History:  Diagnosis Date  . Hearing impairment   . Hypertension   . Mild dementia (HCC)   . Thyroid disease    Assessment: Patient is a 85yo female admitted with severe hyponatremia. Patient is being initiated on NaCl 3% at 49ml/hr. Pharmacy is to monitor Na levels and f/u with provider for rapid changes in Na level.  3/22 12:23 Na 116, NaCl 3% started 3/22 14:04 Na 118 3/22 16:17 Na 120  3/22 20:39 Na 123  3/23 00:15 Na 125 Na continues to increase 2-3 mmol/L every 4 hours.  Na has increased 9 mmol/L in 12 hours.- Infusion decreased to 108ml/hr.  3/23 04:01 Na 125 3/23 07:49 Na 125 3/23 12:35 Na 126 3/23 16:41 Na 128 (rate 59ml./hr) 3/23 20:07 Na 130 3/23 23:48 Na 132 3/23 04:30 Na 134  Plan:  03/23 @  0430 Na 134 WNL. Level has gone up by 9 mEq/L in the past 24 hours this time. Will stop rate and continue to monitor.  Thomasene Ripple, PharmD Clinical Pharmacist 05/05/2018 5:10 AM

## 2018-05-05 NOTE — Progress Notes (Signed)
Physical Therapy Treatment Patient Details Name: Christina Wise MRN: [id number] DOB: [birth year] Today's Date: 05/05/2018    History of Present Illness 85 y.o. female with a PMHx of hearing impairment, hypertension, dementia, humeral fracture, who was admitted to [facility] on 05/01/2018 for evaluation of generalized weakness.  She was found to be profoundly hyponatremic with a serum sodium of 109, Na+ numbers have recovered.    PT Comments    Pt sleeping on arrival and difficult to get going secondary to being hard of hearing, but she was able to show some participation despite confusion and needing very consistent cuing/encouragement.   Pt appeared to have some orthostatic hypotension (Unsafe to get dynamap during sitting, unable to test BP), with lightheadedness and generally looking poorly t/o the effort where she needed constant assist to avoid falling back into bed.  She was able to do some minimal LE supine exercises, but needed a lot of cuing to insure appropriate execution.   Follow Up Recommendations  SNF     Equipment Recommendations  None recommended by PT    Recommendations for Other Services       Precautions / Restrictions Precautions Precautions: Fall;Shoulder Type of Shoulder Precautions: per MD gentle AROM and PROM as able, R shoulder Restrictions RUE Weight Bearing: Weight bearing as tolerated    Mobility  Bed Mobility Overal bed mobility: Needs Assistance Bed Mobility: Supine to Sit;Sit to Supine     Supine to sit: Mod assist;Max assist Sit to supine: Mod assist;Max assist   General bed mobility comments: Pt needing at least min assist to maintain sitting balance and often much more.  Appeared to have some lightheadedness (unable to truly verbalize) that did not subside even with a few minutes of supported sitting  Transfers                 General transfer comment: Unable to maitnain sitting w/o significant assist, some lightheadedness, tremor and  inability to make small safety adjustments with cuing, attempt at standing deferred for safety  Ambulation/Gait                 Stairs             Wheelchair Mobility    Modified Rankin (Stroke Patients Only)       Balance Overall balance assessment: Needs assistance Sitting-balance support: Feet supported;Bilateral upper extremity supported Sitting balance-Leahy Scale: Zero Sitting balance - Comments: Pt unable to maintain sitting w/o direct assist                                    Cognition Arousal/Alertness: Lethargic Behavior During Therapy: Flat affect Overall Cognitive Status: History of cognitive impairments - at baseline                                 General Comments: Pt minimally interactive, showed little in the way of situational awareness      Exercises General Exercises - Lower Extremity Ankle Circles/Pumps: PROM;10 reps Short Arc Quad: AAROM;5 reps Heel Slides: AAROM;5 reps Hip ABduction/ADduction: AAROM;5 reps Straight Leg Raises: AAROM;5 reps    General Comments General comments (skin integrity, edema, etc.): BP after returning to supine from sitting 125/49       Pertinent Vitals/Pain Pain Assessment: (unable to rate, moans with most LE P/AAROM )    Home Living  Prior Function            PT Goals (current goals can now be found in the care plan section) Progress towards PT goals: Not progressing toward goals - comment(pt needed more assistance with mobility than yesterday)    Frequency    Min 2X/week      PT Plan Current plan remains appropriate    Co-evaluation              AM-PAC PT "6 Clicks" Mobility   Outcome Measure  Help needed turning from your back to your side while in a flat bed without using bedrails?: Total Help needed moving from lying on your back to sitting on the side of a flat bed without using bedrails?: Total Help needed moving to  and from a bed to a chair (including a wheelchair)?: Total Help needed standing up from a chair using your arms (e.g., wheelchair or bedside chair)?: Total Help needed to walk in hospital room?: Total Help needed climbing 3-5 steps with a railing? : Total 6 Click Score: 6    End of Session   Activity Tolerance: Patient limited by fatigue;Other (comment) Patient left: in bed;with bed alarm set Nurse Communication: Mobility status PT Visit Diagnosis: Unsteadiness on feet (R26.81);Muscle weakness (generalized) (M62.81);History of falling (Z91.81);Other abnormalities of gait and mobility (R26.89);Pain Pain - Right/Left: Right Pain - part of body: Shoulder     Time: 6659-9357 PT Time Calculation (min) (ACUTE ONLY): 16 min  Charges:  $Therapeutic Activity: 8-22 mins                     Malachi Pro, DPT 05/05/2018, 3:46 PM

## 2018-05-05 NOTE — TOC Progression Note (Signed)
Transition of Care (TOC) - Progression Note  Spoke with Son Delton See, He has chosen [employer], I called Armed forces training and education officer at [employer] and let her know that they accepted, she stated that they would start authorization request first thing this morning  Patient Details  Name: KIMYATTA SHINABERRY MRN: [id number] Date of Birth: 1921-03-30  Transition of Care [facility]) CM/SW Contact  Barrie Dunker, RN Phone Number: 05/05/2018, 8:29 AM  Clinical Narrative:       Expected Discharge Plan: Skilled Nursing Facility Barriers to Discharge: SNF Pending bed offer, Continued Medical Work up  Expected Discharge Plan and Services Expected Discharge Plan: Skilled Nursing Facility       Living arrangements for the past 2 months: Single Family Home                           Social Determinants of Health (SDOH) Interventions    Readmission Risk Interventions No flowsheet data found.

## 2018-05-05 NOTE — Progress Notes (Signed)
[facility]  ROUNDING NOTE   Subjective:   Na 133 - holding hypertonic saline   Objective:  Vital signs in last 24 hours:  Temp:  [97.4 F (36.3 C)-97.9 F (36.6 C)] 97.5 F (36.4 C) (03/24 0718) Pulse Rate:  [59-65] 63 (03/24 0718) Resp:  [11-17] 17 (03/24 0718) BP: (105-150)/(42-52) 150/52 (03/24 0718) SpO2:  [99 %-100 %] 99 % (03/24 0718)  Weight change:  Filed Weights   05/01/18 1609  Weight: 58.1 kg    Intake/Output: I/O last 3 completed shifts: In: 951.8 [P.O.:240; I.V.:711.8] Out: 1150 [Urine:1150]   Intake/Output this shift:  No intake/output data recorded.  Physical Exam: General: No acute distress  Head: Normocephalic, atraumatic. Moist oral mucosal membranes  Eyes: Anicteric  Neck: Supple, trachea midline  Lungs:  Clear to auscultation, normal effort  Heart: regular  Abdomen:  Soft, nontender, bowel sounds present  Extremities: No peripheral edema.  Neurologic: Alert to self only  Skin: No lesions       Basic Metabolic Panel: Recent Labs  Lab 05/01/18 1611 05/01/18 1954  05/02/18 0500  05/03/18 0504  05/04/18 0401  05/04/18 1641 05/04/18 2007 05/04/18 2348 05/05/18 0423 05/05/18 0754  NA 109* 112*   < > 112*   < > 117*   < > 125*   < > 128* 130* 132* 134* 133*  K 3.4*  --   --  3.3*  --   --   --  3.0*  --   --   --   --  4.1  --   CL 73*  --   --  81*  --   --   --  95*  --   --   --   --  108  --   CO2 22  --   --  23  --   --   --  22  --   --   --   --  21*  --   GLUCOSE 110*  --   --  82  --   --   --  80  --   --   --   --  90  --   BUN 26*  --   --  22  --   --   --  20  --   --   --   --  18  --   CREATININE 1.02*  --   --  0.97  --   --   --  0.86  --   --   --   --  0.72  --   CALCIUM 8.6*  --   --  8.1*  --   --   --  7.9*  --   --   --   --  8.0*  --   MG  --  1.6*  --   --   --  2.1  --   --   --   --   --   --   --   --   PHOS  --  2.8  --   --   --   --   --   --   --   --   --   --   --   --    < > = values  in this interval not displayed.    Liver Function Tests: Recent Labs  Lab 05/01/18 1611  AST 38  ALT 18  ALKPHOS 130*  BILITOT 1.9*  PROT 6.2*  ALBUMIN 4.2   No results for input(s): LIPASE, AMYLASE in the last 168 hours. Recent Labs  Lab 05/01/18 1954 05/02/18 0424  AMMONIA <9* <9*    CBC: Recent Labs  Lab 05/01/18 1611 05/02/18 0424  WBC 14.8* 8.6  HGB 13.0 12.0  HCT 34.8* 32.9*  MCV 78.6* 80.2  PLT 270 190    Cardiac Enzymes: Recent Labs  Lab 05/01/18 1611  TROPONINI 0.05*    BNP: Invalid input(s): POCBNP  CBG: Recent Labs  Lab 05/01/18 1635 05/01/18 2013  GLUCAP 113* 104*    Microbiology: Results for orders placed or performed during the hospital encounter of 05/01/18  Urine culture     Status: None   Collection Time: 05/01/18  4:16 PM  Result Value Ref Range Status   Specimen Description   Final    URINE, RANDOM Performed at [facility], [address], [place], [place] [address]    Special Requests   Final    NONE Performed at [facility], [address], [place], [place] [address]    Culture   Final    NO GROWTH Performed at [facility], [address], [place], [place] [address]    Report Status 05/03/2018 FINAL  Final  MRSA PCR Screening     Status: None   Collection Time: 05/02/18  5:44 AM  Result Value Ref Range Status   MRSA by PCR NEGATIVE NEGATIVE Final    Comment:        The GeneXpert MRSA Assay (FDA approved for NASAL specimens only), is one component of a comprehensive MRSA colonization surveillance program. It is not intended to diagnose MRSA infection nor to guide or monitor treatment for MRSA infections. Performed at [facility], [address] [place], [place] [address]     Coagulation Studies: No results for input(s): LABPROT, INR in the last 72 hours.  Urinalysis: No results for input(s): COLORURINE, LABSPEC, PHURINE, GLUCOSEU, HGBUR, BILIRUBINUR, KETONESUR,  PROTEINUR, UROBILINOGEN, NITRITE, LEUKOCYTESUR in the last 72 hours.  Invalid input(s): APPERANCEUR    Imaging: No results found.   Medications:    . amLODipine  5 mg Oral Daily  . aspirin EC  81 mg Oral Daily  . citalopram  20 mg Oral Daily  . enoxaparin (LOVENOX) injection  40 mg Subcutaneous Q24H  . feeding supplement (ENSURE ENLIVE)  237 mL Oral BID BM  . levothyroxine  50 mcg Oral Daily  . montelukast  10 mg Oral QHS   acetaminophen **OR** acetaminophen, ALPRAZolam, ondansetron **OR** ondansetron (ZOFRAN) IV  Assessment/ Plan:  85 y.o. female with a PMHx of hearing impairment, hypertension, dementia, humeral fracture, hypothyroidism, who was admitted to [facility] on 05/01/2018 for Hyponatremia. Sodium 109  - Completed course of 3% saline infusion.   - Discontinue serial sodium checks.  - Currently on an SSRI   LOS: 4 Christina Wise 3/24/202010:41 AM

## 2018-05-05 NOTE — Progress Notes (Addendum)
MEDICATION RELATED CONSULT NOTE - INITIAL   Pharmacy Consult for Monitoring Na Levels Indication: Hypertonic NaCl 3%   Allergies  Allergen Reactions  . Alendronate Other (See Comments)    GI upset  . Codeine Nausea And Vomiting  . Risedronate Other (See Comments)    GI Upset    Patient Measurements: Height: 5\' 4"  (162.6 cm) Weight: 128 lb (58.1 kg) IBW/kg (Calculated) : 54.7 Adjusted Body Weight:   Vital Signs: Temp: 97.5 F (36.4 C) (03/24 0718) Temp Source: Oral (03/24 0718) BP: 150/52 (03/24 0718) Pulse Rate: 63 (03/24 0718) Intake/Output from previous day: 03/23 0701 - 03/24 0700 In: 704 [P.O.:240; I.V.:464] Out: 850 [Urine:850] Intake/Output from this shift: No intake/output data recorded.  Labs: BMP Latest Ref Rng & Units 05/05/2018 05/05/2018 05/04/2018  Glucose 70 - 99 mg/dL - 90 -  BUN 8 - 23 mg/dL - 18 -  Creatinine 3.54 - 1.00 mg/dL - 6.56 -  Sodium 812 - 145 mmol/L 133(L) 134(L) 132(L)  Potassium 3.5 - 5.1 mmol/L - 4.1 -  Chloride 98 - 111 mmol/L - 108 -  CO2 22 - 32 mmol/L - 21(L) -  Calcium 8.9 - 10.3 mg/dL - 8.0(L) -    Estimated Creatinine Clearance: 37.1 mL/min (by C-G formula based on SCr of 0.72 mg/dL).  Medical History: Past Medical History:  Diagnosis Date  . Hearing impairment   . Hypertension   . Mild dementia (HCC)   . Thyroid disease    Assessment: Patient is a 85yo female admitted with severe hyponatremia. Patient is being initiated on NaCl 3% at 48ml/hr. Pharmacy is to monitor Na levels and f/u with provider for rapid changes in Na level.  3/22 12:23 Na 116, NaCl 3% started 3/22 14:04 Na 118 3/22 16:17 Na 120  3/22 20:39 Na 123  3/23 00:15 Na 125 Na continues to increase 2-3 mmol/L every 4 hours.  Na has increased 9 mmol/L in 12 hours.- Infusion decreased to 67ml/hr.  3/23 04:01 Na 125 3/23 07:49 Na 125 3/23 12:35 Na 126 3/23 16:41 Na 128 (rate 53ml./hr) 3/23 20:07 Na 130 3/23 23:48 Na 132 3/24 04:30 Na 134 3/24 07:54 Na  133  RPh to evaluate Na level and contact MD if Na rises > in 2hr or > over 4 hours.   Goal is not to exceed 10-12 mmol/L increase in 24 hours.    Plan:  Will continue to hold any current saline infusions. Consulted with nephrology. Nephrologist will review and order any new infusions as deemed necessary at this time.Level has gone up by 8 mEq/L in the past 24 hours. Will check Na levels in 4 hours(@1200 ). Will continue to monitor and update.  Bettey Costa, PharmD Clinical Pharmacist 05/05/2018 8:51 AM

## 2018-05-06 LAB — BASIC METABOLIC PANEL
ANION GAP: 6 (ref 5–15)
BUN: 24 mg/dL — ABNORMAL HIGH (ref 8–23)
CO2: 24 mmol/L (ref 22–32)
Calcium: 8.8 mg/dL — ABNORMAL LOW (ref 8.9–10.3)
Chloride: 104 mmol/L (ref 98–111)
Creatinine, Ser: 0.7 mg/dL (ref 0.44–1.00)
GFR calc Af Amer: >60 mL/min (ref >60)
GFR calc non Af Amer: >60 mL/min (ref >60)
Glucose, Bld: 114 mg/dL — ABNORMAL HIGH (ref 70–99)
Potassium: 4.1 mmol/L (ref 3.5–5.1)
Sodium: 134 mmol/L — ABNORMAL LOW (ref 135–145)

## 2018-05-06 MED ORDER — ALPRAZOLAM 0.25 MG PO TABS: 0.2500 mg | ORAL_TABLET | Freq: Two times a day (BID) | ORAL | 0 refills | Status: DC | PRN

## 2018-05-06 MED ORDER — TRAMADOL HCL 50 MG PO TABS: 50.0000 mg | ORAL_TABLET | Freq: Two times a day (BID) | ORAL | 0 refills | Status: AC | PRN

## 2018-05-06 MED ORDER — ENSURE ENLIVE PO LIQD: 237.0000 mL | Freq: Two times a day (BID) | ORAL | 12 refills | Status: DC

## 2018-05-06 MED ORDER — TRAMADOL HCL 50 MG PO TABS: 50.0000 mg | ORAL_TABLET | Freq: Two times a day (BID) | ORAL | 0 refills | Status: DC | PRN

## 2018-05-06 NOTE — Progress Notes (Signed)
[employer] [facility] [facility] [place] [facility]   PATIENT NAME: Christina Wise    MR#:  [id number]  DATE OF BIRTH:  May 27, 1921  SUBJECTIVE:  CHIEF COMPLAINT:   Chief Complaint  Patient presents with  . Weakness   - sodium is stable at 134.  Has been off of 3% saline for more than 24 hours now. -Awaiting placement -Acute urinary retention after Foley catheter was removed  REVIEW OF SYSTEMS:  Review of Systems  Constitutional: Positive for malaise/fatigue. Negative for chills and fever.  HENT: Positive for hearing loss. Negative for ear discharge and nosebleeds.   Eyes: Negative for blurred vision and double vision.  Respiratory: Negative for cough, shortness of breath and wheezing.   Cardiovascular: Negative for chest pain, palpitations and leg swelling.  Gastrointestinal: Negative for abdominal pain, constipation, diarrhea, nausea and vomiting.  Genitourinary: Negative for dysuria.  Musculoskeletal: Negative for myalgias.  Neurological: Negative for dizziness, seizures, weakness and headaches.  Psychiatric/Behavioral: Negative for depression.    DRUG ALLERGIES:   Allergies  Allergen Reactions  . Alendronate Other (See Comments)    GI upset  . Codeine Nausea And Vomiting  . Risedronate Other (See Comments)    GI Upset    VITALS:  Blood pressure (!) 145/50, pulse 64, temperature (!) 97.4 F (36.3 C), temperature source Oral, resp. rate 10, height 5\' 4"  (1.626 m), weight 58.1 kg, SpO2 99 %.  PHYSICAL EXAMINATION:  Physical Exam  GENERAL:  85 y.o.-year-old elderly patient lying in the bed with no acute distress.  Very hard of hearing EYES: Pupils equal, round, reactive to light and accommodation. No scleral icterus. Extraocular muscles intact.  HEENT: Head atraumatic, normocephalic. Oropharynx and nasopharynx clear.  Central head tremors are noted NECK:  Supple, no jugular venous distention. No thyroid enlargement, no tenderness.  LUNGS: Normal breath  sounds bilaterally, no wheezing, rales,rhonchi or crepitation. No use of accessory muscles of respiration.  Dereased bibasilar breath sounds CARDIOVASCULAR: S1, S2 normal. No  rubs, or gallops.  2/6 systolic murmur is present ABDOMEN: Soft, nontender, nondistended. Bowel sounds present. No organomegaly or mass.  EXTREMITIES: No pedal edema, cyanosis, or clubbing.  NEUROLOGIC: Cranial nerves II through XII are intact. Muscle strength 5/5 in all extremities.  Following simple commands.  Has global weakness. sensation intact. Gait not checked.  PSYCHIATRIC: The patient is alert and oriented x 2-3.  Very hard of hearing SKIN: No obvious rash, lesion, or ulcer.    LABORATORY PANEL:   CBC Recent Labs  Lab 05/02/18 0424  WBC 8.6  HGB 12.0  HCT 32.9*  PLT 190   ------------------------------------------------------------------------------------------------------------------  Chemistries  Recent Labs  Lab 05/01/18 1611  05/03/18 0504  05/06/18 0311  NA 109*   < > 117*   < > 134*  K 3.4*   < >  --    < > 4.1  CL 73*   < >  --    < > 104  CO2 22   < >  --    < > 24  GLUCOSE 110*   < >  --    < > 114*  BUN 26*   < >  --    < > 24*  CREATININE 1.02*   < >  --    < > 0.70  CALCIUM 8.6*   < >  --    < > 8.8*  MG  --    < > 2.1  --   --   AST 38  --   --   --   --  ALT 18  --   --   --   --   ALKPHOS 130*  --   --   --   --   BILITOT 1.9*  --   --   --   --    < > = values in this interval not displayed.   ------------------------------------------------------------------------------------------------------------------  Cardiac Enzymes Recent Labs  Lab 05/01/18 1611  TROPONINI 0.05*   ------------------------------------------------------------------------------------------------------------------  RADIOLOGY:  No results found.  EKG:   Orders placed or performed during the hospital encounter of 05/01/18  . ED EKG  . ED EKG    ASSESSMENT AND PLAN:   85 year old female  with past medical history significant for hypertension, hypothyroidism who lives at home by herself brought in secondary to altered mental status and noted to have hyponatremia  1.  Acute hyponatremia on presentation-secondary to poor oral intake for a few weeks prior to admission. -Also patient was on hydrochlorothiazide at home which has been appropriately discontinued. -Sodium was 109 on admission.  Received both normal saline and 3% saline and sodium is currently at 134.  Has been off of fluids for more than 24 hours now -Appreciate nephrology consult  2.  Hypokalemia- replaced  3.  Hypertension-Norvasc  4.  Hypothyroidism-Synthroid  5.  Recent fall last month-patient had a fall in a long time in February 2019, came to the ER had acute proximal humeral comminuted fracture, she was placed in a sling and discharged home with home health.  No further falls reported. -Physical therapy to work with the patient today.  6. Dementia- has underlying dementia, oriented x2, CT head with chronic microvascular ischemic changes and atrophy  7.  Acute urinary retention-patient had Foley catheter for the initial days in the hospitalization.  Foley catheter has been removed and she had needed in and out catheter once.  Continue to do a bladder scan every 8 hours and if greater than 300 cc urine in the bladder, do an in and out catheter. -If patient is requiring in and out catheters were greater than 3 times, insert a Foley catheter and do a voiding trial in 3 days.  Physical therapy recommended placement.  Insurance authorization received.  Patient will go to [facility] once passr is received -We will need palliative care consult at the facility due to dementia, poor oral take.   All the records are reviewed and case discussed with Care Management/Social Workerr. Management plans discussed with the patient, family and they are in agreement.  CODE STATUS: DNR  TOTAL TIME TAKING CARE OF THIS  PATIENT: 37 minutes.   POSSIBLE D/C IN 1-2 DAYS, DEPENDING ON CLINICAL CONDITION.   Enid Baas M.D on 05/06/2018 at 11:25 AM  Between 7am to 6pm - Pager - [phone]  After 6pm go to [email or web address] - Social research officer, government  [facility]  Office  [phone]  CC: Primary care physician; Kandyce Rud, MD

## 2018-05-06 NOTE — TOC Progression Note (Signed)
Transition of Care [facility]) - Progression Note    Patient Details  Name: Christina Wise MRN: [id number] Date of Birth: 03-Jan-1922  Transition [facility]) CM/SW Contact  Barrie Dunker, RN Phone Number: 05/06/2018, 8:39 AM  Clinical Narrative:     Received Auth from [facility] Auth number [id number] Number to call for report is [phone]  No PASSR number, I called PASSR to inquire and they are not accepting calls only email, I sent an email requesting to get status on the PASSP number Expected Discharge Plan: Skilled Nursing Facility Barriers to Discharge: SNF Pending bed offer, Continued Medical Work up  Expected Discharge Plan and Services Expected Discharge Plan: Skilled Nursing Facility       Living arrangements for the past 2 months: Single Family Home                           Social Determinants of Health (SDOH) Interventions    Readmission Risk Interventions No flowsheet data found.

## 2018-05-06 NOTE — Progress Notes (Signed)
Pt has not voided bladder scan 464. Dr. Paged awaiting response.

## 2018-05-06 NOTE — Progress Notes (Signed)
Nutrition Follow-up  RD working remotely.  DOCUMENTATION CODES:   Severe malnutrition in context of social or environmental circumstances  INTERVENTION:  Continue Ensure Enlive po BID, each supplement provides 350 kcal and 20 grams of protein.  Continue Magic cup TID with meals, each supplement provides 290 kcal and 9 grams of protein.  Consider addition of bowel regimen.  NUTRITION DIAGNOSIS:   Severe Malnutrition related to social / environmental circumstances(advanced age, inadequate oral intake) as evidenced by severe fat depletion, moderate muscle depletion, severe muscle depletion.  Ongoing - addressing with nutrition interventions.  GOAL:   Patient will meet greater than or equal to 90% of their needs  Progressing.  MONITOR:   PO intake, Supplement acceptance, Labs, Weight trends, I & O's  REASON FOR ASSESSMENT:   Malnutrition Screening Tool    ASSESSMENT:   85 year old female with PMHx of dementia, hearing impairment, hypothyroidism, HTN admitted with severe hyponatremia, weakness, acute metabolic encephalopathy.  Per meal documentation in chart patient is eating 30-60% of most meals but did eat 100% of dinner on 3/22. She is drinking 1-2 bottles of Ensure per day. Also receiving Magic Cup on trays. Patient now off hypertonic saline. Noted discharge planning for SNF.  Medications reviewed and include: levothyroxine.  Labs reviewed: Sodium 134, BUN 24.  Diet Order:   Diet Order            Diet regular Room service appropriate? Yes with Assist; Fluid consistency: Thin  Diet effective now             EDUCATION NEEDS:   Not appropriate for education at this time  Skin:  Skin Assessment: Reviewed RN Assessment(ecchymosis)  Last BM:  05/03/2018 per chart  Height:   Ht Readings from Last 1 Encounters:  05/01/18 5\' 4"  (1.626 m)   Weight:   Wt Readings from Last 1 Encounters:  05/01/18 58.1 kg   Ideal Body Weight:  54.5 kg  BMI:  Body mass index  is 21.97 kg/m.  Estimated Nutritional Needs:   Kcal:  1200-1400  Protein:  60-70 grams  Fluid:  1.2-1.4 L/day  Helane Rima, MS, RD, LDN Office: [phone] Pager: [phone] After Hours/Weekend Pager: [phone]

## 2018-05-06 NOTE — Progress Notes (Signed)
[facility]  ROUNDING NOTE   Subjective:   Na 134.   Does not have hearing aides in.   Objective:  Vital signs in last 24 hours:  Temp:  [97.4 F (36.3 C)-98 F (36.7 C)] 97.4 F (36.3 C) (03/25 0723) Pulse Rate:  [63-65] 64 (03/25 0723) Resp:  [10-17] 10 (03/25 0723) BP: (128-149)/(50-60) 145/50 (03/25 0723) SpO2:  [95 %-99 %] 99 % (03/25 0723)  Weight change:  Filed Weights   05/01/18 1609  Weight: 58.1 kg    Intake/Output: I/O last 3 completed shifts: In: 464 [I.V.:464] Out: 1100 [Urine:1100]   Intake/Output this shift:  Total I/O In: 120 [P.O.:120] Out: -   Physical Exam: General: No acute distress  Head: Normocephalic, atraumatic. Moist oral mucosal membranes  Eyes: Anicteric  Neck: Supple, trachea midline  Lungs:  Clear to auscultation, normal effort  Heart: regular  Abdomen:  Soft, nontender, bowel sounds present  Extremities: No peripheral edema.  Neurologic: Alert to self only  Skin: No lesions       Basic Metabolic Panel: Recent Labs  Lab 05/01/18 1611 05/01/18 1954  05/02/18 0500  05/03/18 0504  05/04/18 0401  05/04/18 2007 05/04/18 2348 05/05/18 0423 05/05/18 0754 05/06/18 0311  NA 109* 112*   < > 112*   < > 117*   < > 125*   < > 130* 132* 134* 133* 134*  K 3.4*  --   --  3.3*  --   --   --  3.0*  --   --   --  4.1  --  4.1  CL 73*  --   --  81*  --   --   --  95*  --   --   --  108  --  104  CO2 22  --   --  23  --   --   --  22  --   --   --  21*  --  24  GLUCOSE 110*  --   --  82  --   --   --  80  --   --   --  90  --  114*  BUN 26*  --   --  22  --   --   --  20  --   --   --  18  --  24*  CREATININE 1.02*  --   --  0.97  --   --   --  0.86  --   --   --  0.72  --  0.70  CALCIUM 8.6*  --   --  8.1*  --   --   --  7.9*  --   --   --  8.0*  --  8.8*  MG  --  1.6*  --   --   --  2.1  --   --   --   --   --   --   --   --   PHOS  --  2.8  --   --   --   --   --   --   --   --   --   --   --   --    < > = values in  this interval not displayed.    Liver Function Tests: Recent Labs  Lab 05/01/18 1611  AST 38  ALT 18  ALKPHOS 130*  BILITOT 1.9*  PROT 6.2*  ALBUMIN  4.2   No results for input(s): LIPASE, AMYLASE in the last 168 hours. Recent Labs  Lab 05/01/18 1954 05/02/18 0424  AMMONIA <9* <9*    CBC: Recent Labs  Lab 05/01/18 1611 05/02/18 0424  WBC 14.8* 8.6  HGB 13.0 12.0  HCT 34.8* 32.9*  MCV 78.6* 80.2  PLT 270 190    Cardiac Enzymes: Recent Labs  Lab 05/01/18 1611  TROPONINI 0.05*    BNP: Invalid input(s): POCBNP  CBG: Recent Labs  Lab 05/01/18 1635 05/01/18 2013  GLUCAP 113* 104*    Microbiology: Results for orders placed or performed during the hospital encounter of 05/01/18  Urine culture     Status: None   Collection Time: 05/01/18  4:16 PM  Result Value Ref Range Status   Specimen Description   Final    URINE, RANDOM Performed at [facility], [address] [place], [place] [address]    Special Requests   Final    NONE Performed at [facility], [address], [place], [place] [address]    Culture   Final    NO GROWTH Performed at [facility], [address] [place], [place] [address]    Report Status 05/03/2018 FINAL  Final  MRSA PCR Screening     Status: None   Collection Time: 05/02/18  5:44 AM  Result Value Ref Range Status   MRSA by PCR NEGATIVE NEGATIVE Final    Comment:        The GeneXpert MRSA Assay (FDA approved for NASAL specimens only), is one component of a comprehensive MRSA colonization surveillance program. It is not intended to diagnose MRSA infection nor to guide or monitor treatment for MRSA infections. Performed at [facility], [address] [place], [place] [address]     Coagulation Studies: No results for input(s): LABPROT, INR in the last 72 hours.  Urinalysis: No results for input(s): COLORURINE, LABSPEC, PHURINE, GLUCOSEU, HGBUR, BILIRUBINUR, KETONESUR,  PROTEINUR, UROBILINOGEN, NITRITE, LEUKOCYTESUR in the last 72 hours.  Invalid input(s): APPERANCEUR    Imaging: No results found.   Medications:    . amLODipine  5 mg Oral Daily  . aspirin EC  81 mg Oral Daily  . citalopram  20 mg Oral Daily  . enoxaparin (LOVENOX) injection  40 mg Subcutaneous Q24H  . feeding supplement (ENSURE ENLIVE)  237 mL Oral BID BM  . levothyroxine  50 mcg Oral Daily  . montelukast  10 mg Oral QHS   acetaminophen **OR** acetaminophen, ALPRAZolam, ondansetron **OR** ondansetron (ZOFRAN) IV  Assessment/ Plan:  85 y.o. female with a PMHx of hearing impairment, hypertension, dementia, humeral fracture, hypothyroidism, who was admitted to [facility] on 05/01/2018 for Hyponatremia. Sodium 109  Sodium at goal, 134.   Completed course of 3% saline infusion.    Currently on an SSRI  Will sign off, please call with questions.    LOS: 5 Christina Wise 3/25/202011:17 AM

## 2018-05-06 NOTE — Progress Notes (Signed)
Dr. Diamond aware placing new orders 

## 2018-05-06 NOTE — Progress Notes (Signed)
Called report to [facility]. Answered all questions. EMS called for transport.

## 2018-05-06 NOTE — TOC Progression Note (Signed)
Transition of Care [facility]) - Progression Note    Patient Details  Name: Christina Wise MRN: [id number] Date of Birth: 03-Sep-1921  [facility]) CM/SW Contact  Barrie Dunker, RN Phone Number: 05/06/2018, 10:59 AM  Clinical Narrative:     Received message from [employer] stating needed a signed copy of FL2, sending electronically to [employer] thru [employer]  Expected Discharge Plan: Skilled Nursing Facility Barriers to Discharge: SNF Pending bed offer, Continued Medical Work up  Expected Discharge Plan and Services Expected Discharge Plan: Skilled Nursing Facility       Living arrangements for the past 2 months: Single Family Home                           Social Determinants of Health (SDOH) Interventions    Readmission Risk Interventions No flowsheet data found.

## 2018-05-06 NOTE — Discharge Summary (Signed)
[facility] [place] [facility]   PATIENT NAME: Christina Wise    MR#:  [id number]  DATE OF BIRTH:  [birth year]  DATE OF ADMISSION:  05/01/2018   ADMITTING PHYSICIAN: Alford Highland, MD  DATE OF DISCHARGE:  05/06/18  PRIMARY CARE PHYSICIAN: Kandyce Rud, MD   ADMISSION DIAGNOSIS:   Hyponatremia [E87.1] Bacteriuria [R82.71] Altered mental status, unspecified altered mental status type [R41.82]  DISCHARGE DIAGNOSIS:   Active Problems:   Hyponatremia   Protein-calorie malnutrition, severe   SECONDARY DIAGNOSIS:   Past Medical History:  Diagnosis Date  . Hearing impairment   . Hypertension   . Mild dementia (HCC)   . Thyroid disease     HOSPITAL COURSE:   85 year old female with past medical history significant for hypertension, hypothyroidism who lives at home by herself brought in secondary to altered mental status and noted to have hyponatremia  1.  Acute hyponatremia on presentation-secondary to poor oral intake for a few weeks prior to admission. -Also patient was on hydrochlorothiazide at home which has been appropriately discontinued. -Sodium was 109 on admission.  Received both normal saline and 3% saline and sodium is currently at 134.  Has been off of fluids for more than 24 hours now -Appreciate nephrology consult  2.  Hypokalemia- replaced  3.  Hypertension-Norvasc  4.  Hypothyroidism-Synthroid  5.  Recent fall last month-patient had a fall in a long time in February 2019, came to the ER had acute proximal humeral comminuted fracture, she was placed in a sling and discharged home with home health.  No further falls reported. -Physical therapy, no sling, pain and movement improved  6. Dementia- has underlying dementia, oriented x2, CT head with chronic microvascular ischemic changes and atrophy  7.  Acute urinary retention-patient had Foley catheter for the initial days in the hospitalization.  Foley catheter has  been removed and she had needed in and out catheter once.  Continue to do a bladder scan every 8 hours and if greater than 300 cc urine in the bladder, do an in and out catheter. -If patient is requiring in and out catheters were greater than 3 times, insert a Foley catheter and do a voiding trial in 3 days.  Physical therapy recommended placement.  Insurance authorization received.  Patient will go to [facility] today -We will need palliative care consult at the facility due to dementia, poor oral take.  DISCHARGE CONDITIONS:   Guarded  CONSULTS OBTAINED:    Nephrology consult  DRUG ALLERGIES:   Allergies  Allergen Reactions  . Alendronate Other (See Comments)    GI upset  . Codeine Nausea And Vomiting  . Risedronate Other (See Comments)    GI Upset   DISCHARGE MEDICATIONS:   Allergies as of 05/06/2018      Reactions   Alendronate Other (See Comments)   GI upset   Codeine Nausea And Vomiting   Risedronate Other (See Comments)   GI Upset      Medication List    STOP taking these medications   aspirin EC 81 MG tablet   hydrochlorothiazide 25 MG tablet Commonly known as:  HYDRODIURIL   losartan 50 MG tablet Commonly known as:  COZAAR     TAKE these medications   ALPRAZolam 0.25 MG tablet Commonly known as:  XANAX Take 1 tablet (0.25 mg total) by mouth 2 (two) times daily as needed for anxiety. What changed:    when to take this  reasons to take this  amLODipine 5 MG tablet Commonly known as:  NORVASC Take 5 mg by mouth daily.   citalopram 20 MG tablet Commonly known as:  CELEXA Take 20 mg by mouth daily.   feeding supplement (ENSURE ENLIVE) Liqd Take 237 mLs by mouth 2 (two) times daily between meals.   Glucosamine-Chondroitin-MSM-D3 Tabs Take 1 tablet by mouth daily.   levothyroxine 50 MCG tablet Commonly known as:  SYNTHROID, LEVOTHROID Take 50 mcg by mouth daily.   montelukast 10 MG tablet Commonly known as:  SINGULAIR Take 10 mg by  mouth at bedtime.   traMADol 50 MG tablet Commonly known as:  Ultram Take 1 tablet (50 mg total) by mouth every 12 (twelve) hours as needed for up to 20 days for moderate pain or severe pain. What changed:  reasons to take this        DISCHARGE INSTRUCTIONS:   1. PCP f/u in 1-2 weeks 2. Check BMP in 5 days 3. Palliative care follow up as outpatient  DIET:   Regular diet  ACTIVITY:   Activity as tolerated  OXYGEN:   Home Oxygen: No.  Oxygen Delivery: room air  DISCHARGE LOCATION:   nursing home   If you experience worsening of your admission symptoms, develop shortness of breath, life threatening emergency, suicidal or homicidal thoughts you must seek medical attention immediately by calling 911 or calling your MD immediately  if symptoms less severe.  You Must read complete instructions/literature along with all the possible adverse reactions/side effects for all the Medicines you take and that have been prescribed to you. Take any new Medicines after you have completely understood and accpet all the possible adverse reactions/side effects.   Please note  You were cared for by a hospitalist during your hospital stay. If you have any questions about your discharge medications or the care you received while you were in the hospital after you are discharged, you can call the unit and asked to speak with the hospitalist on call if the hospitalist that took care of you is not available. Once you are discharged, your primary care physician will handle any further medical issues. Please note that NO REFILLS for any discharge medications will be authorized once you are discharged, as it is imperative that you return to your primary care physician (or establish a relationship with a primary care physician if you do not have one) for your aftercare needs so that they can reassess your need for medications and monitor your lab values.    On the day of Discharge:  VITAL SIGNS:    Blood pressure (!) 145/50, pulse 64, temperature (!) 97.4 F (36.3 C), temperature source Oral, resp. rate 10, height 5\' 4"  (1.626 m), weight 58.1 kg, SpO2 99 %.  PHYSICAL EXAMINATION:    GENERAL:  85 y.o.-year-old elderly patient lying in the bed with no acute distress.  Very hard of hearing EYES: Pupils equal, round, reactive to light and accommodation. No scleral icterus. Extraocular muscles intact.  HEENT: Head atraumatic, normocephalic. Oropharynx and nasopharynx clear.  Central head tremors are noted NECK:  Supple, no jugular venous distention. No thyroid enlargement, no tenderness.  LUNGS: Normal breath sounds bilaterally, no wheezing, rales,rhonchi or crepitation. No use of accessory muscles of respiration.  Dereased bibasilar breath sounds CARDIOVASCULAR: S1, S2 normal. No  rubs, or gallops.  2/6 systolic murmur is present ABDOMEN: Soft, nontender, nondistended. Bowel sounds present. No organomegaly or mass.  EXTREMITIES: No pedal edema, cyanosis, or clubbing.  NEUROLOGIC: Cranial nerves II through XII  are intact. Muscle strength 5/5 in all extremities.  Following simple commands.  Has global weakness. sensation intact. Gait not checked.  PSYCHIATRIC: The patient is alert and oriented x 2-3.  Very hard of hearing SKIN: No obvious rash, lesion, or ulcer.   DATA REVIEW:   CBC Recent Labs  Lab 05/02/18 0424  WBC 8.6  HGB 12.0  HCT 32.9*  PLT 190    Chemistries  Recent Labs  Lab 05/01/18 1611  05/03/18 0504  05/06/18 0311  NA 109*   < > 117*   < > 134*  K 3.4*   < >  --    < > 4.1  CL 73*   < >  --    < > 104  CO2 22   < >  --    < > 24  GLUCOSE 110*   < >  --    < > 114*  BUN 26*   < >  --    < > 24*  CREATININE 1.02*   < >  --    < > 0.70  CALCIUM 8.6*   < >  --    < > 8.8*  MG  --    < > 2.1  --   --   AST 38  --   --   --   --   ALT 18  --   --   --   --   ALKPHOS 130*  --   --   --   --   BILITOT 1.9*  --   --   --   --    < > = values in this interval  not displayed.     Microbiology Results  Results for orders placed or performed during the hospital encounter of 05/01/18  Urine culture     Status: None   Collection Time: 05/01/18  4:16 PM  Result Value Ref Range Status   Specimen Description   Final    URINE, RANDOM Performed at [facility], [address], [place], [place] [address]    Special Requests   Final    NONE Performed at [facility], [address] [place], [place] [address]    Culture   Final    NO GROWTH Performed at [facility], [address] [place], [place] [address]    Report Status 05/03/2018 FINAL  Final  MRSA PCR Screening     Status: None   Collection Time: 05/02/18  5:44 AM  Result Value Ref Range Status   MRSA by PCR NEGATIVE NEGATIVE Final    Comment:        The GeneXpert MRSA Assay (FDA approved for NASAL specimens only), is one component of a comprehensive MRSA colonization surveillance program. It is not intended to diagnose MRSA infection nor to guide or monitor treatment for MRSA infections. Performed at [facility], [address], [place], [place] [address]     RADIOLOGY:  No results found.   Management plans discussed with the patient, family and they are in agreement.  CODE STATUS:     Code Status Orders  (From admission, onward)         Start     Ordered   05/01/18 1852  Do not attempt resuscitation (DNR)  Continuous    Question Answer Comment  In the event of cardiac or respiratory ARREST Do not call a "code blue"   In the event of cardiac or respiratory  ARREST Do not perform Intubation, CPR, defibrillation or ACLS   In the event of cardiac or respiratory ARREST Use medication by any route, position, wound care, and other measures to relive pain and suffering. May use oxygen, suction and manual treatment of airway obstruction as needed for comfort.   Comments Nurse may pronounce      05/01/18 1852        Code Status  History    This patient has a current code status but no historical code status.    Advance Directive Documentation     Most Recent Value  Type of Advance Directive  Out of facility DNR (pink MOST or yellow form)  Pre-existing out of facility DNR order (yellow form or pink MOST form)  Physician notified to receive inpatient order  "MOST" Form in Place?  -      TOTAL TIME TAKING CARE OF THIS PATIENT: 38 minutes.    Enid Baas M.D on 05/06/2018 at 11:35 AM  Between 7am to 6pm - Pager - [phone]  After 6pm go to [email or web address] - Social research officer, government  [facility] [place] [facility]  Office  [phone]  CC: Primary care physician; Kandyce Rud, MD   Note: This dictation was prepared with Dragon dictation along with smaller phrase technology. Any transcriptional errors that result from this process are unintentional.

## 2018-05-06 NOTE — TOC Transition Note (Signed)
Transition of Care [facility]) - CM/SW Discharge Note   Patient Details  Name: Christina Wise MRN: [id number] Date of Birth: 23-Apr-1921  Transition of Care [facility]) CM/SW Contact:  Barrie Dunker, RN Phone Number: 05/06/2018, 12:16 PM   Clinical Narrative:    Patient to discharge to [facility] room 404 call report to [phone]  Notified Son Delton See of the DC,  Packet with DNR form on the chart, EMS to transport   Final next level of care: Skilled Nursing Facility Barriers to Discharge: Barriers Resolved   Patient Goals and CMS Choice Patient states their goals for this hospitalization and ongoing recovery are:: go to rehab      Discharge Placement                       Discharge Plan and Services                          Social Determinants of Health (SDOH) Interventions     Readmission Risk Interventions No flowsheet data found.

## 2019-03-18 ENCOUNTER — Ambulatory Visit (INDEPENDENT_AMBULATORY_CARE_PROVIDER_SITE_OTHER): Payer: Medicare Other | Admitting: Vascular Surgery

## 2019-03-18 ENCOUNTER — Encounter (INDEPENDENT_AMBULATORY_CARE_PROVIDER_SITE_OTHER): Payer: Self-pay | Admitting: Vascular Surgery

## 2019-03-18 ENCOUNTER — Other Ambulatory Visit: Payer: Self-pay

## 2019-03-18 DIAGNOSIS — I8312 Varicose veins of left lower extremity with inflammation: Secondary | ICD-10-CM | POA: Diagnosis not present

## 2019-03-18 DIAGNOSIS — I8311 Varicose veins of right lower extremity with inflammation: Secondary | ICD-10-CM

## 2019-03-18 DIAGNOSIS — I87009 Postthrombotic syndrome without complications of unspecified extremity: Secondary | ICD-10-CM

## 2019-03-18 NOTE — Progress Notes (Signed)
MRN : [id number]  Christina Wise is a 86 y.o. (11-Dec-1921) female who presents with chief complaint of  Chief Complaint  Patient presents with  . New Patient (Initial Visit)    ref Romeo Rabon for rle chronic thrombosis  .  History of Present Illness:    The patient is seen for evaluation of symptomatic right leg varicose veins. There is a remote history of DVT in the right leg.  The patient is a very poor historian but there is a concern she is experiencing burning and stinging which worsened steadily throughout the course of the day, particularly with standing. There is concern for aching and throbbing pain over the varicosities, particularly with prolonged dependent positions. The symptoms are significantly improved with elevation.    There is a history of DVT but not a history of PE or superficial thrombophlebitis.  There is no history of ulceration or hemorrhage. The patient denies a significant family history of varicose veins.  The patient has not worn graduated compression in the past. At the present time the patient has not been using over-the-counter analgesics. There is no history of prior surgical intervention or sclerotherapy.    Current Meds  Medication Sig  . acetaminophen (TYLENOL) 500 MG tablet Take 500 mg by mouth every 6 (six) hours as needed.  . ALPRAZolam (XANAX) 0.25 MG tablet Take 1 tablet (0.25 mg total) by mouth 2 (two) times daily as needed for anxiety.  Marland Kitchen amLODipine (NORVASC) 5 MG tablet Take 5 mg by mouth daily.  . citalopram (CELEXA) 20 MG tablet Take 20 mg by mouth daily.  . feeding supplement, ENSURE ENLIVE, (ENSURE ENLIVE) LIQD Take 237 mLs by mouth 2 (two) times daily between meals.  . Glucosamine-Chondroitin-MSM-D3 TABS Take 1 tablet by mouth daily.  Marland Kitchen levothyroxine (SYNTHROID, LEVOTHROID) 50 MCG tablet Take 50 mcg by mouth daily.  Alveda Reasons 10 MG TABS tablet Take 10 mg by mouth daily.    Past Medical History:  Diagnosis Date  . Hearing  impairment   . Hypertension   . Mild dementia ([place])   . Thyroid disease     Past Surgical History:  Procedure Laterality Date  . CHOLECYSTECTOMY      Social History Social History   Tobacco Use  . Smoking status: Never Smoker  . Smokeless tobacco: Never Used  Substance Use Topics  . Alcohol use: Never  . Drug use: Never    Family History Family History  Problem Relation Age of Onset  . Hypertension Mother   No family history of bleeding/clotting disorders, porphyria or autoimmune disease   Allergies  Allergen Reactions  . Alendronate Other (See Comments)    GI upset  . Codeine Nausea And Vomiting  . Risedronate Other (See Comments)    GI Upset     REVIEW OF SYSTEMS (Negative unless checked)  Constitutional: [] Weight loss  [] Fever  [] Chills Cardiac: [] Chest pain   [] Chest pressure   [] Palpitations   [] Shortness of breath when laying flat   [] Shortness of breath with exertion. Vascular:  [] Pain in legs with walking   [x] Pain in legs at rest  [] History of DVT   [] Phlebitis   [x] Swelling in legs   [x] Varicose veins   [] Non-healing ulcers Pulmonary:   [] Uses home oxygen   [] Productive cough   [] Hemoptysis   [] Wheeze  [] COPD   [] Asthma Neurologic:  [] Dizziness   [] Seizures   [] History of stroke   [] History of TIA  [] Aphasia   [] Vissual changes   [] Weakness  or numbness in arm   [] Weakness or numbness in leg Musculoskeletal:   [] Joint swelling   [] Joint pain   [] Low back pain Hematologic:  [] Easy bruising  [] Easy bleeding   [] Hypercoagulable state   [] Anemic Gastrointestinal:  [] Diarrhea   [] Vomiting  [] Gastroesophageal reflux/heartburn   [] Difficulty swallowing. Genitourinary:  [] Chronic kidney disease   [] Difficult urination  [] Frequent urination   [] Blood in urine Skin:  [] Rashes   [] Ulcers  Psychological:  [] History of anxiety   []  History of major depression.  Physical Examination  Vitals:   03/18/19 1404  BP: 132/74  Pulse: 79  Resp: 14   There is no height  or weight on file to calculate BMI. Gen: WD/WN, NAD Head: [place]/AT, No temporalis wasting.  Ear/Nose/Throat: Hearing grossly intact, nares w/o erythema or drainage, poor dentition Eyes: PER, EOMI, sclera nonicteric.  Neck: Supple, no masses.  No bruit or JVD.  Pulmonary:  Good air movement, clear to auscultation bilaterally, no use of accessory muscles.  Cardiac: RRR, normal S1, S2, no Murmurs. Vascular: Large varicosities present extensively greater than 10 mm right leg.  Moderate to severe venous stasis changes to the legs bilaterally.  2+ soft pitting edema Vessel Right Left  PT Palpable Palpable  DP Palpable Palpable  Gastrointestinal: soft, non-distended. No guarding/no peritoneal signs.  Musculoskeletal: M/S 5/5 throughout.  No deformity or atrophy.  Neurologic: CN 2-12 intact. Pain and light touch intact in extremities.  Symmetrical.  Speech is fluent. Motor exam as listed above. Psychiatric: Judgment intact, Mood & affect appropriate for pt's clinical situation. Dermatologic: No rashes or ulcers noted.  No changes consistent with cellulitis. Lymph : No Cervical lymphadenopathy, no lichenification or skin changes of chronic lymphedema.  CBC Lab Results  Component Value Date   WBC 8.6 05/02/2018   HGB 12.0 05/02/2018   HCT 32.9 (L) 05/02/2018   MCV 80.2 05/02/2018   PLT 190 05/02/2018    BMET    Component Value Date/Time   NA 134 (L) 05/06/2018 0311   K 4.1 05/06/2018 0311   CL 104 05/06/2018 0311   CO2 24 05/06/2018 0311   GLUCOSE 114 (H) 05/06/2018 0311   BUN 24 (H) 05/06/2018 0311   CREATININE 0.70 05/06/2018 0311   CALCIUM 8.8 (L) 05/06/2018 0311   GFRNONAA >60 05/06/2018 0311   GFRAA >60 05/06/2018 0311   CrCl cannot be calculated (Patient's most recent lab result is older than the maximum 21 days allowed.).  COAG No results found for: INR, PROTIME  Radiology No results found.   Assessment/Plan 1. Post-phlebitic syndrome Recommend:  The patient is  complaining of varicose veins.    I have had a long discussion with the patient regarding  varicose veins and why they cause symptoms.  Patient will begin wearing graduated compression stockings on a daily basis, beginning first thing in the morning and removing them in the evening. The patient is instructed specifically not to sleep in the stockings.    The patient  will also begin using over-the-counter analgesics such as Motrin 600 mg po TID to help control the symptoms as needed.    In addition, behavioral modification including elevation during the day will be initiated, utilizing a recliner was recommended.  The patient is also instructed to continue exercising such as walking 4-5 times per week.  At this time the patient wishes to continue conservative therapy and is not interested in more invasive treatments such as laser ablation and sclerotherapy.  The Patient will follow up PRN if  the symptoms worsen.  2. Varicose veins of both lower extremities with inflammation Recommend:  The patient is complaining of varicose veins.    I have had a long discussion with the patient regarding  varicose veins and why they cause symptoms.  Patient will begin wearing graduated compression stockings on a daily basis, beginning first thing in the morning and removing them in the evening. The patient is instructed specifically not to sleep in the stockings.    The patient  will also begin using over-the-counter analgesics such as Motrin 600 mg po TID to help control the symptoms as needed.    In addition, behavioral modification including elevation during the day will be initiated, utilizing a recliner was recommended.  The patient is also instructed to continue exercising such as walking 4-5 times per week.  At this time the patient wishes to continue conservative therapy and is not interested in more invasive treatments such as laser ablation and sclerotherapy.  The Patient will follow up PRN if the  symptoms worsen.   Levora Dredge, MD  03/18/2019 3:28 PM

## 2019-03-21 ENCOUNTER — Encounter (INDEPENDENT_AMBULATORY_CARE_PROVIDER_SITE_OTHER): Payer: Self-pay | Admitting: Vascular Surgery

## 2019-03-21 DIAGNOSIS — I87009 Postthrombotic syndrome without complications of unspecified extremity: Secondary | ICD-10-CM | POA: Insufficient documentation

## 2019-03-21 DIAGNOSIS — I8311 Varicose veins of right lower extremity with inflammation: Secondary | ICD-10-CM | POA: Insufficient documentation

## 2019-06-07 IMAGING — CT CT HEAD WITHOUT CONTRAST
3 of 7 series · 13 of 47 positions shown, 15 images · non-contrast
Comparison: None.

CLINICAL DATA: Altered mental status and weakness

EXAM:
CT HEAD WITHOUT CONTRAST
TECHNIQUE: Contiguous axial images were obtained from the base of the skull
through the vertex without intravenous contrast.

[Series 3: head wo · axial · 0.42mm/px · z∈[+67,+182]mm · 8 of 29 slices shown, 10 images]
[im 3/29  brain]
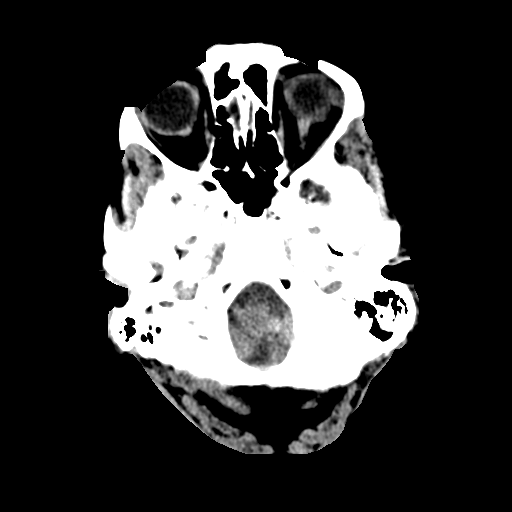
[im 3/29  bone]
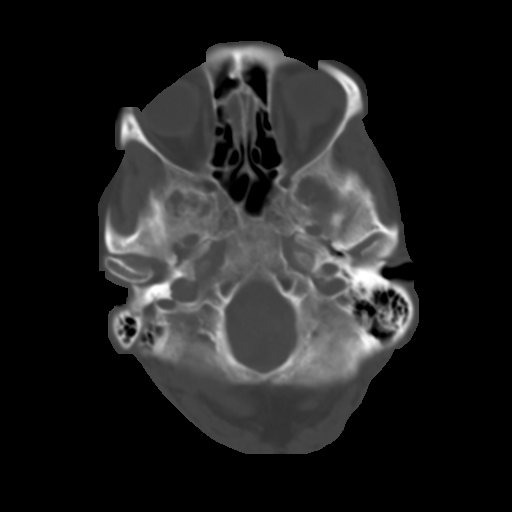
[im 6/29  brain]
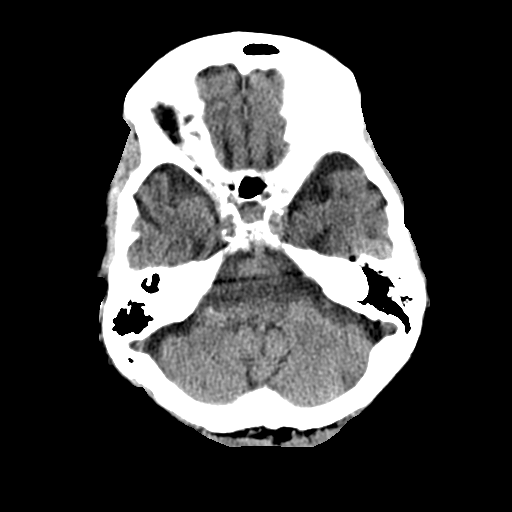
[im 11/29  brain]
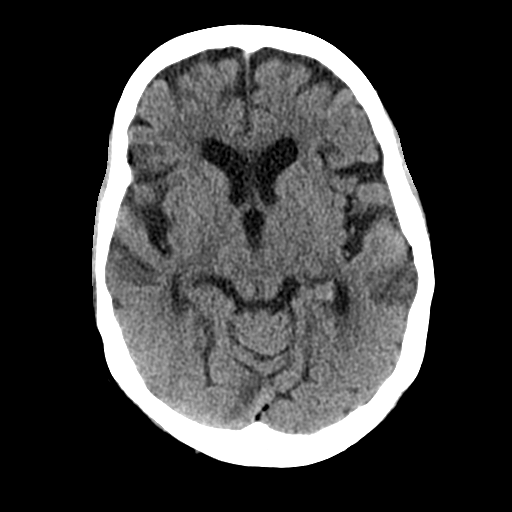
[im 13/29  brain]
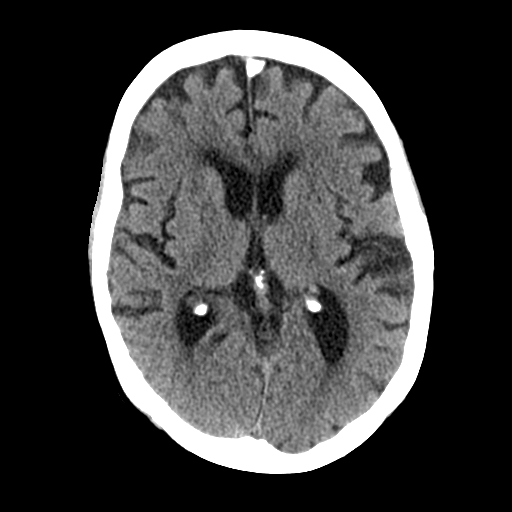
[im 16/29  brain]
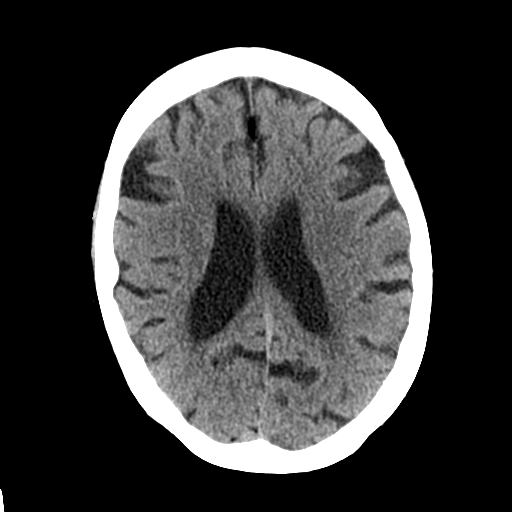
[im 16/29  bone]
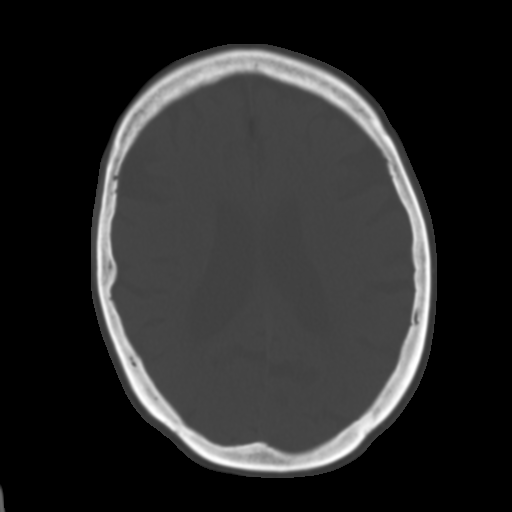
[im 18/29  brain]
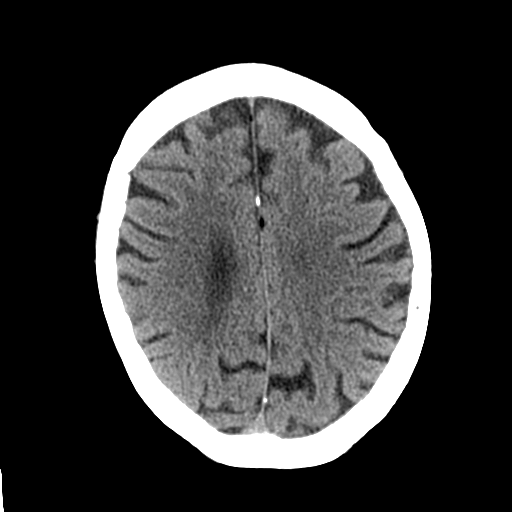
[im 23/29  brain]
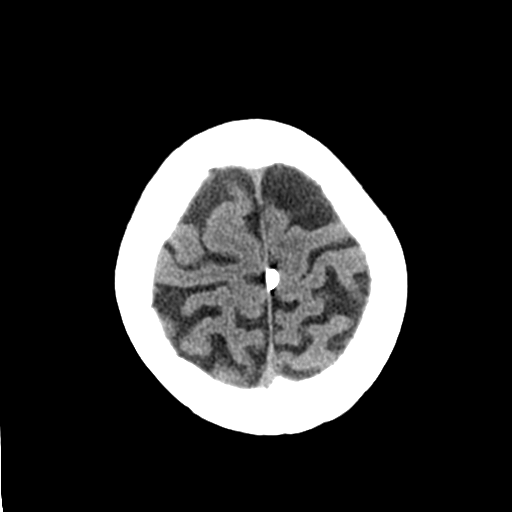
[im 26/29  brain]
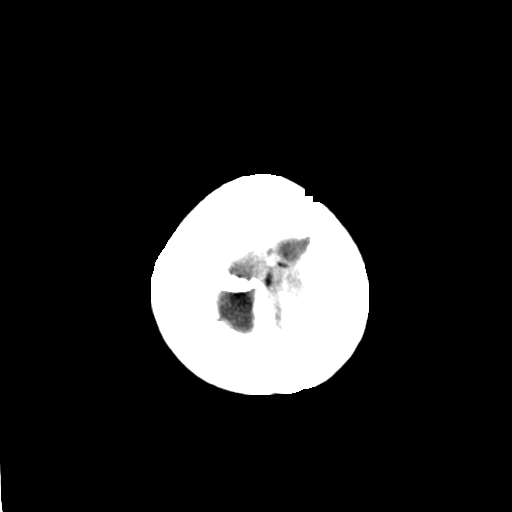

[Series 5: sagittal soft tissue · sagittal · 0.29mm/px · 2 of 51 slices shown]
[im 17/51  brain]
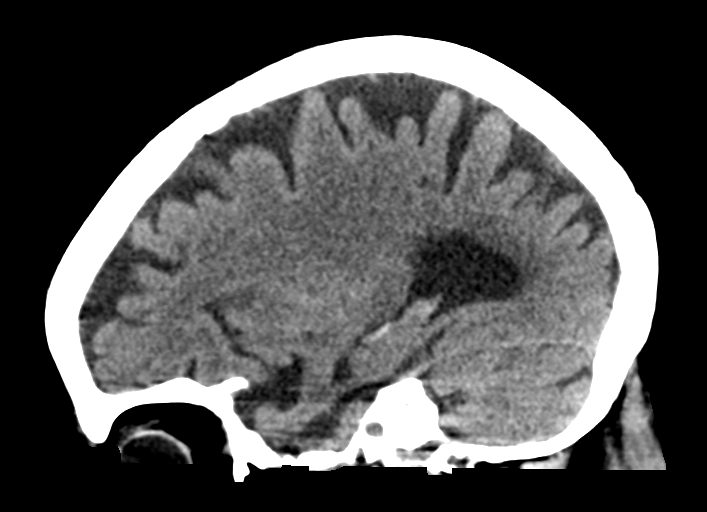
[im 34/51  brain]
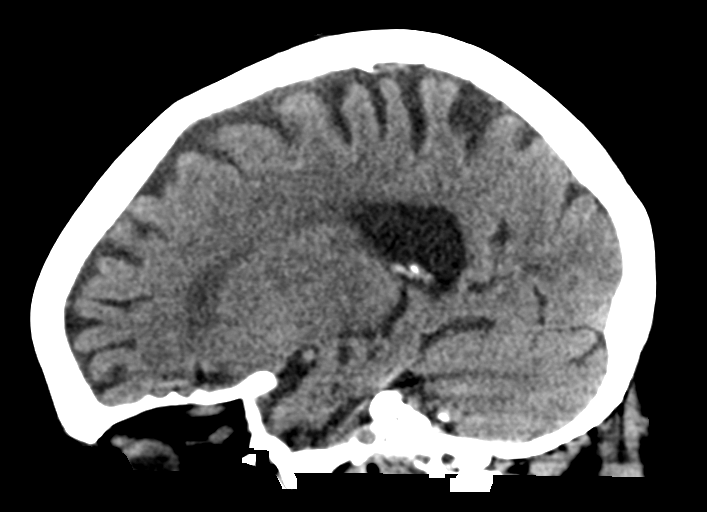

[Series 8: coronal soft tissue · coronal · 0.15mm/px · 3 of 62 slices shown]
[im 30/62  brain]
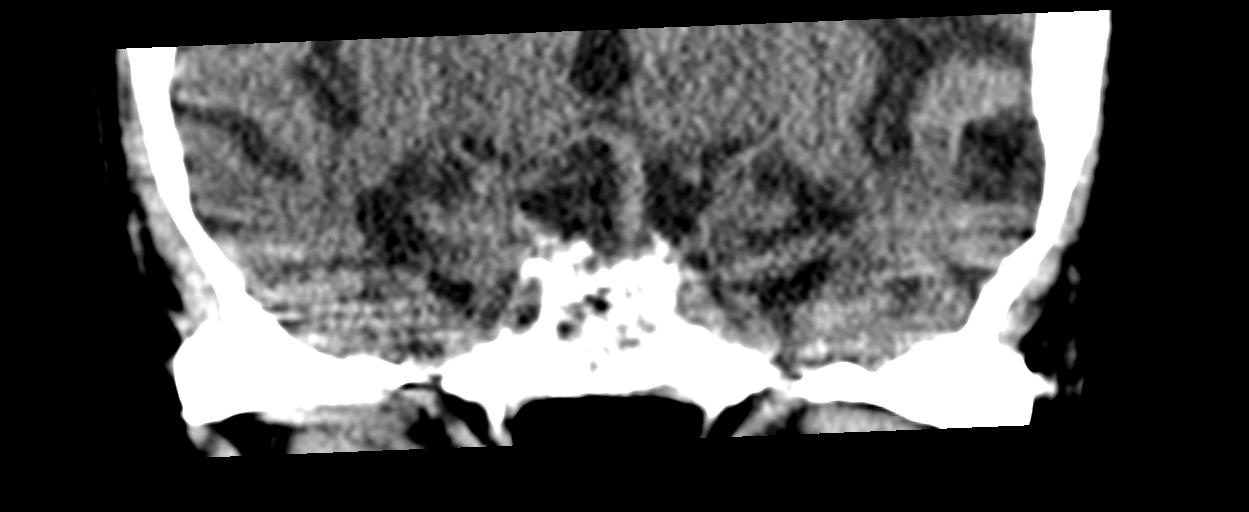
[im 38/62  brain]
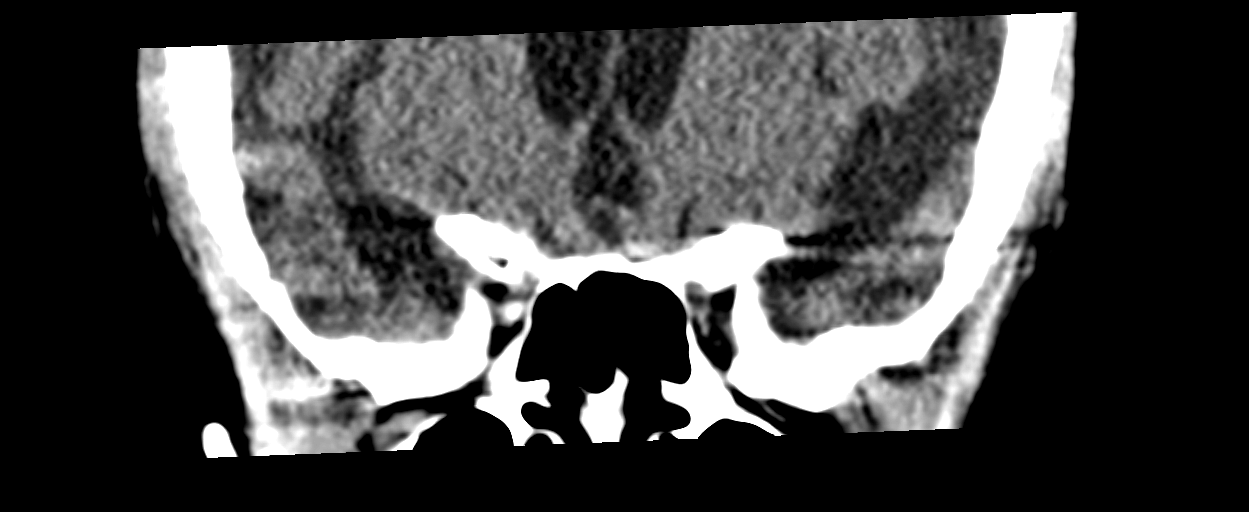
[im 46/62  brain]
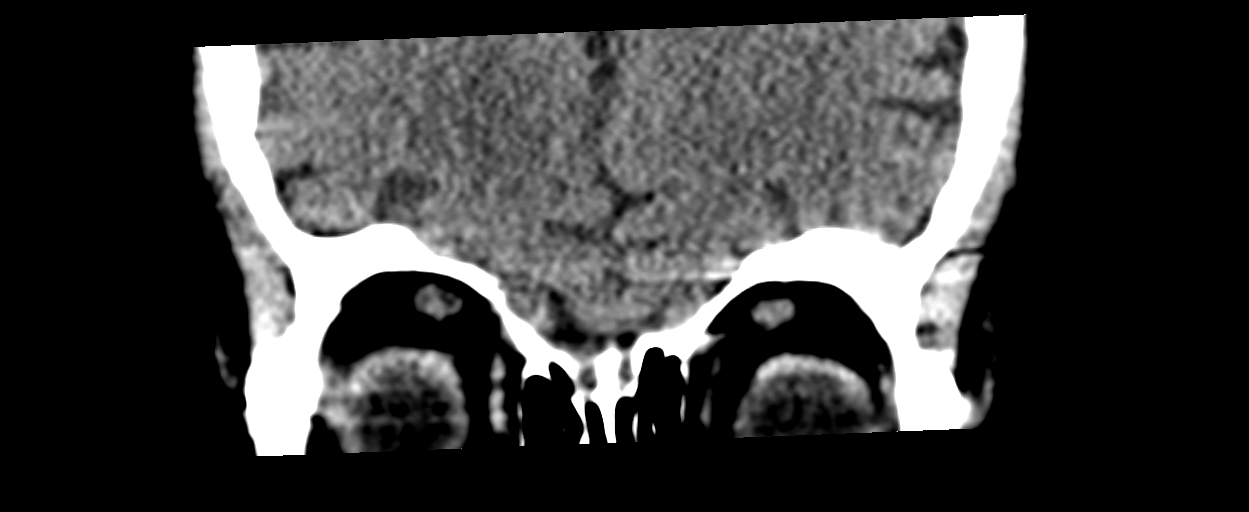

[13 of 47 positions shown; findings below may reference images not displayed]

FINDINGS: Brain: Generalized atrophic and chronic ischemic changes are noted.
No findings to suggest acute hemorrhage, acute infarction or
space-occupying mass lesion are noted.

Vascular: No hyperdense vessel or unexpected calcification.

Skull: Normal. Negative for fracture or focal lesion.

Sinuses/Orbits: No acute finding.

Other: None.
IMPRESSION: Chronic atrophic and ischemic changes without acute abnormality.

## 2019-10-06 ENCOUNTER — Emergency Department: Payer: Medicare Other

## 2019-10-06 ENCOUNTER — Other Ambulatory Visit: Payer: Self-pay

## 2019-10-06 ENCOUNTER — Inpatient Hospital Stay
Admission: EM | Admit: 2019-10-06 | Discharge: 2019-10-14 | DRG: 480 | Disposition: A | Discharge status: Skilled Nursing Facility | Payer: Medicare Other | Source: Skilled Nursing Facility | Attending: Internal Medicine | Admitting: Internal Medicine

## 2019-10-06 DIAGNOSIS — I129 Hypertensive chronic kidney disease with stage 1 through stage 4 chronic kidney disease, or unspecified chronic kidney disease: Secondary | ICD-10-CM | POA: Diagnosis present

## 2019-10-06 DIAGNOSIS — H9190 Unspecified hearing loss, unspecified ear: Secondary | ICD-10-CM | POA: Diagnosis present

## 2019-10-06 DIAGNOSIS — S72001A Fracture of unspecified part of neck of right femur, initial encounter for closed fracture: Secondary | ICD-10-CM | POA: Diagnosis not present

## 2019-10-06 DIAGNOSIS — Z20822 Contact with and (suspected) exposure to covid-19: Secondary | ICD-10-CM | POA: Diagnosis present

## 2019-10-06 DIAGNOSIS — E43 Unspecified severe protein-calorie malnutrition: Secondary | ICD-10-CM | POA: Diagnosis present

## 2019-10-06 DIAGNOSIS — Z7901 Long term (current) use of anticoagulants: Secondary | ICD-10-CM

## 2019-10-06 DIAGNOSIS — Z7189 Other specified counseling: Secondary | ICD-10-CM | POA: Diagnosis not present

## 2019-10-06 DIAGNOSIS — E079 Disorder of thyroid, unspecified: Secondary | ICD-10-CM | POA: Diagnosis present

## 2019-10-06 DIAGNOSIS — R739 Hyperglycemia, unspecified: Secondary | ICD-10-CM | POA: Diagnosis present

## 2019-10-06 DIAGNOSIS — S728X1A Other fracture of right femur, initial encounter for closed fracture: Secondary | ICD-10-CM

## 2019-10-06 DIAGNOSIS — F0280 Dementia in other diseases classified elsewhere without behavioral disturbance: Secondary | ICD-10-CM | POA: Diagnosis not present

## 2019-10-06 DIAGNOSIS — Z6825 Body mass index (BMI) 25.0-25.9, adult: Secondary | ICD-10-CM | POA: Diagnosis not present

## 2019-10-06 DIAGNOSIS — Z86718 Personal history of other venous thrombosis and embolism: Secondary | ICD-10-CM

## 2019-10-06 DIAGNOSIS — N179 Acute kidney failure, unspecified: Secondary | ICD-10-CM | POA: Diagnosis present

## 2019-10-06 DIAGNOSIS — E860 Dehydration: Secondary | ICD-10-CM | POA: Diagnosis present

## 2019-10-06 DIAGNOSIS — Z9049 Acquired absence of other specified parts of digestive tract: Secondary | ICD-10-CM

## 2019-10-06 DIAGNOSIS — R296 Repeated falls: Secondary | ICD-10-CM | POA: Diagnosis not present

## 2019-10-06 DIAGNOSIS — E559 Vitamin D deficiency, unspecified: Secondary | ICD-10-CM | POA: Diagnosis present

## 2019-10-06 DIAGNOSIS — F329 Major depressive disorder, single episode, unspecified: Secondary | ICD-10-CM | POA: Diagnosis not present

## 2019-10-06 DIAGNOSIS — J9601 Acute respiratory failure with hypoxia: Secondary | ICD-10-CM | POA: Diagnosis not present

## 2019-10-06 DIAGNOSIS — S72009A Fracture of unspecified part of neck of unspecified femur, initial encounter for closed fracture: Secondary | ICD-10-CM

## 2019-10-06 DIAGNOSIS — S72141A Displaced intertrochanteric fracture of right femur, initial encounter for closed fracture: Secondary | ICD-10-CM | POA: Diagnosis present

## 2019-10-06 DIAGNOSIS — N1832 Chronic kidney disease, stage 3b: Secondary | ICD-10-CM | POA: Diagnosis present

## 2019-10-06 DIAGNOSIS — F419 Anxiety disorder, unspecified: Secondary | ICD-10-CM | POA: Diagnosis present

## 2019-10-06 DIAGNOSIS — F0390 Unspecified dementia without behavioral disturbance: Secondary | ICD-10-CM | POA: Diagnosis present

## 2019-10-06 DIAGNOSIS — Z8249 Family history of ischemic heart disease and other diseases of the circulatory system: Secondary | ICD-10-CM | POA: Diagnosis not present

## 2019-10-06 DIAGNOSIS — Z79890 Hormone replacement therapy (postmenopausal): Secondary | ICD-10-CM | POA: Diagnosis not present

## 2019-10-06 DIAGNOSIS — G309 Alzheimer's disease, unspecified: Secondary | ICD-10-CM | POA: Diagnosis not present

## 2019-10-06 DIAGNOSIS — I10 Essential (primary) hypertension: Secondary | ICD-10-CM | POA: Diagnosis not present

## 2019-10-06 DIAGNOSIS — Z79899 Other long term (current) drug therapy: Secondary | ICD-10-CM

## 2019-10-06 DIAGNOSIS — W19XXXA Unspecified fall, initial encounter: Secondary | ICD-10-CM | POA: Diagnosis present

## 2019-10-06 DIAGNOSIS — E039 Hypothyroidism, unspecified: Secondary | ICD-10-CM | POA: Diagnosis present

## 2019-10-06 DIAGNOSIS — Z515 Encounter for palliative care: Secondary | ICD-10-CM | POA: Diagnosis not present

## 2019-10-06 DIAGNOSIS — S72001G Fracture of unspecified part of neck of right femur, subsequent encounter for closed fracture with delayed healing: Secondary | ICD-10-CM | POA: Diagnosis not present

## 2019-10-06 DIAGNOSIS — D62 Acute posthemorrhagic anemia: Secondary | ICD-10-CM | POA: Diagnosis not present

## 2019-10-06 DIAGNOSIS — R0600 Dyspnea, unspecified: Secondary | ICD-10-CM

## 2019-10-06 DIAGNOSIS — Y92129 Unspecified place in nursing home as the place of occurrence of the external cause: Secondary | ICD-10-CM | POA: Diagnosis not present

## 2019-10-06 DIAGNOSIS — Z66 Do not resuscitate: Secondary | ICD-10-CM | POA: Diagnosis present

## 2019-10-06 DIAGNOSIS — R339 Retention of urine, unspecified: Secondary | ICD-10-CM | POA: Diagnosis not present

## 2019-10-06 DIAGNOSIS — E8770 Fluid overload, unspecified: Secondary | ICD-10-CM | POA: Diagnosis not present

## 2019-10-06 DIAGNOSIS — R0902 Hypoxemia: Secondary | ICD-10-CM | POA: Diagnosis not present

## 2019-10-06 DIAGNOSIS — W19XXXD Unspecified fall, subsequent encounter: Secondary | ICD-10-CM | POA: Diagnosis not present

## 2019-10-06 LAB — BASIC METABOLIC PANEL
Anion gap: 12 (ref 5–15)
BUN: 25 mg/dL — ABNORMAL HIGH (ref 8–23)
CO2: 24 mmol/L (ref 22–32)
Calcium: 9.2 mg/dL (ref 8.9–10.3)
Chloride: 102 mmol/L (ref 98–111)
Creatinine, Ser: 1.21 mg/dL — ABNORMAL HIGH (ref 0.44–1.00)
GFR calc Af Amer: 44 mL/min — ABNORMAL LOW (ref >60)
GFR calc non Af Amer: 38 mL/min — ABNORMAL LOW (ref >60)
Glucose, Bld: 163 mg/dL — ABNORMAL HIGH (ref 70–99)
Potassium: 4.2 mmol/L (ref 3.5–5.1)
Sodium: 138 mmol/L (ref 135–145)

## 2019-10-06 LAB — SARS CORONAVIRUS 2 BY RT PCR (HOSPITAL ORDER, PERFORMED IN ~~LOC~~ HOSPITAL LAB): SARS Coronavirus 2: NEGATIVE

## 2019-10-06 LAB — CBC WITH DIFFERENTIAL/PLATELET
Abs Immature Granulocytes: 0.14 10*3/uL — ABNORMAL HIGH (ref 0.00–0.07)
Basophils Absolute: 0 10*3/uL (ref 0.0–0.1)
Basophils Relative: 0 %
Eosinophils Absolute: 0.1 10*3/uL (ref 0.0–0.5)
Eosinophils Relative: 1 %
HCT: 38.8 % (ref 36.0–46.0)
Hemoglobin: 13 g/dL (ref 12.0–15.0)
Immature Granulocytes: 2 %
Lymphocytes Relative: 10 %
Lymphs Abs: 1 10*3/uL (ref 0.7–4.0)
MCH: 28.6 pg (ref 26.0–34.0)
MCHC: 33.5 g/dL (ref 30.0–36.0)
MCV: 85.5 fL (ref 80.0–100.0)
Monocytes Absolute: 0.6 10*3/uL (ref 0.1–1.0)
Monocytes Relative: 7 %
Neutro Abs: 7.7 10*3/uL (ref 1.7–7.7)
Neutrophils Relative %: 80 %
Platelets: 171 10*3/uL (ref 150–400)
RBC: 4.54 MIL/uL (ref 3.87–5.11)
RDW: 14.4 % (ref 11.5–15.5)
WBC: 9.6 10*3/uL (ref 4.0–10.5)
nRBC: 0 % (ref 0.0–0.2)

## 2019-10-06 LAB — SAMPLE TO BLOOD BANK

## 2019-10-06 MED ORDER — MORPHINE SULFATE (PF) 2 MG/ML IV SOLN
0.5000 mg | INTRAVENOUS | Status: DC | PRN
  Administered 2019-10-06: 0.5 mg via INTRAVENOUS
  Filled 2019-10-06: qty 1

## 2019-10-06 MED ORDER — HYDROCODONE-ACETAMINOPHEN 5-325 MG PO TABS
1.0000 | ORAL_TABLET | Freq: Four times a day (QID) | ORAL | Status: DC | PRN
  Administered 2019-10-06 – 2019-10-10 (×5): 1 via ORAL
  Filled 2019-10-06 (×5): qty 1

## 2019-10-06 MED ORDER — ZOLPIDEM TARTRATE 5 MG PO TABS
5.0000 mg | ORAL_TABLET | Freq: Every evening | ORAL | Status: DC | PRN
  Administered 2019-10-07: 5 mg via ORAL
  Filled 2019-10-06: qty 1

## 2019-10-06 MED ORDER — DIVALPROEX SODIUM 125 MG PO DR TAB
125.0000 mg | DELAYED_RELEASE_TABLET | Freq: Two times a day (BID) | ORAL | Status: DC
  Administered 2019-10-07 – 2019-10-14 (×14): 125 mg via ORAL
  Filled 2019-10-06 (×17): qty 1

## 2019-10-06 MED ORDER — POLYETHYLENE GLYCOL 3350 17 G PO PACK: 17.0000 g | PACK | Freq: Every day | ORAL | Status: DC | PRN

## 2019-10-06 MED ORDER — SERTRALINE HCL 20 MG/ML PO CONC
25.0000 mg | Freq: Every day | ORAL | Status: DC
  Administered 2019-10-07 – 2019-10-14 (×7): 25 mg via ORAL
  Filled 2019-10-06 (×9): qty 1.25

## 2019-10-06 MED ORDER — LEVOTHYROXINE SODIUM 100 MCG PO TABS
100.0000 ug | ORAL_TABLET | Freq: Every day | ORAL | Status: DC
  Administered 2019-10-07 – 2019-10-14 (×7): 100 ug via ORAL
  Filled 2019-10-06 (×7): qty 1

## 2019-10-06 MED ORDER — FENTANYL CITRATE (PF) 100 MCG/2ML IJ SOLN
50.0000 ug | Freq: Once | INTRAMUSCULAR | Status: AC
  Administered 2019-10-06: 50 ug via INTRAVENOUS
  Filled 2019-10-06: qty 2

## 2019-10-06 MED ORDER — AMLODIPINE BESYLATE 5 MG PO TABS
5.0000 mg | ORAL_TABLET | Freq: Every day | ORAL | Status: DC
  Administered 2019-10-07: 5 mg via ORAL
  Filled 2019-10-06: qty 1

## 2019-10-06 MED ORDER — SODIUM CHLORIDE 0.9 % IV BOLUS
1000.0000 mL | Freq: Once | INTRAVENOUS | Status: AC
  Administered 2019-10-06: 1000 mL via INTRAVENOUS

## 2019-10-06 MED ORDER — ALPRAZOLAM 0.25 MG PO TABS
0.2500 mg | ORAL_TABLET | Freq: Two times a day (BID) | ORAL | Status: DC | PRN
  Administered 2019-10-09 – 2019-10-12 (×4): 0.25 mg via ORAL
  Filled 2019-10-06 (×5): qty 1

## 2019-10-06 MED ORDER — QUETIAPINE FUMARATE 25 MG PO TABS
25.0000 mg | ORAL_TABLET | Freq: Once | ORAL | Status: AC
  Administered 2019-10-06: 25 mg via ORAL
  Filled 2019-10-06: qty 1

## 2019-10-06 NOTE — ED Triage Notes (Signed)
Pt brought in by EMS from assisted living home; reportedly pt had a fall from standing landed on her right side (unsure if witnessed). Pt complaining of pain to her right leg two hours later to new shift nurse and EMS called. ALF nurse reported to this nurse via telephone that pt ambulates independently with walker and is independent in toileting and eating; she is very hard of hearing (won't wear her hearing aids).

## 2019-10-06 NOTE — ED Provider Notes (Signed)
[facility] Emergency Department Provider Note  ____________________________________________   I have reviewed the triage vital signs and the nursing notes.   HISTORY  Chief Complaint Fall   History limited by and level 5 caveat due to: poor historian.    HPI Christina Wise is a 86 y.o. female who presents to the emergency department today because of concern for right hip pain after a fall. Per report the patient fell roughly 3 hours ago. Unclear mechanism of the fall. The patient herself cannot give good history but does state that she is having pain in her right hip. She denies any pain in other part of her body.   Records reviewed. Per medical record review patient has a history of hypertension, mild dementia.  Past Medical History:  Diagnosis Date  . Hearing impairment   . Hypertension   . Mild dementia ([facility])   . Thyroid disease     Patient Active Problem List   Diagnosis Date Noted  . 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 Protein-calorie malnutrition, severe 05/03/2018  . Hyponatremia 05/01/2018    Past Surgical History:  Procedure Laterality Date  . CHOLECYSTECTOMY      Prior to Admission medications   Medication Sig Start Date End Date Taking? Authorizing Provider  acetaminophen (TYLENOL) 500 MG tablet Take 500 mg by mouth every 6 (six) hours as needed.    [provider]  ALPRAZolam Prudy Feeler) 0.25 MG tablet Take 1 tablet (0.25 mg total) by mouth 2 (two) times daily as needed for anxiety. 05/06/18   Enid Baas, MD  amLODipine (NORVASC) 5 MG tablet Take 5 mg by mouth daily. 03/02/18   [provider]  citalopram (CELEXA) 20 MG tablet Take 20 mg by mouth daily. 01/02/18   [provider]  feeding supplement, ENSURE ENLIVE, (ENSURE ENLIVE) LIQD Take 237 mLs by mouth 2 (two) times daily between meals. 05/06/18   Enid Baas, MD   Glucosamine-Chondroitin-MSM-D3 TABS Take 1 tablet by mouth daily.    [provider]  levothyroxine (SYNTHROID, LEVOTHROID) 50 MCG tablet Take 50 mcg by mouth daily. 03/25/18   [provider]  montelukast (SINGULAIR) 10 MG tablet Take 10 mg by mouth at bedtime. 11/24/17   [provider]  XARELTO 10 MG TABS tablet Take 10 mg by mouth daily. 03/03/19   [provider]    Allergies Alendronate, Codeine, and Risedronate  Family History  Problem Relation Age of Onset  . Hypertension Mother     Social History Social History   Tobacco Use  . Smoking status: Never Smoker  . Smokeless tobacco: Never Used  Substance Use Topics  . Alcohol use: Never  . Drug use: Never    Review of Systems Constitutional: No fever/chills Eyes: No visual changes. ENT: No sore throat. Cardiovascular: Denies chest pain. Respiratory: Denies shortness of breath. Gastrointestinal: No abdominal pain.  No nausea, no vomiting.  No diarrhea.   Genitourinary: Negative for dysuria. Musculoskeletal: Positive for right hip pain. Skin: Negative for rash. Neurological: Negative for headaches, focal weakness or numbness.  ____________________________________________   PHYSICAL EXAM:  VITAL SIGNS: ED Triage Vitals  Enc Vitals Group     BP 10/06/19 2013 (!) 184/77     Pulse Rate 10/06/19 2009 70     Resp 10/06/19 2009 18     Temp 10/06/19 2009 97.7 F (36.5 C)     Temp Source 10/06/19 2009 Oral  SpO2 10/06/19 2009 94 %     Weight 10/06/19 2015 150 lb (68 kg)     Height --      Head Circumference --      Peak Flow --      Pain Score 10/06/19 2014 7   Constitutional: Awake and alert.  Eyes: Conjunctivae are normal.  ENT      Head: Normocephalic and atraumatic.      Nose: No congestion/rhinnorhea.      Mouth/Throat: Mucous membranes are moist.      Neck: No stridor. Hematological/Lymphatic/Immunilogical: No cervical lymphadenopathy. Cardiovascular: Normal rate,  regular rhythm.  No murmurs, rubs, or gallops. Respiratory: Normal respiratory effort without tachypnea nor retractions. Breath sounds are clear and equal bilaterally. No wheezes/rales/rhonchi. Gastrointestinal: Soft and non tender. No rebound. No guarding.  Genitourinary: Deferred Musculoskeletal: Right leg externally rotated and shortened. N/V intact distally. Neurologic:  Dementia. Not completley oriented. Moving all extremities. Skin:  Skin is warm, dry and intact. No rash noted. Psychiatric: Mood and affect are normal. Speech and behavior are normal. Patient exhibits appropriate insight and judgment.  ____________________________________________    LABS (pertinent positives/negatives)  CBC wbc 9.6, hgb 13.0, plt 171 BMP na 138, k 4.2, glu 163, cr 1.21   ____________________________________________    RADIOLOGY  Right hip Intertrochanteric fracture  CXR No active disease  CT head/cervical spine No acute traumatic finding  ____________________________________________   PROCEDURES  Procedures  ____________________________________________   INITIAL IMPRESSION / ASSESSMENT AND PLAN / ED COURSE  Pertinent labs & imaging results that were available during my care of the patient were reviewed by me and considered in my medical decision making (see chart for details).   Patient presented to the emergency department today because of concerns for right hip pain after a fall.  On exam patient's right leg is externally rotated and shortened. X-ray confirmed femur fracture. Discussed with Dr. Odis Luster with orthopedic surgery. Will plan on admission.   ____________________________________________   FINAL CLINICAL IMPRESSION(S) / ED DIAGNOSES  Final diagnoses:  Other closed fracture of right femur, unspecified portion of femur, initial encounter [facility])     Note: This dictation was prepared with Dragon dictation. Any transcriptional errors that result from this process are  unintentional     Phineas Semen, MD 10/06/19 2135

## 2019-10-06 NOTE — H&P (Signed)
[facility] History and Physical  Christina Wise KCL:[id number] DOB: [birth year] DOA: 10/06/2019  Referring physician: Dr. Derrill Kay PCP: Patient, No Pcp Per   Chief Complaint: fall  HPI: Christina Wise is a 86 y.o. female with history of dementia, CKD, hypothyroidism, hypertension, who presents after a fall.  Patient presents from nursing home after unwitnessed fall.  Pleasantly demented and unable to provide any significant history.  Stating that her right hip is in significant pain.  Collateral provided to RN by nursing home staff states that patient at baseline is almost completely independent, able to move around with a walker and do most ADLs by herself.  On arrival to the ED vital signs notable only for hypertension.  Lab work-up notable for BMP with creatinine of 1.2 above her baseline of 0.7, unremarkable CBC.  CT head and spine was performed without acute abnormalities.  X-ray obtained.  Plain film of right hip obtained which showed right femoral intertrochanteric fracture and an age-indeterminate right inferior pubic ramus fracture.  Patient on Xarelto, per review of nursing home paperwork indication was superficial DVT.  Orthopedics consulted in the emergency department, due to anticoagulation status they will follow patient but repair will need to be delayed.  She was admitted for further management of her right hip fracture.  Review of Systems:  Pertinent positives and negative per HPI, all others reviewed and negative   Past Medical History:  Diagnosis Date  . Hearing impairment   . Hypertension   . Mild dementia (HCC)   . Thyroid disease    Past Surgical History:  Procedure Laterality Date  . CHOLECYSTECTOMY     Social History:  reports that she has never smoked. She has never used smokeless tobacco. She reports that she does not drink alcohol and does not use drugs.  Allergies  Allergen Reactions  . Alendronate Other (See Comments)    GI upset   . Codeine Nausea And Vomiting  . Risedronate Other (See Comments)    GI Upset    Family History  Problem Relation Age of Onset  . Hypertension Mother      Prior to Admission medications   Medication Sig Start Date End Date Taking? Authorizing Provider  acetaminophen (TYLENOL) 500 MG tablet Take 500 mg by mouth every 6 (six) hours as needed.    [provider]  ALPRAZolam Prudy Feeler) 0.25 MG tablet Take 1 tablet (0.25 mg total) by mouth 2 (two) times daily as needed for anxiety. 05/06/18   Enid Baas, MD  amLODipine (NORVASC) 5 MG tablet Take 5 mg by mouth daily. 03/02/18   [provider]  citalopram (CELEXA) 20 MG tablet Take 20 mg by mouth daily. 01/02/18   [provider]  feeding supplement, ENSURE ENLIVE, (ENSURE ENLIVE) LIQD Take 237 mLs by mouth 2 (two) times daily between meals. 05/06/18   Enid Baas, MD  Glucosamine-Chondroitin-MSM-D3 TABS Take 1 tablet by mouth daily.    [provider]  levothyroxine (SYNTHROID, LEVOTHROID) 50 MCG tablet Take 50 mcg by mouth daily. 03/25/18   [provider]  montelukast (SINGULAIR) 10 MG tablet Take 10 mg by mouth at bedtime. 11/24/17   [provider]  XARELTO 10 MG TABS tablet Take 10 mg by mouth daily. 03/03/19   [provider]   Physical Exam: Vitals:   10/06/19 2009 10/06/19 2013 10/06/19 2015 10/06/19 2200  BP:  (!) 184/77  (!) 180/76  Pulse: 70   85  Resp: 18   14  Temp: 97.7  F (36.5 C)     TempSrc: Oral     SpO2: 94%   97%  Weight:   68 kg     Wt Readings from Last 3 Encounters:  10/06/19 68 kg  05/01/18 58.1 kg  03/30/18 58.1 kg    General:  Appears uncomfortable Eyes: PERRL, normal lids, irises & conjunctiva ENT: grossly normal hearing, lips & tongue Neck: no masses Cardiovascular: RRR, no m/r/g. No LE edema. LE warm and well perfused. Respiratory: CTA bilaterally, no w/r/r. Normal respiratory effort. Abdomen: soft, ntnd Skin: no rash or  induration seen on limited exam Musculoskeletal: grossly normal tone BUE/BLE Psychiatric: Stating constantly that her right hip is in pain, unable to engage in routine questioning Neurologic: grossly non-focal. Sensation intact to light touch in b/l LE          Labs on Admission:  Basic Metabolic Panel: Recent Labs  Lab 10/06/19 2012  NA 138  K 4.2  CL 102  CO2 24  GLUCOSE 163*  BUN 25*  CREATININE 1.21*  CALCIUM 9.2   Liver Function Tests: No results for input(s): AST, ALT, ALKPHOS, BILITOT, PROT, ALBUMIN in the last 168 hours. No results for input(s): LIPASE, AMYLASE in the last 168 hours. No results for input(s): AMMONIA in the last 168 hours. CBC: Recent Labs  Lab 10/06/19 2012  WBC 9.6  NEUTROABS 7.7  HGB 13.0  HCT 38.8  MCV 85.5  PLT 171   Cardiac Enzymes: No results for input(s): CKTOTAL, CKMB, CKMBINDEX, TROPONINI in the last 168 hours.  BNP (last 3 results) No results for input(s): BNP in the last 8760 hours.  ProBNP (last 3 results) No results for input(s): PROBNP in the last 8760 hours.  CBG: No results for input(s): GLUCAP in the last 168 hours.  Radiological Exams on Admission: DG Chest 1 View  Result Date: 10/06/2019 CLINICAL DATA:  Larey Seat.  Shortened right leg. EXAM: CHEST  1 VIEW COMPARISON:  05/01/2018 FINDINGS: Heart size is normal. Aortic atherosclerosis. Chronic interstitial lung markings. No evidence of active infiltrate, collapse, edema or effusion. No acute chest region bone finding. IMPRESSION: No active disease. Chronic interstitial lung markings. Electronically Signed   By: Paulina Fusi M.D.   On: 10/06/2019 21:11   CT Head Wo Contrast  Result Date: 10/06/2019 CLINICAL DATA:  Dementia with fall EXAM: CT HEAD WITHOUT CONTRAST CT CERVICAL SPINE WITHOUT CONTRAST TECHNIQUE: Multidetector CT imaging of the head and cervical spine was performed following the standard protocol without intravenous contrast. Multiplanar CT image reconstructions of  the cervical spine were also generated. COMPARISON:  CT brain 05/01/2018 FINDINGS: CT HEAD FINDINGS Brain: No acute territorial infarction, hemorrhage, or intracranial mass. Moderate atrophy. Mild hypodensity in the white matter consistent with chronic small vessel ischemic change. Stable ventricle size. Vascular: No hyperdense vessels.  Carotid vascular calcification Skull: Normal. Negative for fracture or focal lesion. Sinuses/Orbits: No acute finding. Other: None CT CERVICAL SPINE FINDINGS Alignment: Reversal of cervical lordosis. Trace anterolisthesis C3 on C4. Facet alignment is maintained. Skull base and vertebrae: No acute fracture. No primary bone lesion or focal pathologic process. Soft tissues and spinal canal: No prevertebral fluid or swelling. No visible canal hematoma. Disc levels: Diffuse degenerative changes with advanced disease at C5-C6 and C6-C7. Facet degenerative changes at multiple levels with bilateral foraminal narrowing Upper chest: Negative. Other: None IMPRESSION: 1. No CT evidence for acute intracranial abnormality. Atrophy and mild chronic small vessel ischemic changes of the white matter. 2. Reversal of cervical lordosis with degenerative  changes. No acute osseous abnormality. Electronically Signed   By: Jasmine Pang M.D.   On: 10/06/2019 21:03   CT Cervical Spine Wo Contrast  Result Date: 10/06/2019 CLINICAL DATA:  Dementia with fall EXAM: CT HEAD WITHOUT CONTRAST CT CERVICAL SPINE WITHOUT CONTRAST TECHNIQUE: Multidetector CT imaging of the head and cervical spine was performed following the standard protocol without intravenous contrast. Multiplanar CT image reconstructions of the cervical spine were also generated. COMPARISON:  CT brain 05/01/2018 FINDINGS: CT HEAD FINDINGS Brain: No acute territorial infarction, hemorrhage, or intracranial mass. Moderate atrophy. Mild hypodensity in the white matter consistent with chronic small vessel ischemic change. Stable ventricle size.  Vascular: No hyperdense vessels.  Carotid vascular calcification Skull: Normal. Negative for fracture or focal lesion. Sinuses/Orbits: No acute finding. Other: None CT CERVICAL SPINE FINDINGS Alignment: Reversal of cervical lordosis. Trace anterolisthesis C3 on C4. Facet alignment is maintained. Skull base and vertebrae: No acute fracture. No primary bone lesion or focal pathologic process. Soft tissues and spinal canal: No prevertebral fluid or swelling. No visible canal hematoma. Disc levels: Diffuse degenerative changes with advanced disease at C5-C6 and C6-C7. Facet degenerative changes at multiple levels with bilateral foraminal narrowing Upper chest: Negative. Other: None IMPRESSION: 1. No CT evidence for acute intracranial abnormality. Atrophy and mild chronic small vessel ischemic changes of the white matter. 2. Reversal of cervical lordosis with degenerative changes. No acute osseous abnormality. Electronically Signed   By: Jasmine Pang M.D.   On: 10/06/2019 21:03   DG Hip Unilat W or Wo Pelvis 2-3 Views Right  Result Date: 10/06/2019 CLINICAL DATA:  Fall, right leg pain EXAM: DG HIP (WITH OR WITHOUT PELVIS) 2-3V RIGHT COMPARISON:  None. FINDINGS: There is a right femoral intertrochanteric fracture. No significant angulation. No subluxation or dislocation. Age-indeterminate right inferior pubic ramus fracture noted. IMPRESSION: Right femoral intertrochanteric fracture. Age indeterminate right inferior pubic ramus fracture. Electronically Signed   By: Charlett Nose M.D.   On: 10/06/2019 21:12    EKG: Not obtained  Assessment/Plan Active Problems:   Hip fracture (HCC)   #Acute R hip fracture Patient presenting after unwitnessed fall with right hip fracture.  Unfortunately patient is fully anticoagulated so repair will have to be delayed. -Pain meds as needed -Follow-up orthopedic recommendations on timing of surgery -Neurovascular checks  #AKI Baseline creatinine 0.7 as of few months ago,  dehydration may have played a role in her fall as well.  Will fluid resuscitate and reassess. -Trend BMP  #Chronic medical problems Hypertension: Continue amlodipine Anxiety/depression: Continue Xanax, Celexa Dementia: Continue Depakote Hypothyroidism: Continue Synthroid   Code Status: DNR, paperwork present at bedside DVT Prophylaxis: SCDs Family Communication: none Disposition Plan: Inpatient  Time spent: 50 min  Venora Maples MD/MPH [facility]

## 2019-10-07 DIAGNOSIS — R739 Hyperglycemia, unspecified: Secondary | ICD-10-CM

## 2019-10-07 DIAGNOSIS — F329 Major depressive disorder, single episode, unspecified: Secondary | ICD-10-CM

## 2019-10-07 DIAGNOSIS — F419 Anxiety disorder, unspecified: Secondary | ICD-10-CM

## 2019-10-07 DIAGNOSIS — S72141A Displaced intertrochanteric fracture of right femur, initial encounter for closed fracture: Principal | ICD-10-CM

## 2019-10-07 DIAGNOSIS — F0280 Dementia in other diseases classified elsewhere without behavioral disturbance: Secondary | ICD-10-CM

## 2019-10-07 DIAGNOSIS — R296 Repeated falls: Secondary | ICD-10-CM

## 2019-10-07 DIAGNOSIS — G309 Alzheimer's disease, unspecified: Secondary | ICD-10-CM

## 2019-10-07 DIAGNOSIS — I10 Essential (primary) hypertension: Secondary | ICD-10-CM

## 2019-10-07 LAB — BASIC METABOLIC PANEL
Anion gap: 10 (ref 5–15)
BUN: 21 mg/dL (ref 8–23)
CO2: 26 mmol/L (ref 22–32)
Calcium: 8.7 mg/dL — ABNORMAL LOW (ref 8.9–10.3)
Chloride: 103 mmol/L (ref 98–111)
Creatinine, Ser: 0.99 mg/dL (ref 0.44–1.00)
GFR calc Af Amer: 56 mL/min — ABNORMAL LOW (ref >60)
GFR calc non Af Amer: 48 mL/min — ABNORMAL LOW (ref >60)
Glucose, Bld: 150 mg/dL — ABNORMAL HIGH (ref 70–99)
Potassium: 4.5 mmol/L (ref 3.5–5.1)
Sodium: 139 mmol/L (ref 135–145)

## 2019-10-07 LAB — CBC
HCT: 33.4 % — ABNORMAL LOW (ref 36.0–46.0)
Hemoglobin: 11.3 g/dL — ABNORMAL LOW (ref 12.0–15.0)
MCH: 28.7 pg (ref 26.0–34.0)
MCHC: 33.8 g/dL (ref 30.0–36.0)
MCV: 84.8 fL (ref 80.0–100.0)
Platelets: 174 10*3/uL (ref 150–400)
RBC: 3.94 MIL/uL (ref 3.87–5.11)
RDW: 14.2 % (ref 11.5–15.5)
WBC: 10.4 10*3/uL (ref 4.0–10.5)
nRBC: 0 % (ref 0.0–0.2)

## 2019-10-07 LAB — MRSA PCR SCREENING: MRSA by PCR: NEGATIVE

## 2019-10-07 LAB — CK: Total CK: 159 U/L (ref 38–234)

## 2019-10-07 MED ORDER — MORPHINE SULFATE (PF) 2 MG/ML IV SOLN
1.0000 mg | INTRAVENOUS | Status: DC | PRN
  Administered 2019-10-08 (×2): 1 mg via INTRAVENOUS
  Filled 2019-10-07 (×2): qty 1

## 2019-10-07 MED ORDER — LACTATED RINGERS IV SOLN: INTRAVENOUS | Status: AC

## 2019-10-07 MED ORDER — ENSURE ENLIVE PO LIQD
237.0000 mL | Freq: Two times a day (BID) | ORAL | Status: DC
  Administered 2019-10-07 – 2019-10-11 (×5): 237 mL via ORAL

## 2019-10-07 MED ORDER — AMLODIPINE BESYLATE 10 MG PO TABS
10.0000 mg | ORAL_TABLET | Freq: Every day | ORAL | Status: DC
  Administered 2019-10-08 – 2019-10-14 (×6): 10 mg via ORAL
  Filled 2019-10-07 (×6): qty 1

## 2019-10-07 NOTE — Progress Notes (Signed)
I will be taking over this patient's care from Dr. Odis Luster as I am on-call this weekend.  Patient came in overnight with a right intertrochanteric hip fractureafter an unwitnessed fall at [facility], which will require intramedullary fixation.  The patient is on Xarelto for a history of a superficial DVT.  The patient will require at least 72 hours off of Xarelto in order for the patient to undergo placement of a spinal anesthetic for surgery.  Therefore surgery is tentatively scheduled for Sunday morning.    I spoke with the patient's son, Delton See, by phone today to explain to him the nature of the patient's fracture.  I have recommended intramedullary fixation for her fracture.  I have discussed the details of the operation as well as the postoperative course.  We also discussed the risk benefits of surgery.  He understands the risks include but are not limited to infection, bleeding requiring blood transfusion, nerve or blood vessel injury, leg length discrepancy, change in lower extremity rotation, persistent right hip pain, failure of the hardware and the need for further surgery.  He also understands the medical risks include but are not limited to DVT and pulmonary embolism, microinfarction, stroke, pneumonia, respiratory failure and death.  Patient's understood these risks and agreed with the surgical plan.

## 2019-10-07 NOTE — Consult Note (Signed)
ORTHOPAEDIC CONSULTATION  REQUESTING PHYSICIAN: Almon Hercules, MD  Chief Complaint: right hip pain  HPI: Christina Wise is a 86 y.o. female who complains of right hip pain after fall. Please see H&P and ED notes for details. Denies any numbness, tingling or constitutional symptoms.  Past Medical History:  Diagnosis Date  . Hearing impairment   . Hypertension   . Mild dementia (HCC)   . Thyroid disease    Past Surgical History:  Procedure Laterality Date  . CHOLECYSTECTOMY     Social History   Socioeconomic History  . Marital status: Widowed    Spouse name: Not on file  . Number of children: Not on file  . Years of education: Not on file  . Highest education level: Not on file  Occupational History  . Not on file  Tobacco Use  . Smoking status: Never Smoker  . Smokeless tobacco: Never Used  Substance and Sexual Activity  . Alcohol use: Never  . Drug use: Never  . Sexual activity: Not on file  Other Topics Concern  . Not on file  Social History Narrative  . Not on file   Social Determinants of Health   Financial Resource Strain:   . Difficulty of Paying Living Expenses: Not on file  Food Insecurity:   . Worried About Programme researcher, broadcasting/film/video in the Last Year: Not on file  . Ran Out of Food in the Last Year: Not on file  Transportation Needs:   . Lack of Transportation (Medical): Not on file  . Lack of Transportation (Non-Medical): Not on file  Physical Activity:   . Days of Exercise per Week: Not on file  . Minutes of Exercise per Session: Not on file  Stress:   . Feeling of Stress : Not on file  Social Connections:   . Frequency of Communication with Friends and Family: Not on file  . Frequency of Social Gatherings with Friends and Family: Not on file  . Attends Religious Services: Not on file  . Active Member of Clubs or Organizations: Not on file  . Attends Banker Meetings: Not on file  . Marital Status: Not on file   Family History   Problem Relation Age of Onset  . Hypertension Mother    Allergies  Allergen Reactions  . Alendronate Other (See Comments)    GI upset  . Codeine Nausea And Vomiting  . Risedronate Other (See Comments)    GI Upset   Prior to Admission medications   Medication Sig Start Date End Date Taking? Authorizing Provider  acetaminophen (TYLENOL) 500 MG tablet Take 500 mg by mouth every 6 (six) hours as needed.    [provider]  ALPRAZolam Prudy Feeler) 0.25 MG tablet Take 1 tablet (0.25 mg total) by mouth 2 (two) times daily as needed for anxiety. 05/06/18   Enid Baas, MD  amLODipine (NORVASC) 5 MG tablet Take 5 mg by mouth daily. 03/02/18   [provider]  citalopram (CELEXA) 20 MG tablet Take 20 mg by mouth daily. 01/02/18   [provider]  feeding supplement, ENSURE ENLIVE, (ENSURE ENLIVE) LIQD Take 237 mLs by mouth 2 (two) times daily between meals. 05/06/18   Enid Baas, MD  Glucosamine-Chondroitin-MSM-D3 TABS Take 1 tablet by mouth daily.    [provider]  levothyroxine (SYNTHROID, LEVOTHROID) 50 MCG tablet Take 50 mcg by mouth daily. 03/25/18   [provider]  montelukast (SINGULAIR) 10 MG tablet Take 10 mg by mouth at bedtime.  11/24/17   [provider]  XARELTO 10 MG TABS tablet Take 10 mg by mouth daily. 03/03/19   [provider]   DG Chest 1 View  Result Date: 10/06/2019 CLINICAL DATA:  Larey Seat.  Shortened right leg. EXAM: CHEST  1 VIEW COMPARISON:  05/01/2018 FINDINGS: Heart size is normal. Aortic atherosclerosis. Chronic interstitial lung markings. No evidence of active infiltrate, collapse, edema or effusion. No acute chest region bone finding. IMPRESSION: No active disease. Chronic interstitial lung markings. Electronically Signed   By: Paulina Fusi M.D.   On: 10/06/2019 21:11   CT Head Wo Contrast  Result Date: 10/06/2019 CLINICAL DATA:  Dementia with fall EXAM: CT HEAD WITHOUT CONTRAST CT CERVICAL SPINE  WITHOUT CONTRAST TECHNIQUE: Multidetector CT imaging of the head and cervical spine was performed following the standard protocol without intravenous contrast. Multiplanar CT image reconstructions of the cervical spine were also generated. COMPARISON:  CT brain 05/01/2018 FINDINGS: CT HEAD FINDINGS Brain: No acute territorial infarction, hemorrhage, or intracranial mass. Moderate atrophy. Mild hypodensity in the white matter consistent with chronic small vessel ischemic change. Stable ventricle size. Vascular: No hyperdense vessels.  Carotid vascular calcification Skull: Normal. Negative for fracture or focal lesion. Sinuses/Orbits: No acute finding. Other: None CT CERVICAL SPINE FINDINGS Alignment: Reversal of cervical lordosis. Trace anterolisthesis C3 on C4. Facet alignment is maintained. Skull base and vertebrae: No acute fracture. No primary bone lesion or focal pathologic process. Soft tissues and spinal canal: No prevertebral fluid or swelling. No visible canal hematoma. Disc levels: Diffuse degenerative changes with advanced disease at C5-C6 and C6-C7. Facet degenerative changes at multiple levels with bilateral foraminal narrowing Upper chest: Negative. Other: None IMPRESSION: 1. No CT evidence for acute intracranial abnormality. Atrophy and mild chronic small vessel ischemic changes of the white matter. 2. Reversal of cervical lordosis with degenerative changes. No acute osseous abnormality. Electronically Signed   By: Jasmine Pang M.D.   On: 10/06/2019 21:03   CT Cervical Spine Wo Contrast  Result Date: 10/06/2019 CLINICAL DATA:  Dementia with fall EXAM: CT HEAD WITHOUT CONTRAST CT CERVICAL SPINE WITHOUT CONTRAST TECHNIQUE: Multidetector CT imaging of the head and cervical spine was performed following the standard protocol without intravenous contrast. Multiplanar CT image reconstructions of the cervical spine were also generated. COMPARISON:  CT brain 05/01/2018 FINDINGS: CT HEAD FINDINGS Brain: No  acute territorial infarction, hemorrhage, or intracranial mass. Moderate atrophy. Mild hypodensity in the white matter consistent with chronic small vessel ischemic change. Stable ventricle size. Vascular: No hyperdense vessels.  Carotid vascular calcification Skull: Normal. Negative for fracture or focal lesion. Sinuses/Orbits: No acute finding. Other: None CT CERVICAL SPINE FINDINGS Alignment: Reversal of cervical lordosis. Trace anterolisthesis C3 on C4. Facet alignment is maintained. Skull base and vertebrae: No acute fracture. No primary bone lesion or focal pathologic process. Soft tissues and spinal canal: No prevertebral fluid or swelling. No visible canal hematoma. Disc levels: Diffuse degenerative changes with advanced disease at C5-C6 and C6-C7. Facet degenerative changes at multiple levels with bilateral foraminal narrowing Upper chest: Negative. Other: None IMPRESSION: 1. No CT evidence for acute intracranial abnormality. Atrophy and mild chronic small vessel ischemic changes of the white matter. 2. Reversal of cervical lordosis with degenerative changes. No acute osseous abnormality. Electronically Signed   By: Jasmine Pang M.D.   On: 10/06/2019 21:03   DG Hip Unilat W or Wo Pelvis 2-3 Views Right  Result Date: 10/06/2019 CLINICAL DATA:  Fall, right leg pain EXAM: DG HIP (WITH OR WITHOUT  PELVIS) 2-3V RIGHT COMPARISON:  None. FINDINGS: There is a right femoral intertrochanteric fracture. No significant angulation. No subluxation or dislocation. Age-indeterminate right inferior pubic ramus fracture noted. IMPRESSION: Right femoral intertrochanteric fracture. Age indeterminate right inferior pubic ramus fracture. Electronically Signed   By: Charlett Nose M.D.   On: 10/06/2019 21:12    Positive ROS: All other systems have been reviewed and were otherwise negative with the exception of those mentioned in the HPI and as above.  Physical Exam: General: Sleepy, no acute distress, follows commands,  oriented to person Cardiovascular: No pedal edema Respiratory: No cyanosis, no use of accessory musculature GI: No organomegaly, abdomen is soft and non-tender Skin: No lesions in the area of chief complaint Neurologic: Sensation intact distally Psychiatric: Patient is not competent for consent Lymphatic: No axillary or cervical lymphadenopathy  MUSCULOSKELETAL: right leg short, externally rotated. Compartments soft. Good cap refill. Motor and sensory intact distally.  Assessment: Right hip fracture, closed, displaced Anticoagulated  Plan: She is on Xarelto, last dose yesterday and therefore surgery is delayed. Case discussed with Dr. Martha Clan, and he will perform surgery likely this weekend based on her medical status at that time. Continue to hold Xarelto. Please call with questions.    Lyndle Herrlich, MD    10/07/2019 11:10 AM

## 2019-10-07 NOTE — Evaluation (Signed)
Clinical/Bedside Swallow Evaluation Patient Details  Name: Christina Wise MRN: [id number] Date of Birth: 1921-05-11  Today's Date: 10/07/2019 Time: SLP Start Time (ACUTE ONLY): 1200 SLP Stop Time (ACUTE ONLY): 1239 SLP Time Calculation (min) (ACUTE ONLY): 39 min  Past Medical History:  Past Medical History:  Diagnosis Date  . Hearing impairment   . Hypertension   . Mild dementia (HCC)   . Thyroid disease    Past Surgical History:  Past Surgical History:  Procedure Laterality Date  . CHOLECYSTECTOMY     HPI:  Per admitting H and P " Christina Wise is a 86 y.o. female with history of dementia, CKD, hypothyroidism, hypertension, who presents after a fall.   Assessment / Plan / Recommendation Clinical Impression  Bedside swallow eval today revealed mild dysphagia but no overt s/s of aspiration with any tested consistency. Pt had some difficulty following directions for a thorough oral mech exam, mostly secondary to her inability to hear adequately. Pt was pleasant and cooperative throughout assessment and was aware of her recent fall affecting her hip. Oral transit was adequate for liquids and purees with no overt s/s of aspiration. Pt did present with frequent belching after sips of thin likely indicating some esophageal dysphagia. Given a bite of graham cracker, Pt needed extended time to chew and swallow the dry bolus. She was eventually able to clear oral cavity of residue given several sips of thin liquids. Pt is currently on a calorie count. At this time, rec Dysphagia 2, chopped foods with thin liquids for ease of chewing. Meds crushed in applesauce or small ones can be tried whole in applesauce. ST to follow up with toleration of diet ad adjust as needed. Possible plans for hip surgery this weekend. Will follow. SLP Visit Diagnosis: Dysphagia, oral phase (R13.11)    Aspiration Risk  Mild aspiration risk    Diet Recommendation Dysphagia 2 (Fine chop)   Medication  Administration: Crushed with puree Supervision: Staff to assist with self feeding;Full supervision/cueing for compensatory strategies Compensations: Slow rate;Minimize environmental distractions;Small sips/bites Postural Changes: Seated upright at 90 degrees;Remain upright for at least 30 minutes after po intake    Other  Recommendations     Follow up Recommendations Skilled Nursing facility      Frequency and Duration min 2x/week  1 week       Prognosis Prognosis for Safe Diet Advancement: Good      Swallow Study   General Date of Onset: 10/06/19 HPI: Per admitting H and P " Christina Wise is a 86 y.o. female with history of dementia, CKD, hypothyroidism, hypertension, who presents after a fall. Type of Study: Bedside Swallow Evaluation Diet Prior to this Study: Regular Temperature Spikes Noted: No Respiratory Status: Room air History of Recent Intubation: No Behavior/Cognition: Alert;Cooperative;Pleasant mood Oral Care Completed by SLP: No Vision: Functional for self-feeding Self-Feeding Abilities: Needs set up;Needs assist Patient Positioning: Upright in bed Baseline Vocal Quality: Hoarse;Low vocal intensity    Oral/Motor/Sensory Function Overall Oral Motor/Sensory Function: Mild impairment Facial Symmetry: Within Functional Limits Facial Strength: Within Functional Limits Lingual ROM: Within Functional Limits Lingual Symmetry: Within Functional Limits Lingual Strength: Reduced   Ice Chips Ice chips: Not tested   Thin Liquid Thin Liquid: Within functional limits Presentation: Cup;Spoon;Straw;Self Fed    Nectar Thick Nectar Thick Liquid: Not tested   Honey Thick Honey Thick Liquid: Not tested   Puree Puree: Within functional limits Presentation: Self Fed;Spoon   Solid     Solid: Impaired Presentation: Self  Fed Oral Phase Impairments: Impaired mastication;Reduced lingual movement/coordination Oral Phase Functional Implications: Prolonged oral transit       Eather Colas 10/07/2019,12:40 PM

## 2019-10-07 NOTE — Care Management Important Message (Signed)
Important Message  Patient Details  Name: Christina Wise MRN: [id number] Date of Birth: 1921/07/01   Medicare Important Message Given:  Yes  Initial Medicare IM given by Patient Access Associate on 10/07/2019 at 3:48pm.   [name] 10/07/2019, 5:11 PM

## 2019-10-07 NOTE — Progress Notes (Signed)
PROGRESS NOTE  GIANAH BATT UXL:[id number] DOB: [birth year]   PCP: Patient, No Pcp Per  Patient is from: ALF.  Completely independent with ADLs at baseline per daughter.  DOA: 10/06/2019 LOS: 1  Brief Narrative / Interim history: 86 year old female with history of advanced dementia, CKD-3B, HTN, hypothyroidism, anxiety/depression, superficial DVT on low-dose Xarelto brought to ED from ALF after unwitnessed fall and admitted for right femoral intertrochanteric fracture.  CT head and cervical spine without acute finding.  Orthopedic surgery consulted.  Plan is for surgical repair after Xarelto washout.  Last dose of Xarelto was the day of admission.  Subjective: Seen and examined earlier this morning.  No major events overnight of this morning.  Patient is pleasantly confused and not able to provide history.  She does not respond to questions or follows command.  Somewhat restless in bed.    Objective: Vitals:   10/07/19 0000 10/07/19 0030 10/07/19 0126 10/07/19 0928  BP: (!) 163/78  (!) 174/90 (!) 163/63  Pulse:  74 65 63  Resp:  19 17 17   Temp:   97.7 F (36.5 C) 98.2 F (36.8 C)  TempSrc:   Axillary   SpO2:  94% 95% 97%  Weight:        Intake/Output Summary (Last 24 hours) at 10/07/2019 1157 Last data filed at 10/07/2019 0950 Gross per 24 hour  Intake 120 ml  Output --  Net 120 ml   Filed Weights   10/06/19 2015  Weight: 68 kg    Examination:  GENERAL: Confused.  Somewhat restless. HEENT: MMM.  Vision and hearing grossly intact.  NECK: Supple.  No apparent JVD.  RESP: On RA.  No IWOB.  Fair aeration bilaterally. CVS:  RRR. Heart sounds normal.  ABD/GI/GU: BS+. Abd soft, NTND.  MSK/EXT: No apparent deformity.  No edema. SKIN: no apparent skin lesion or wound NEURO: Awake.  Not oriented.  Does not follows command.  PERRL.  No facial asymmetry.  No apparent focal neuro deficit. PSYCH: Somewhat restless.  No agitation.  Procedures:  None  Microbiology  summarized: COVID-19 PCR negative.  Assessment & Plan: Unwitnessed fall at nursing facility Acute right femoral intratrochanteric fracture -Orthopedic surgery managing.  Plan for surgical repair after Xarelto washout-likely this weekend. -Pain control.  SCD for VTE prophylaxis -Gentle IV fluid.  AKI: Baseline Cr 0.7> 1.2 (admit)> 0.99.  Likely prerenal -Gentle IV fluid. -Check CK -Avoid nephrotoxic meds.  Essential hypertension: BP slightly elevated.  Partly due to pain -Increase amlodipine to 10 mg daily -Pain control.  Hyperglycemia without diagnosis of diabetes -Check hemoglobin A1c  Anxiety/depression -Continue home medications  Dementia without behavioral disturbance -Reorientation and delirium precautions  Hypothyroidism -Continue home Synthroid  History of superficial DVT on low-dose Xarelto -Xarelto on hold  GOC/DNR/DNI-appropriate. -Consult palliative medicine in case she doesn't do well after surgery  Body mass index is 25.75 kg/m.         DVT prophylaxis:  SCDs Start: 10/06/19 2211  Code Status: DNR/DNI Family Communication: Attempted to call patient's son for update but no answer.  Did not leave a voicemail. Status is: Inpatient  Remains inpatient appropriate because:IV treatments appropriate due to intensity of illness or inability to take PO and Inpatient level of care appropriate due to severity of illness.  Surgical repair of right hip   Dispo: The patient is from: ALF              Anticipated d/c is to: SNF  Anticipated d/c date is: > 3 days              Patient currently is not medically stable to d/c.       Consultants:  Orthopedic surgery   Sch Meds:  Scheduled Meds: . amLODipine  5 mg Oral Daily  . divalproex  125 mg Oral Q12H  . levothyroxine  100 mcg Oral Daily  . sertraline  25 mg Oral Daily   Continuous Infusions: PRN Meds:.ALPRAZolam, HYDROcodone-acetaminophen, morphine injection, polyethylene  glycol  Antimicrobials: Anti-infectives (From admission, onward)   None       I have personally reviewed the following labs and images: CBC: Recent Labs  Lab 10/06/19 2012 10/07/19 0513  WBC 9.6 10.4  NEUTROABS 7.7  --   HGB 13.0 11.3*  HCT 38.8 33.4*  MCV 85.5 84.8  PLT 171 174   BMP &GFR Recent Labs  Lab 10/06/19 2012 10/07/19 0513  NA 138 139  K 4.2 4.5  CL 102 103  CO2 24 26  GLUCOSE 163* 150*  BUN 25* 21  CREATININE 1.21* 0.99  CALCIUM 9.2 8.7*   CrCl cannot be calculated (Unknown ideal weight.). Liver & Pancreas: No results for input(s): AST, ALT, ALKPHOS, BILITOT, PROT, ALBUMIN in the last 168 hours. No results for input(s): LIPASE, AMYLASE in the last 168 hours. No results for input(s): AMMONIA in the last 168 hours. Diabetic: No results for input(s): HGBA1C in the last 72 hours. No results for input(s): GLUCAP in the last 168 hours. Cardiac Enzymes: No results for input(s): CKTOTAL, CKMB, CKMBINDEX, TROPONINI in the last 168 hours. No results for input(s): PROBNP in the last 8760 hours. Coagulation Profile: No results for input(s): INR, PROTIME in the last 168 hours. Thyroid Function Tests: No results for input(s): TSH, T4TOTAL, FREET4, T3FREE, THYROIDAB in the last 72 hours. Lipid Profile: No results for input(s): CHOL, HDL, LDLCALC, TRIG, CHOLHDL, LDLDIRECT in the last 72 hours. Anemia Panel: No results for input(s): VITAMINB12, FOLATE, FERRITIN, TIBC, IRON, RETICCTPCT in the last 72 hours. Urine analysis:    Component Value Date/Time   COLORURINE YELLOW (A) 05/01/2018 1616   APPEARANCEUR CLEAR (A) 05/01/2018 1616   LABSPEC 1.010 05/01/2018 1616   PHURINE 7.0 05/01/2018 1616   GLUCOSEU NEGATIVE 05/01/2018 1616   HGBUR NEGATIVE 05/01/2018 1616   BILIRUBINUR NEGATIVE 05/01/2018 1616   KETONESUR 5 (A) 05/01/2018 1616   PROTEINUR NEGATIVE 05/01/2018 1616   NITRITE NEGATIVE 05/01/2018 1616   LEUKOCYTESUR NEGATIVE 05/01/2018 1616   Sepsis  Labs: Invalid input(s): PROCALCITONIN, LACTICIDVEN  Microbiology: Recent Results (from the past 240 hour(s))  SARS Coronavirus 2 by RT PCR (hospital order, performed in [facility] hospital lab) Nasopharyngeal Nasopharyngeal Swab     Status: None   Collection Time: 10/06/19  9:57 PM   Specimen: Nasopharyngeal Swab  Result Value Ref Range Status   SARS Coronavirus 2 NEGATIVE NEGATIVE Final    Comment: (NOTE) SARS-CoV-2 target nucleic acids are NOT DETECTED.  The SARS-CoV-2 RNA is generally detectable in upper and lower respiratory specimens during the acute phase of infection. The lowest concentration of SARS-CoV-2 viral copies this assay can detect is 250 copies / mL. A negative result does not preclude SARS-CoV-2 infection and should not be used as the sole basis for treatment or other patient management decisions.  A negative result may occur with improper specimen collection / handling, submission of specimen other than nasopharyngeal swab, presence of viral mutation(s) within the areas targeted by this assay, and inadequate number of  viral copies (<250 copies / mL). A negative result must be combined with clinical observations, patient history, and epidemiological information.  Fact Sheet for Patients:   BoilerBrush.com.cyhttps://www.fda.gov/media/136312/download  Fact Sheet for Healthcare Providers: https://pope.com/https://www.fda.gov/media/136313/download  This test is not yet approved or  cleared by the [place] FDA and has been authorized for detection and/or diagnosis of SARS-CoV-2 by FDA under an Emergency Use Authorization (EUA).  This EUA will remain in effect (meaning this test can be used) for the duration of the COVID-19 declaration under Section 564(b)(1) of the Act, 21 U.S.C. section 360bbb-3(b)(1), unless the authorization is terminated or revoked sooner.  Performed at [facility], [address] [place], [place] [address]   MRSA PCR Screening     Status: None   Collection  Time: 10/07/19  7:00 AM   Specimen: Nasopharyngeal  Result Value Ref Range Status   MRSA by PCR NEGATIVE NEGATIVE Final    Comment:        The GeneXpert MRSA Assay (FDA approved for NASAL specimens only), is one component of a comprehensive MRSA colonization surveillance program. It is not intended to diagnose MRSA infection nor to guide or monitor treatment for MRSA infections. Performed at [facility], [address] [place], [place] [address]     Radiology Studies: DG Chest 1 View  Result Date: 10/06/2019 CLINICAL DATA:  Larey SeatFell.  Shortened right leg. EXAM: CHEST  1 VIEW COMPARISON:  05/01/2018 FINDINGS: Heart size is normal. Aortic atherosclerosis. Chronic interstitial lung markings. No evidence of active infiltrate, collapse, edema or effusion. No acute chest region bone finding. IMPRESSION: No active disease. Chronic interstitial lung markings. Electronically Signed   By: Paulina FusiMark  Shogry M.D.   On: 10/06/2019 21:11   CT Head Wo Contrast  Result Date: 10/06/2019 CLINICAL DATA:  Dementia with fall EXAM: CT HEAD WITHOUT CONTRAST CT CERVICAL SPINE WITHOUT CONTRAST TECHNIQUE: Multidetector CT imaging of the head and cervical spine was performed following the standard protocol without intravenous contrast. Multiplanar CT image reconstructions of the cervical spine were also generated. COMPARISON:  CT brain 05/01/2018 FINDINGS: CT HEAD FINDINGS Brain: No acute territorial infarction, hemorrhage, or intracranial mass. Moderate atrophy. Mild hypodensity in the white matter consistent with chronic small vessel ischemic change. Stable ventricle size. Vascular: No hyperdense vessels.  Carotid vascular calcification Skull: Normal. Negative for fracture or focal lesion. Sinuses/Orbits: No acute finding. Other: None CT CERVICAL SPINE FINDINGS Alignment: Reversal of cervical lordosis. Trace anterolisthesis C3 on C4. Facet alignment is maintained. Skull base and vertebrae: No acute fracture. No  primary bone lesion or focal pathologic process. Soft tissues and spinal canal: No prevertebral fluid or swelling. No visible canal hematoma. Disc levels: Diffuse degenerative changes with advanced disease at C5-C6 and C6-C7. Facet degenerative changes at multiple levels with bilateral foraminal narrowing Upper chest: Negative. Other: None IMPRESSION: 1. No CT evidence for acute intracranial abnormality. Atrophy and mild chronic small vessel ischemic changes of the white matter. 2. Reversal of cervical lordosis with degenerative changes. No acute osseous abnormality. Electronically Signed   By: Jasmine PangKim  Fujinaga M.D.   On: 10/06/2019 21:03   CT Cervical Spine Wo Contrast  Result Date: 10/06/2019 CLINICAL DATA:  Dementia with fall EXAM: CT HEAD WITHOUT CONTRAST CT CERVICAL SPINE WITHOUT CONTRAST TECHNIQUE: Multidetector CT imaging of the head and cervical spine was performed following the standard protocol without intravenous contrast. Multiplanar CT image reconstructions of the cervical spine were also generated. COMPARISON:  CT brain 05/01/2018 FINDINGS: CT HEAD FINDINGS Brain: No acute  territorial infarction, hemorrhage, or intracranial mass. Moderate atrophy. Mild hypodensity in the white matter consistent with chronic small vessel ischemic change. Stable ventricle size. Vascular: No hyperdense vessels.  Carotid vascular calcification Skull: Normal. Negative for fracture or focal lesion. Sinuses/Orbits: No acute finding. Other: None CT CERVICAL SPINE FINDINGS Alignment: Reversal of cervical lordosis. Trace anterolisthesis C3 on C4. Facet alignment is maintained. Skull base and vertebrae: No acute fracture. No primary bone lesion or focal pathologic process. Soft tissues and spinal canal: No prevertebral fluid or swelling. No visible canal hematoma. Disc levels: Diffuse degenerative changes with advanced disease at C5-C6 and C6-C7. Facet degenerative changes at multiple levels with bilateral foraminal narrowing  Upper chest: Negative. Other: None IMPRESSION: 1. No CT evidence for acute intracranial abnormality. Atrophy and mild chronic small vessel ischemic changes of the white matter. 2. Reversal of cervical lordosis with degenerative changes. No acute osseous abnormality. Electronically Signed   By: Jasmine Pang M.D.   On: 10/06/2019 21:03   DG Hip Unilat W or Wo Pelvis 2-3 Views Right  Result Date: 10/06/2019 CLINICAL DATA:  Fall, right leg pain EXAM: DG HIP (WITH OR WITHOUT PELVIS) 2-3V RIGHT COMPARISON:  None. FINDINGS: There is a right femoral intertrochanteric fracture. No significant angulation. No subluxation or dislocation. Age-indeterminate right inferior pubic ramus fracture noted. IMPRESSION: Right femoral intertrochanteric fracture. Age indeterminate right inferior pubic ramus fracture. Electronically Signed   By: Charlett Nose M.D.   On: 10/06/2019 21:12      Kortne All T. Rayford Williamsen [facility]  If 7PM-7AM, please contact night-coverage [email or web address] 10/07/2019, 11:57 AM

## 2019-10-08 DIAGNOSIS — Z66 Do not resuscitate: Secondary | ICD-10-CM | POA: Diagnosis present

## 2019-10-08 DIAGNOSIS — W19XXXA Unspecified fall, initial encounter: Secondary | ICD-10-CM | POA: Diagnosis present

## 2019-10-08 DIAGNOSIS — Z515 Encounter for palliative care: Secondary | ICD-10-CM

## 2019-10-08 DIAGNOSIS — Z7189 Other specified counseling: Secondary | ICD-10-CM

## 2019-10-08 LAB — CBC
HCT: 32.4 % — ABNORMAL LOW (ref 36.0–46.0)
Hemoglobin: 11 g/dL — ABNORMAL LOW (ref 12.0–15.0)
MCH: 28.9 pg (ref 26.0–34.0)
MCHC: 34 g/dL (ref 30.0–36.0)
MCV: 85.3 fL (ref 80.0–100.0)
Platelets: 182 10*3/uL (ref 150–400)
RBC: 3.8 MIL/uL — ABNORMAL LOW (ref 3.87–5.11)
RDW: 14.7 % (ref 11.5–15.5)
WBC: 11.6 10*3/uL — ABNORMAL HIGH (ref 4.0–10.5)
nRBC: 0 % (ref 0.0–0.2)

## 2019-10-08 LAB — HEMOGLOBIN A1C
Hgb A1c MFr Bld: 5.8 % — ABNORMAL HIGH (ref 4.8–5.6)
Mean Plasma Glucose: 119.76 mg/dL

## 2019-10-08 LAB — RENAL FUNCTION PANEL
Albumin: 3.4 g/dL — ABNORMAL LOW (ref 3.5–5.0)
Anion gap: 11 (ref 5–15)
BUN: 23 mg/dL (ref 8–23)
CO2: 26 mmol/L (ref 22–32)
Calcium: 8.6 mg/dL — ABNORMAL LOW (ref 8.9–10.3)
Chloride: 99 mmol/L (ref 98–111)
Creatinine, Ser: 1 mg/dL (ref 0.44–1.00)
GFR calc Af Amer: 55 mL/min — ABNORMAL LOW (ref >60)
GFR calc non Af Amer: 48 mL/min — ABNORMAL LOW (ref >60)
Glucose, Bld: 111 mg/dL — ABNORMAL HIGH (ref 70–99)
Phosphorus: 2.6 mg/dL (ref 2.5–4.6)
Potassium: 3.9 mmol/L (ref 3.5–5.1)
Sodium: 136 mmol/L (ref 135–145)

## 2019-10-08 LAB — MAGNESIUM: Magnesium: 1.6 mg/dL — ABNORMAL LOW (ref 1.7–2.4)

## 2019-10-08 MED ORDER — MAGNESIUM SULFATE 2 GM/50ML IV SOLN
2.0000 g | Freq: Once | INTRAVENOUS | Status: AC
  Administered 2019-10-08: 2 g via INTRAVENOUS
  Filled 2019-10-08: qty 50

## 2019-10-08 NOTE — Consult Note (Addendum)
Consultation Note Date: 10/08/2019   Patient Name: Christina Wise  DOB: April 08, 1921  MRN: [id number]  Age / Sex: 86 y.o., female  PCP: Patient, No Pcp Per Referring Physician: Almon Hercules, MD  Reason for Consultation: Establishing goals of care  HPI/Patient Profile: 86 y.o. female  with past medical history of dementia and hypertension admitted on 10/06/2019 with hip fracture. Palliative medicine consulted for goals of care. Surgery planned for Monday.   Clinical Assessment and Goals of Care: Evaluated patient- she was pleasantly confused- oriented to her name, but not location or situation. No complaints.  Spoke with her son Christina Wise via phone. Introduced Palliative Medicine- Palliative medicine is specialized medical care for people living with serious illness. It focuses on providing relief from the symptoms and stress of a serious illness. The goal is to improve quality of life for both the patient and the family. Christina Wise is known to be quiet and pleasant. Prior to admission she was living at [facility]- walking with a walker, feeding herself. Continent and aware of when she needed to toilet. Recognizes family- but comes in and out of clarity- will confuse her son for her nephew or other family members one moment- but then able to correctly introduce him to nursing staff in another moment.  She is very hard of hearing- doesn't wear hearing aids.  We discussed surgery and complications that can occur in patient's with dementia. Discussed the need to be prepared that often- a fall and hip fracture can lead to worsening dementia and rapid decline.  Christina Wise expressed that he had been considering if doing surgery was worth the risk. He notes that if his mom was not walking well before surgery he would likely not proceed- however, wishes to proceed in hopes that she will be able to continue to walk as this  has added to her quality of life.   Primary Decision Maker NEXT OF KIN- son- Christina Wise     SUMMARY OF RECOMMENDATIONS -Continue full scope care- surgery planned for Monday -PMT will follow for outcomes following surgery and readdress goals of care if Cade declines after surgery -Recommend continued Palliative followup at discharge    Code Status/Advance Care Planning:  DNR  Additional Recommendations (Limitations, Scope, Preferences):  Full Scope Treatment  Prognosis:    Unable to determine  Discharge Planning: To Be Determined  Primary Diagnoses: Present on Admission: . Hip fracture (HCC)   I have reviewed the medical record, interviewed the patient and family, and examined the patient. The following aspects are pertinent.  Past Medical History:  Diagnosis Date  . Hearing impairment   . Hypertension   . Mild dementia (HCC)   . Thyroid disease    Social History   Socioeconomic History  . Marital status: Widowed    Spouse name: Not on file  . Number of children: Not on file  . Years of education: Not on file  . Highest education level: Not on file  Occupational History  . Not on file  Tobacco Use  .  Smoking status: Never Smoker  . Smokeless tobacco: Never Used  Substance and Sexual Activity  . Alcohol use: Never  . Drug use: Never  . Sexual activity: Not on file  Other Topics Concern  . Not on file  Social History Narrative  . Not on file   Social Determinants of Health   Financial Resource Strain:   . Difficulty of Paying Living Expenses: Not on file  Food Insecurity:   . Worried About Programme researcher, broadcasting/film/video in the Last Year: Not on file  . Ran Out of Food in the Last Year: Not on file  Transportation Needs:   . Lack of Transportation (Medical): Not on file  . Lack of Transportation (Non-Medical): Not on file  Physical Activity:   . Days of Exercise per Week: Not on file  . Minutes of Exercise per Session: Not on file  Stress:   . Feeling of  Stress : Not on file  Social Connections:   . Frequency of Communication with Friends and Family: Not on file  . Frequency of Social Gatherings with Friends and Family: Not on file  . Attends Religious Services: Not on file  . Active Member of Clubs or Organizations: Not on file  . Attends Banker Meetings: Not on file  . Marital Status: Not on file   Family History  Problem Relation Age of Onset  . Hypertension Mother    Scheduled Meds: . amLODipine  10 mg Oral Daily  . divalproex  125 mg Oral Q12H  . feeding supplement (ENSURE ENLIVE)  237 mL Oral BID BM  . levothyroxine  100 mcg Oral Daily  . sertraline  25 mg Oral Daily   Continuous Infusions: . lactated ringers 60 mL/hr at 10/08/19 0613   PRN Meds:.ALPRAZolam, HYDROcodone-acetaminophen, morphine injection, polyethylene glycol Medications Prior to Admission:  Prior to Admission medications   Medication Sig Start Date End Date Taking? Authorizing Provider  acetaminophen (TYLENOL) 500 MG tablet Take 500 mg by mouth every 6 (six) hours as needed.    [provider]  ALPRAZolam Prudy Feeler) 0.25 MG tablet Take 1 tablet (0.25 mg total) by mouth 2 (two) times daily as needed for anxiety. 05/06/18   Enid Baas, MD  amLODipine (NORVASC) 5 MG tablet Take 5 mg by mouth daily. 03/02/18   [provider]  citalopram (CELEXA) 20 MG tablet Take 20 mg by mouth daily. 01/02/18   [provider]  feeding supplement, ENSURE ENLIVE, (ENSURE ENLIVE) LIQD Take 237 mLs by mouth 2 (two) times daily between meals. 05/06/18   Enid Baas, MD  Glucosamine-Chondroitin-MSM-D3 TABS Take 1 tablet by mouth daily.    [provider]  levothyroxine (SYNTHROID, LEVOTHROID) 50 MCG tablet Take 50 mcg by mouth daily. 03/25/18   [provider]  montelukast (SINGULAIR) 10 MG tablet Take 10 mg by mouth at bedtime. 11/24/17   [provider]  XARELTO 10 MG TABS tablet Take 10 mg by mouth daily.  03/03/19   [provider]   Allergies  Allergen Reactions  . Alendronate Other (Wise Comments)    GI upset  . Codeine Nausea And Vomiting  . Risedronate Other (Wise Comments)    GI Upset   Review of Systems  Unable to perform ROS: Dementia    Physical Exam Vitals and nursing note reviewed.  Constitutional:      General: She is not in acute distress.    Comments: Thin  Cardiovascular:     Rate and Rhythm: Normal rate.  Pulmonary:     Effort: Pulmonary effort is normal.  Neurological:     Mental Status: She is alert. She is disoriented.  Psychiatric:     Comments: pleasant     Vital Signs: BP 132/68 (BP Location: Left Arm)   Pulse 79   Temp 98.6 F (37 C) (Oral)   Resp 20   Wt 68 kg   SpO2 99%   BMI 25.75 kg/m  Pain Scale: PAINAD POSS *Wise Group Information*: 1-Acceptable,Awake and alert Pain Score: Asleep   SpO2: SpO2: 99 % O2 Device:SpO2: 99 % O2 Flow Rate: .   IO: Intake/output summary:   Intake/Output Summary (Last 24 hours) at 10/08/2019 1556 Last data filed at 10/08/2019 1500 Gross per 24 hour  Intake 1360.75 ml  Output 175 ml  Net 1185.75 ml    LBM: Last BM Date:  (PTA) Baseline Weight: Weight: 68 kg Most recent weight: Weight: 68 kg     Palliative Assessment/Data: PPS: 30 %     Thank you for this consult. Palliative medicine will continue to follow and assist as needed.   Time In: 1556 Time Out: 1609 Time Total: 73 minutes Greater than 50%  of this time was spent counseling and coordinating care related to the above assessment and plan.  Signed by: Ocie Bob, AGNP-C Palliative Medicine    Please contact Palliative Medicine Team phone at [phone] for questions and concerns.  For individual provider: See Loretha Stapler

## 2019-10-08 NOTE — Progress Notes (Signed)
Palliative-  Thank you for this consult. Chart reviewed and patient examined.  I attempted to call patient's son for discussion. Left message on mobile number requesting return call.   Ocie Bob, AGNP-C Palliative Medicine  Please call Palliative Medicine team phone with any questions [phone]. For individual providers please see AMION.  No charge

## 2019-10-08 NOTE — Progress Notes (Signed)
PROGRESS NOTE  Christina Wise NKN:[id number] DOB: [birth year]   PCP: Patient, No Pcp Per  Patient is from: ALF.  Completely independent with ADLs at baseline per daughter.  DOA: 10/06/2019 LOS: 2  Brief Narrative / Interim history: 86 year old female with history of advanced dementia, CKD-3B, HTN, hypothyroidism, anxiety/depression, superficial DVT on low-dose Xarelto brought to ED from ALF after unwitnessed fall and admitted for right femoral intertrochanteric fracture.  CT head and cervical spine without acute finding.  Orthopedic surgery consulted.  Plan is for surgical repair after Xarelto washout on 10/10/2019.  Last dose of Xarelto reportedly on the day of admission.   Subjective:  Seen and examined earlier this morning.  No major events overnight of this morning.  Patient is pleasantly confused and not able to provide history of follows commands.  Does not appear to be in distress.  Objective: Vitals:   10/07/19 0928 10/07/19 1600 10/07/19 2311 10/08/19 0758  BP: (!) 163/63 (!) 140/53 (!) 151/62 (!) 146/66  Pulse: 63 74 61 81  Resp: 17 17 17 18   Temp: 98.2 F (36.8 C) 98.4 F (36.9 C) 98 F (36.7 C) 98.7 F (37.1 C)  TempSrc:   Oral   SpO2: 97% 94% 95% 93%  Weight:        Intake/Output Summary (Last 24 hours) at 10/08/2019 1355 Last data filed at 10/08/2019 1300 Gross per 24 hour  Intake 662.78 ml  Output 175 ml  Net 487.78 ml   Filed Weights   10/06/19 2015  Weight: 68 kg    Examination:  GENERAL: Resting comfortably.  Pleasantly confused. HEENT: MMM.  Vision and hearing grossly intact.  NECK: Supple.  No apparent JVD.  RESP: On RA.  No IWOB.  Fair aeration bilaterally. CVS:  RRR. Heart sounds normal.  ABD/GI/GU: BS+. Abd soft, NTND.  MSK/EXT:  Moves extremities. No apparent deformity. No edema.  SKIN: no apparent skin lesion or wound NEURO: Awake but not oriented.  Does not follows command.  PERRL.  No facial asymmetry.  No apparent focal neuro  deficit. PSYCH: Calm.  No apparent distress.  No agitation.  Procedures:  None  Microbiology summarized: COVID-19 PCR negative.  Assessment & Plan: Unwitnessed fall at nursing facility Acute right femoral intratrochanteric fracture -Plan for surgical repair after Xarelto washout-likely on 10/10/2019. -Pain control.  SCD for VTE prophylaxis -Continue gentle IV fluid.  AKI: Baseline Cr 0.7> 1.2 (admit)> 1.0.  Likely prerenal.  CK within normal. -Continue gentle IV fluid. -Avoid nephrotoxic meds.  Essential hypertension: BP within fair range. -Continue amlodipine to 10 mg daily -Pain control.  Hyperglycemia without diagnosis of diabetes: A1c 5.8%.  Anxiety/depression: Stable -Continue home medications  Hypomagnesemia: Mg 1.6. -Replenish and recheck.  Dementia without behavioral disturbance -Reorientation and delirium precautions  Hypothyroidism -Continue home Synthroid  History of superficial DVT on low-dose Xarelto -Xarelto on hold pending surgery.  GOC/DNR/DNI-appropriate. -Palliative care consulted.  Appreciate input.  Body mass index is 25.75 kg/m.         DVT prophylaxis:  SCDs Start: 10/06/19 2211  Code Status: DNR/DNI Family Communication: Attempted to call patient's son for update but no answer.  Status is: Inpatient  Remains inpatient appropriate because:IV treatments appropriate due to intensity of illness or inability to take PO and Inpatient level of care appropriate due to severity of illness.  Surgical repair of right hip   Dispo: The patient is from: ALF              Anticipated d/c is to:  SNF              Anticipated d/c date is: > 3 days              Patient currently is not medically stable to d/c.       Consultants:  Orthopedic surgery Palliative medicine   Sch Meds:  Scheduled Meds: . amLODipine  10 mg Oral Daily  . divalproex  125 mg Oral Q12H  . feeding supplement (ENSURE ENLIVE)  237 mL Oral BID BM  . levothyroxine   100 mcg Oral Daily  . sertraline  25 mg Oral Daily   Continuous Infusions: . lactated ringers 60 mL/hr at 10/07/19 1800   PRN Meds:.ALPRAZolam, HYDROcodone-acetaminophen, morphine injection, polyethylene glycol  Antimicrobials: Anti-infectives (From admission, onward)   None       I have personally reviewed the following labs and images: CBC: Recent Labs  Lab 10/06/19 2012 10/07/19 0513 10/08/19 0532  WBC 9.6 10.4 11.6*  NEUTROABS 7.7  --   --   HGB 13.0 11.3* 11.0*  HCT 38.8 33.4* 32.4*  MCV 85.5 84.8 85.3  PLT 171 174 182   BMP &GFR Recent Labs  Lab 10/06/19 2012 10/07/19 0513 10/08/19 0532  NA 138 139 136  K 4.2 4.5 3.9  CL 102 103 99  CO2 24 26 26   GLUCOSE 163* 150* 111*  BUN 25* 21 23  CREATININE 1.21* 0.99 1.00  CALCIUM 9.2 8.7* 8.6*  MG  --   --  1.6*  PHOS  --   --  2.6   CrCl cannot be calculated (Unknown ideal weight.). Liver & Pancreas: Recent Labs  Lab 10/08/19 0532  ALBUMIN 3.4*   No results for input(s): LIPASE, AMYLASE in the last 168 hours. No results for input(s): AMMONIA in the last 168 hours. Diabetic: Recent Labs    10/08/19 0532  HGBA1C 5.8*   No results for input(s): GLUCAP in the last 168 hours. Cardiac Enzymes: Recent Labs  Lab 10/07/19 1224  CKTOTAL 159   No results for input(s): PROBNP in the last 8760 hours. Coagulation Profile: No results for input(s): INR, PROTIME in the last 168 hours. Thyroid Function Tests: No results for input(s): TSH, T4TOTAL, FREET4, T3FREE, THYROIDAB in the last 72 hours. Lipid Profile: No results for input(s): CHOL, HDL, LDLCALC, TRIG, CHOLHDL, LDLDIRECT in the last 72 hours. Anemia Panel: No results for input(s): VITAMINB12, FOLATE, FERRITIN, TIBC, IRON, RETICCTPCT in the last 72 hours. Urine analysis:    Component Value Date/Time   COLORURINE YELLOW (A) 05/01/2018 1616   APPEARANCEUR CLEAR (A) 05/01/2018 1616   LABSPEC 1.010 05/01/2018 1616   PHURINE 7.0 05/01/2018 1616    GLUCOSEU NEGATIVE 05/01/2018 1616   HGBUR NEGATIVE 05/01/2018 1616   BILIRUBINUR NEGATIVE 05/01/2018 1616   KETONESUR 5 (A) 05/01/2018 1616   PROTEINUR NEGATIVE 05/01/2018 1616   NITRITE NEGATIVE 05/01/2018 1616   LEUKOCYTESUR NEGATIVE 05/01/2018 1616   Sepsis Labs: Invalid input(s): PROCALCITONIN, LACTICIDVEN  Microbiology: Recent Results (from the past 240 hour(s))  SARS Coronavirus 2 by RT PCR (hospital order, performed in [facility] hospital lab) Nasopharyngeal Nasopharyngeal Swab     Status: None   Collection Time: 10/06/19  9:57 PM   Specimen: Nasopharyngeal Swab  Result Value Ref Range Status   SARS Coronavirus 2 NEGATIVE NEGATIVE Final    Comment: (NOTE) SARS-CoV-2 target nucleic acids are NOT DETECTED.  The SARS-CoV-2 RNA is generally detectable in upper and lower respiratory specimens during the acute phase of infection. The lowest  concentration of SARS-CoV-2 viral copies this assay can detect is 250 copies / mL. A negative result does not preclude SARS-CoV-2 infection and should not be used as the sole basis for treatment or other patient management decisions.  A negative result may occur with improper specimen collection / handling, submission of specimen other than nasopharyngeal swab, presence of viral mutation(s) within the areas targeted by this assay, and inadequate number of viral copies (<250 copies / mL). A negative result must be combined with clinical observations, patient history, and epidemiological information.  Fact Sheet for Patients:   BoilerBrush.com.cy  Fact Sheet for Healthcare Providers: https://pope.com/  This test is not yet approved or  cleared by the [place] FDA and has been authorized for detection and/or diagnosis of SARS-CoV-2 by FDA under an Emergency Use Authorization (EUA).  This EUA will remain in effect (meaning this test can be used) for the duration of the COVID-19  declaration under Section 564(b)(1) of the Act, 21 U.S.C. section 360bbb-3(b)(1), unless the authorization is terminated or revoked sooner.  Performed at [facility], [address] [place], [place] [address]   MRSA PCR Screening     Status: None   Collection Time: 10/07/19  7:00 AM   Specimen: Nasopharyngeal  Result Value Ref Range Status   MRSA by PCR NEGATIVE NEGATIVE Final    Comment:        The GeneXpert MRSA Assay (FDA approved for NASAL specimens only), is one component of a comprehensive MRSA colonization surveillance program. It is not intended to diagnose MRSA infection nor to guide or monitor treatment for MRSA infections. Performed at [facility], [address], [place], [place] [address]     Radiology Studies: No results found.    Aven Cegielski T. Dalani Mette [facility]  If 7PM-7AM, please contact night-coverage [email or web address] 10/08/2019, 1:55 PM

## 2019-10-08 NOTE — Progress Notes (Signed)
Subjective:  Patient reports pain as mild.    Objective:   VITALS:   Vitals:   10/07/19 0928 10/07/19 1600 10/07/19 2311 10/08/19 0758  BP: (!) 163/63 (!) 140/53 (!) 151/62 (!) 146/66  Pulse: 63 74 61 81  Resp: 17 17 17 18   Temp: 98.2 F (36.8 C) 98.4 F (36.9 C) 98 F (36.7 C) 98.7 F (37.1 C)  TempSrc:   Oral   SpO2: 97% 94% 95% 93%  Weight:        PHYSICAL EXAM:  Neurologically intact ABD soft Neurovascular intact Sensation intact distally Intact pulses distally Dorsiflexion/Plantar flexion intact No cellulitis present Compartment soft   right leg short, externally rotated. Compartments soft. Good cap refill. Motor and sensory intact distally.  LABS  Results for orders placed or performed during the hospital encounter of 10/06/19 (from the past 24 hour(s))  Hemoglobin A1c     Status: Abnormal   Collection Time: 10/08/19  5:32 AM  Result Value Ref Range   Hgb A1c MFr Bld 5.8 (H) 4.8 - 5.6 %   Mean Plasma Glucose 119.76 mg/dL  CBC     Status: Abnormal   Collection Time: 10/08/19  5:32 AM  Result Value Ref Range   WBC 11.6 (H) 4.0 - 10.5 K/uL   RBC 3.80 (L) 3.87 - 5.11 MIL/uL   Hemoglobin 11.0 (L) 12.0 - 15.0 g/dL   HCT 10/10/19 (L) 36 - 46 %   MCV 85.3 80.0 - 100.0 fL   MCH 28.9 26.0 - 34.0 pg   MCHC 34.0 30.0 - 36.0 g/dL   RDW 70.6 23.7 - 62.8 %   Platelets 182 150 - 400 K/uL   nRBC 0.0 0.0 - 0.2 %  Magnesium     Status: Abnormal   Collection Time: 10/08/19  5:32 AM  Result Value Ref Range   Magnesium 1.6 (L) 1.7 - 2.4 mg/dL  Renal function panel     Status: Abnormal   Collection Time: 10/08/19  5:32 AM  Result Value Ref Range   Sodium 136 135 - 145 mmol/L   Potassium 3.9 3.5 - 5.1 mmol/L   Chloride 99 98 - 111 mmol/L   CO2 26 22 - 32 mmol/L   Glucose, Bld 111 (H) 70 - 99 mg/dL   BUN 23 8 - 23 mg/dL   Creatinine, Ser 10/10/19 0.44 - 1.00 mg/dL   Calcium 8.6 (L) 8.9 - 10.3 mg/dL   Phosphorus 2.6 2.5 - 4.6 mg/dL   Albumin 3.4 (L) 3.5 - 5.0 g/dL   GFR  calc non Af Amer 48 (L) >60 mL/min   GFR calc Af Amer 55 (L) >60 mL/min   Anion gap 11 5 - 15    DG Chest 1 View  Result Date: 10/06/2019 CLINICAL DATA:  10/08/2019.  Shortened right leg. EXAM: CHEST  1 VIEW COMPARISON:  05/01/2018 FINDINGS: Heart size is normal. Aortic atherosclerosis. Chronic interstitial lung markings. No evidence of active infiltrate, collapse, edema or effusion. No acute chest region bone finding. IMPRESSION: No active disease. Chronic interstitial lung markings. Electronically Signed   By: 05/03/2018 M.D.   On: 10/06/2019 21:11   CT Head Wo Contrast  Result Date: 10/06/2019 CLINICAL DATA:  Dementia with fall EXAM: CT HEAD WITHOUT CONTRAST CT CERVICAL SPINE WITHOUT CONTRAST TECHNIQUE: Multidetector CT imaging of the head and cervical spine was performed following the standard protocol without intravenous contrast. Multiplanar CT image reconstructions of the cervical spine were also generated. COMPARISON:  CT brain 05/01/2018 FINDINGS: CT  HEAD FINDINGS Brain: No acute territorial infarction, hemorrhage, or intracranial mass. Moderate atrophy. Mild hypodensity in the white matter consistent with chronic small vessel ischemic change. Stable ventricle size. Vascular: No hyperdense vessels.  Carotid vascular calcification Skull: Normal. Negative for fracture or focal lesion. Sinuses/Orbits: No acute finding. Other: None CT CERVICAL SPINE FINDINGS Alignment: Reversal of cervical lordosis. Trace anterolisthesis C3 on C4. Facet alignment is maintained. Skull base and vertebrae: No acute fracture. No primary bone lesion or focal pathologic process. Soft tissues and spinal canal: No prevertebral fluid or swelling. No visible canal hematoma. Disc levels: Diffuse degenerative changes with advanced disease at C5-C6 and C6-C7. Facet degenerative changes at multiple levels with bilateral foraminal narrowing Upper chest: Negative. Other: None IMPRESSION: 1. No CT evidence for acute intracranial  abnormality. Atrophy and mild chronic small vessel ischemic changes of the white matter. 2. Reversal of cervical lordosis with degenerative changes. No acute osseous abnormality. Electronically Signed   By: Jasmine Pang M.D.   On: 10/06/2019 21:03   CT Cervical Spine Wo Contrast  Result Date: 10/06/2019 CLINICAL DATA:  Dementia with fall EXAM: CT HEAD WITHOUT CONTRAST CT CERVICAL SPINE WITHOUT CONTRAST TECHNIQUE: Multidetector CT imaging of the head and cervical spine was performed following the standard protocol without intravenous contrast. Multiplanar CT image reconstructions of the cervical spine were also generated. COMPARISON:  CT brain 05/01/2018 FINDINGS: CT HEAD FINDINGS Brain: No acute territorial infarction, hemorrhage, or intracranial mass. Moderate atrophy. Mild hypodensity in the white matter consistent with chronic small vessel ischemic change. Stable ventricle size. Vascular: No hyperdense vessels.  Carotid vascular calcification Skull: Normal. Negative for fracture or focal lesion. Sinuses/Orbits: No acute finding. Other: None CT CERVICAL SPINE FINDINGS Alignment: Reversal of cervical lordosis. Trace anterolisthesis C3 on C4. Facet alignment is maintained. Skull base and vertebrae: No acute fracture. No primary bone lesion or focal pathologic process. Soft tissues and spinal canal: No prevertebral fluid or swelling. No visible canal hematoma. Disc levels: Diffuse degenerative changes with advanced disease at C5-C6 and C6-C7. Facet degenerative changes at multiple levels with bilateral foraminal narrowing Upper chest: Negative. Other: None IMPRESSION: 1. No CT evidence for acute intracranial abnormality. Atrophy and mild chronic small vessel ischemic changes of the white matter. 2. Reversal of cervical lordosis with degenerative changes. No acute osseous abnormality. Electronically Signed   By: Jasmine Pang M.D.   On: 10/06/2019 21:03   DG Hip Unilat W or Wo Pelvis 2-3 Views Right  Result  Date: 10/06/2019 CLINICAL DATA:  Fall, right leg pain EXAM: DG HIP (WITH OR WITHOUT PELVIS) 2-3V RIGHT COMPARISON:  None. FINDINGS: There is a right femoral intertrochanteric fracture. No significant angulation. No subluxation or dislocation. Age-indeterminate right inferior pubic ramus fracture noted. IMPRESSION: Right femoral intertrochanteric fracture. Age indeterminate right inferior pubic ramus fracture. Electronically Signed   By: Charlett Nose M.D.   On: 10/06/2019 21:12    Assessment/Plan:     Active Problems:   Hip fracture [facility])  She is on Xarelto and therefore surgery is delayed.  Dr. Martha Clan will perform surgery likely this weekend based on her medical status at that time. Continue to hold Xarelto. Please call with questions.   Altamese Cabal , PA-C 10/08/2019, 12:28 PM

## 2019-10-08 NOTE — Progress Notes (Signed)
Patient unable to use Incentive spirometry, fights and yells and doesn't comprehend instructions due to confusion

## 2019-10-08 NOTE — Progress Notes (Addendum)
SLP Cancellation Note  Patient Details Name: Christina Wise MRN: [id number] DOB: 1921-09-07   Cancelled treatment:       Reason Eval/Treat Not Completed: Patient declined, no reason specified (chart reviewed; Son present). Pt was not feeling well; just resting in bed. Son present. NSG monitoring pt's pain, discomfort. Pt did not want anything to drink w/ SLP. Son reported giving a few sips of water this morning stating no overt coughing or choking noted. Discussed general aspiration precautions as posted in room. Encouraged him to have pt sitting fully upright for any oral intake.  Recommend continue current diet w/ general aspiration precautions; Pills in Puree and support w/ feeding at meals. ST services will continue to monitor toleration of diet while pt is awaiting surgery, post surgery. Son and NSG agreed.     Jerilynn Som, MS, CCC-SLP Altan Kraai 10/08/2019, 12:53 PM

## 2019-10-09 ENCOUNTER — Encounter: Payer: Self-pay | Admitting: Family Medicine

## 2019-10-09 MED ORDER — CHLORHEXIDINE GLUCONATE CLOTH 2 % EX PADS
6.0000 | MEDICATED_PAD | Freq: Every day | CUTANEOUS | Status: DC
  Administered 2019-10-11 – 2019-10-14 (×2): 6 via TOPICAL

## 2019-10-09 NOTE — Progress Notes (Signed)
  Subjective:  Patient confused.  No acute distress. Surgery scheduled for the AM.  Objective:   VITALS:   Vitals:   10/08/19 1547 10/08/19 1619 10/08/19 2345 10/09/19 0829  BP: 132/68  (!) 147/75 (!) 155/70  Pulse: 79  74 78  Resp: 20  19 15   Temp: 98.6 F (37 C)  98.3 F (36.8 C) 98.3 F (36.8 C)  TempSrc: Oral  Oral Oral  SpO2: 99% 94% 93% 92%  Weight:        PHYSICAL EXAM: Right lower extremity:  Skin intact.   Shortening and external rotation observed.  Patient has palpable pedal pulses.  She is unable to follow commands.   LABS  No results found for this or any previous visit (from the past 24 hour(s)).  No results found.  Assessment/Plan:     Active Problems:   Hip fracture (HCC)   Fall   DNR (do not resuscitate)   Goals of care, counseling/discussion   Advanced care planning/counseling discussion   Palliative care by specialist  N.p.o. after midnight in preparation for surgery tomorrow. The patient should remain off anticoagulation therapy today in preparation for surgery tomorrow.  I have spoken with the patient's son to explain the details of the operation as well as the postoperative course and the risks and benefits of surgery.  He is in agreement with the plan for surgery.    , MD 10/09/2019, 12:04 PM

## 2019-10-09 NOTE — Anesthesia Preprocedure Evaluation (Addendum)
Anesthesia Evaluation  Patient identified by MRN, date of birth, ID band Patient awake    Reviewed: Allergy & Precautions, H&P , NPO status , Patient's Chart, lab work & pertinent test results, reviewed documented beta blocker date and time   Airway Mallampati: II  TM Distance: >3 FB Neck ROM: full    Dental  (+) Dental Advidsory Given, Caps, Poor Dentition   Pulmonary neg pulmonary ROS,    Pulmonary exam normal breath sounds clear to auscultation       Cardiovascular Exercise Tolerance: Good hypertension, (-) angina(-) Past MI and (-) Cardiac Stents Normal cardiovascular exam(-) dysrhythmias (-) Valvular Problems/Murmurs Rhythm:regular Rate:Normal     Neuro/Psych PSYCHIATRIC DISORDERS Dementia negative neurological ROS     GI/Hepatic negative GI ROS, Neg liver ROS,   Endo/Other  negative endocrine ROS  Renal/GU negative Renal ROS  negative genitourinary   Musculoskeletal   Abdominal   Peds  Hematology negative hematology ROS (+)   Anesthesia Other Findings Past Medical History: No date: Hearing impairment No date: Hypertension No date: Mild dementia (HCC) No date: Thyroid disease   Reproductive/Obstetrics negative OB ROS                            Anesthesia Physical Anesthesia Plan  ASA: III  Anesthesia Plan: General/Spinal and Spinal   Post-op Pain Management:    Induction: Intravenous  PONV Risk Score and Plan: 3 and Ondansetron, Dexamethasone, Propofol infusion and TIVA  Airway Management Planned: Natural Airway  Additional Equipment: None  Intra-op Plan:   Post-operative Plan:   Informed Consent: I have reviewed the patients History and Physical, chart, labs and discussed the procedure including the risks, benefits and alternatives for the proposed anesthesia with the patient or authorized representative who has indicated his/her understanding and acceptance.    Patient has DNR.  Discussed DNR with power of attorney and Suspend DNR.   History available from chart only and Consent reviewed with POA  Plan Discussed with: CRNA and Surgeon  Anesthesia Plan Comments: (Obtained phone consent from son Akirah Storck, 10/09/19 @ 1915 Corinda Gubler MD) Per son he believes last xarelto dose was Wednesday morning. Discussed R/B/A of neuraxial anesthesia technique: - rare risks of spinal/epidural hematoma, nerve damage, infection - Risk of PDPH - Risk of nausea and vomiting - Risk of conversion to general anesthesia and its associated risks, including sore throat, damage to lips/teeth/oropharynx, and rare risks such as cardiac and respiratory events.  Discussed DNR status in detail. Son agrees that it is in line with his mom's wishes to suspend DNR perioperatively to care for her. )      Anesthesia Quick Evaluation

## 2019-10-09 NOTE — Progress Notes (Signed)
PROGRESS NOTE  SHAYE ELLING DGL:[id number] DOB: [birth year]   PCP: Patient, No Pcp Per  Patient is from: ALF.  Completely independent with ADLs at baseline per daughter.  DOA: 10/06/2019 LOS: 3  Brief Narrative / Interim history: 86 year old female with history of advanced dementia, CKD-3B, HTN, hypothyroidism, anxiety/depression, superficial DVT on low-dose Xarelto brought to ED from ALF after unwitnessed fall and admitted for right femoral intertrochanteric fracture.  CT head and cervical spine without acute finding.  Orthopedic surgery consulted.  Plan is for surgical repair after Xarelto washout on 10/10/2019.  Last dose of Xarelto reportedly on the day of admission.   Subjective: Seen and examined earlier this morning.  No major events overnight of this morning.  She is more alert, awake and interactive but pleasantly confused.  Does not appear to be in distress.  Objective: Vitals:   10/08/19 1547 10/08/19 1619 10/08/19 2345 10/09/19 0829  BP: 132/68  (!) 147/75 (!) 155/70  Pulse: 79  74 78  Resp: 20  19 15   Temp: 98.6 F (37 C)  98.3 F (36.8 C) 98.3 F (36.8 C)  TempSrc: Oral  Oral Oral  SpO2: 99% 94% 93% 92%  Weight:        Intake/Output Summary (Last 24 hours) at 10/09/2019 1356 Last data filed at 10/08/2019 2300 Gross per 24 hour  Intake 1523.69 ml  Output 300 ml  Net 1223.69 ml   Filed Weights   10/06/19 2015  Weight: 68 kg    Examination:  GENERAL: No apparent distress.  Nontoxic. HEENT: MMM.  Vision and hearing grossly intact.  NECK: Supple.  No apparent JVD.  RESP: On RA.  No IWOB.  Fair aeration bilaterally. CVS:  RRR. Heart sounds normal.  ABD/GI/GU: BS+. Abd soft, NTND.  MSK/EXT:  Moves extremities. No apparent deformity. No edema.  SKIN: no apparent skin lesion or wound NEURO: Awake, alert but pleasantly confused.  No apparent focal neuro deficit. PSYCH: Calm. Normal affect. Procedures:  None  Microbiology summarized: COVID-19 PCR  negative.  Assessment & Plan: Unwitnessed fall at nursing facility Acute right femoral intratrochanteric fracture -Plan for surgical repair after Xarelto washout-likely on 10/10/2019. -Pain control.  SCD for VTE prophylaxis -Continue gentle IV fluid. -N.p.o. after midnight  AKI: Baseline Cr 0.7> 1.2 (admit)> 0.99.  Likely prerenal.  CK within normal. -Continue gentle IV fluid. -Avoid nephrotoxic meds.  Essential hypertension: BP within fair range. -Continue amlodipine to 10 mg daily -Pain control.  Hyperglycemia without diagnosis of diabetes: A1c 5.8%.  Anxiety/depression: Stable -Continue home medications  Hypomagnesemia: Replenished. -Monitor replace as appropriate  Dementia without behavioral disturbance -Reorientation and delirium precautions  Hypothyroidism -Continue home Synthroid  History of superficial DVT on low-dose Xarelto -Xarelto on hold pending surgery.  GOC/DNR/DNI-appropriate. -Palliative care consulted.  Appreciate input.  Body mass index is 25.75 kg/m.         DVT prophylaxis:  SCDs Start: 10/06/19 2211  Code Status: DNR/DNI Family Communication: Attempted to call patient's son for update but no answer.  Status is: Inpatient  Remains inpatient appropriate because:IV treatments appropriate due to intensity of illness or inability to take PO and Inpatient level of care appropriate due to severity of illness.  Surgical repair of right hip on 10/10/2019   Dispo: The patient is from: ALF              Anticipated d/c is to: SNF              Anticipated d/c date is: > 3  days              Patient currently is not medically stable to d/c.       Consultants:  Orthopedic surgery Palliative medicine   Sch Meds:  Scheduled Meds: . amLODipine  10 mg Oral Daily  . divalproex  125 mg Oral Q12H  . feeding supplement (ENSURE ENLIVE)  237 mL Oral BID BM  . levothyroxine  100 mcg Oral Daily  . sertraline  25 mg Oral Daily   Continuous  Infusions:  PRN Meds:.ALPRAZolam, HYDROcodone-acetaminophen, morphine injection, polyethylene glycol  Antimicrobials: Anti-infectives (From admission, onward)   None       I have personally reviewed the following labs and images: CBC: Recent Labs  Lab 10/06/19 2012 10/07/19 0513 10/08/19 0532  WBC 9.6 10.4 11.6*  NEUTROABS 7.7  --   --   HGB 13.0 11.3* 11.0*  HCT 38.8 33.4* 32.4*  MCV 85.5 84.8 85.3  PLT 171 174 182   BMP &GFR Recent Labs  Lab 10/06/19 2012 10/07/19 0513 10/08/19 0532  NA 138 139 136  K 4.2 4.5 3.9  CL 102 103 99  CO2 24 26 26   GLUCOSE 163* 150* 111*  BUN 25* 21 23  CREATININE 1.21* 0.99 1.00  CALCIUM 9.2 8.7* 8.6*  MG  --   --  1.6*  PHOS  --   --  2.6   CrCl cannot be calculated (Unknown ideal weight.). Liver & Pancreas: Recent Labs  Lab 10/08/19 0532  ALBUMIN 3.4*   No results for input(s): LIPASE, AMYLASE in the last 168 hours. No results for input(s): AMMONIA in the last 168 hours. Diabetic: Recent Labs    10/08/19 0532  HGBA1C 5.8*   No results for input(s): GLUCAP in the last 168 hours. Cardiac Enzymes: Recent Labs  Lab 10/07/19 1224  CKTOTAL 159   No results for input(s): PROBNP in the last 8760 hours. Coagulation Profile: No results for input(s): INR, PROTIME in the last 168 hours. Thyroid Function Tests: No results for input(s): TSH, T4TOTAL, FREET4, T3FREE, THYROIDAB in the last 72 hours. Lipid Profile: No results for input(s): CHOL, HDL, LDLCALC, TRIG, CHOLHDL, LDLDIRECT in the last 72 hours. Anemia Panel: No results for input(s): VITAMINB12, FOLATE, FERRITIN, TIBC, IRON, RETICCTPCT in the last 72 hours. Urine analysis:    Component Value Date/Time   COLORURINE YELLOW (A) 05/01/2018 1616   APPEARANCEUR CLEAR (A) 05/01/2018 1616   LABSPEC 1.010 05/01/2018 1616   PHURINE 7.0 05/01/2018 1616   GLUCOSEU NEGATIVE 05/01/2018 1616   HGBUR NEGATIVE 05/01/2018 1616   BILIRUBINUR NEGATIVE 05/01/2018 1616   KETONESUR  5 (A) 05/01/2018 1616   PROTEINUR NEGATIVE 05/01/2018 1616   NITRITE NEGATIVE 05/01/2018 1616   LEUKOCYTESUR NEGATIVE 05/01/2018 1616   Sepsis Labs: Invalid input(s): PROCALCITONIN, LACTICIDVEN  Microbiology: Recent Results (from the past 240 hour(s))  SARS Coronavirus 2 by RT PCR (hospital order, performed in [facility] hospital lab) Nasopharyngeal Nasopharyngeal Swab     Status: None   Collection Time: 10/06/19  9:57 PM   Specimen: Nasopharyngeal Swab  Result Value Ref Range Status   SARS Coronavirus 2 NEGATIVE NEGATIVE Final    Comment: (NOTE) SARS-CoV-2 target nucleic acids are NOT DETECTED.  The SARS-CoV-2 RNA is generally detectable in upper and lower respiratory specimens during the acute phase of infection. The lowest concentration of SARS-CoV-2 viral copies this assay can detect is 250 copies / mL. A negative result does not preclude SARS-CoV-2 infection and should not be used as the  sole basis for treatment or other patient management decisions.  A negative result may occur with improper specimen collection / handling, submission of specimen other than nasopharyngeal swab, presence of viral mutation(s) within the areas targeted by this assay, and inadequate number of viral copies (<250 copies / mL). A negative result must be combined with clinical observations, patient history, and epidemiological information.  Fact Sheet for Patients:   BoilerBrush.com.cy  Fact Sheet for Healthcare Providers: https://pope.com/  This test is not yet approved or  cleared by the [place] FDA and has been authorized for detection and/or diagnosis of SARS-CoV-2 by FDA under an Emergency Use Authorization (EUA).  This EUA will remain in effect (meaning this test can be used) for the duration of the COVID-19 declaration under Section 564(b)(1) of the Act, 21 U.S.C. section 360bbb-3(b)(1), unless the authorization is terminated  or revoked sooner.  Performed at [facility], [address] [place], [place] [address]   MRSA PCR Screening     Status: None   Collection Time: 10/07/19  7:00 AM   Specimen: Nasopharyngeal  Result Value Ref Range Status   MRSA by PCR NEGATIVE NEGATIVE Final    Comment:        The GeneXpert MRSA Assay (FDA approved for NASAL specimens only), is one component of a comprehensive MRSA colonization surveillance program. It is not intended to diagnose MRSA infection nor to guide or monitor treatment for MRSA infections. Performed at [facility], [address], [place], [place] [address]     Radiology Studies: No results found.    Gabrielly Mccrystal T. Hallie Ertl [facility]  If 7PM-7AM, please contact night-coverage [email or web address] 10/09/2019, 1:56 PM

## 2019-10-10 ENCOUNTER — Inpatient Hospital Stay: Payer: Medicare Other | Admitting: Anesthesiology

## 2019-10-10 ENCOUNTER — Encounter
Admission: EM | Disposition: A | Discharge status: Skilled Nursing Facility | Payer: Self-pay | Source: Skilled Nursing Facility | Attending: Student

## 2019-10-10 ENCOUNTER — Inpatient Hospital Stay: Payer: Medicare Other

## 2019-10-10 ENCOUNTER — Encounter: Payer: Self-pay | Admitting: Family Medicine

## 2019-10-10 DIAGNOSIS — E039 Hypothyroidism, unspecified: Secondary | ICD-10-CM

## 2019-10-10 DIAGNOSIS — E43 Unspecified severe protein-calorie malnutrition: Secondary | ICD-10-CM

## 2019-10-10 DIAGNOSIS — F0390 Unspecified dementia without behavioral disturbance: Secondary | ICD-10-CM

## 2019-10-10 HISTORY — PX: INTRAMEDULLARY (IM) NAIL INTERTROCHANTERIC: SHX5875

## 2019-10-10 LAB — RENAL FUNCTION PANEL
Albumin: 3.1 g/dL — ABNORMAL LOW (ref 3.5–5.0)
Anion gap: 10 (ref 5–15)
BUN: 24 mg/dL — ABNORMAL HIGH (ref 8–23)
CO2: 25 mmol/L (ref 22–32)
Calcium: 8.3 mg/dL — ABNORMAL LOW (ref 8.9–10.3)
Chloride: 99 mmol/L (ref 98–111)
Creatinine, Ser: 1.02 mg/dL — ABNORMAL HIGH (ref 0.44–1.00)
GFR calc Af Amer: 54 mL/min — ABNORMAL LOW (ref >60)
GFR calc non Af Amer: 47 mL/min — ABNORMAL LOW (ref >60)
Glucose, Bld: 117 mg/dL — ABNORMAL HIGH (ref 70–99)
Phosphorus: 2.5 mg/dL (ref 2.5–4.6)
Potassium: 3.5 mmol/L (ref 3.5–5.1)
Sodium: 134 mmol/L — ABNORMAL LOW (ref 135–145)

## 2019-10-10 LAB — CBC
HCT: 30.3 % — ABNORMAL LOW (ref 36.0–46.0)
Hemoglobin: 10.4 g/dL — ABNORMAL LOW (ref 12.0–15.0)
MCH: 29.1 pg (ref 26.0–34.0)
MCHC: 34.3 g/dL (ref 30.0–36.0)
MCV: 84.9 fL (ref 80.0–100.0)
Platelets: 190 10*3/uL (ref 150–400)
RBC: 3.57 MIL/uL — ABNORMAL LOW (ref 3.87–5.11)
RDW: 14.7 % (ref 11.5–15.5)
WBC: 11.6 10*3/uL — ABNORMAL HIGH (ref 4.0–10.5)
nRBC: 0 % (ref 0.0–0.2)

## 2019-10-10 LAB — MAGNESIUM: Magnesium: 1.6 mg/dL — ABNORMAL LOW (ref 1.7–2.4)

## 2019-10-10 SURGERY — FIXATION, FRACTURE, INTERTROCHANTERIC, WITH INTRAMEDULLARY ROD
Anesthesia: Spinal | Laterality: Right

## 2019-10-10 MED ORDER — ACETAMINOPHEN 10 MG/ML IV SOLN
INTRAVENOUS | Status: AC
  Filled 2019-10-10: qty 100

## 2019-10-10 MED ORDER — BUPIVACAINE HCL (PF) 0.5 % IJ SOLN
INTRAMUSCULAR | Status: DC | PRN
  Administered 2019-10-10: 3 mL

## 2019-10-10 MED ORDER — EPHEDRINE SULFATE 50 MG/ML IJ SOLN
INTRAMUSCULAR | Status: AC
  Filled 2019-10-10: qty 1

## 2019-10-10 MED ORDER — PROPOFOL 10 MG/ML IV BOLUS
INTRAVENOUS | Status: AC
  Filled 2019-10-10: qty 20

## 2019-10-10 MED ORDER — HYDROCODONE-ACETAMINOPHEN 5-325 MG PO TABS
1.0000 | ORAL_TABLET | ORAL | Status: DC | PRN
  Administered 2019-10-12 – 2019-10-14 (×5): 1 via ORAL
  Filled 2019-10-10 (×5): qty 1

## 2019-10-10 MED ORDER — LACTATED RINGERS IV SOLN: INTRAVENOUS | Status: DC | PRN

## 2019-10-10 MED ORDER — CEFAZOLIN SODIUM-DEXTROSE 2-3 GM-%(50ML) IV SOLR
INTRAVENOUS | Status: DC | PRN
  Administered 2019-10-10: 2 g via INTRAVENOUS

## 2019-10-10 MED ORDER — FENTANYL CITRATE (PF) 100 MCG/2ML IJ SOLN
INTRAMUSCULAR | Status: AC
  Filled 2019-10-10: qty 2

## 2019-10-10 MED ORDER — MAGNESIUM CITRATE PO SOLN
1.0000 | Freq: Once | ORAL | Status: DC | PRN
  Filled 2019-10-10: qty 296

## 2019-10-10 MED ORDER — PROPOFOL 10 MG/ML IV BOLUS
INTRAVENOUS | Status: AC
  Filled 2019-10-10: qty 40

## 2019-10-10 MED ORDER — KETOROLAC TROMETHAMINE 15 MG/ML IJ SOLN
7.5000 mg | Freq: Four times a day (QID) | INTRAMUSCULAR | Status: AC
  Administered 2019-10-10 – 2019-10-11 (×4): 7.5 mg via INTRAVENOUS
  Filled 2019-10-10 (×4): qty 1

## 2019-10-10 MED ORDER — ALUM & MAG HYDROXIDE-SIMETH 200-200-20 MG/5ML PO SUSP: 30.0000 mL | ORAL | Status: DC | PRN

## 2019-10-10 MED ORDER — ACETAMINOPHEN 500 MG PO TABS
500.0000 mg | ORAL_TABLET | Freq: Four times a day (QID) | ORAL | Status: AC
  Administered 2019-10-10 – 2019-10-11 (×3): 500 mg via ORAL
  Filled 2019-10-10 (×3): qty 1

## 2019-10-10 MED ORDER — ONDANSETRON HCL 4 MG/2ML IJ SOLN
INTRAMUSCULAR | Status: DC | PRN
  Administered 2019-10-10: 4 mg via INTRAVENOUS

## 2019-10-10 MED ORDER — CEFAZOLIN SODIUM-DEXTROSE 1-4 GM/50ML-% IV SOLN
1.0000 g | Freq: Four times a day (QID) | INTRAVENOUS | Status: AC
  Administered 2019-10-10 (×2): 1 g via INTRAVENOUS
  Filled 2019-10-10 (×2): qty 50

## 2019-10-10 MED ORDER — SODIUM CHLORIDE 0.9 % IR SOLN
Status: DC | PRN
  Administered 2019-10-10: 1000 mL

## 2019-10-10 MED ORDER — HYDROCODONE-ACETAMINOPHEN 7.5-325 MG PO TABS: 1.0000 | ORAL_TABLET | ORAL | Status: DC | PRN

## 2019-10-10 MED ORDER — ONDANSETRON HCL 4 MG/2ML IJ SOLN: 4.0000 mg | Freq: Once | INTRAMUSCULAR | Status: DC | PRN

## 2019-10-10 MED ORDER — FENTANYL CITRATE (PF) 100 MCG/2ML IJ SOLN
25.0000 ug | INTRAMUSCULAR | Status: DC | PRN
  Administered 2019-10-10: 50 ug via INTRAVENOUS

## 2019-10-10 MED ORDER — PROPOFOL 500 MG/50ML IV EMUL
INTRAVENOUS | Status: AC
  Filled 2019-10-10: qty 100

## 2019-10-10 MED ORDER — BISACODYL 10 MG RE SUPP
10.0000 mg | Freq: Every day | RECTAL | Status: DC | PRN
  Administered 2019-10-13: 10 mg via RECTAL
  Filled 2019-10-10: qty 1

## 2019-10-10 MED ORDER — SODIUM CHLORIDE 0.9 % IV SOLN
INTRAVENOUS | Status: DC | PRN
  Administered 2019-10-10: 25 ug/min via INTRAVENOUS

## 2019-10-10 MED ORDER — PROPOFOL 500 MG/50ML IV EMUL
INTRAVENOUS | Status: AC
  Filled 2019-10-10: qty 50

## 2019-10-10 MED ORDER — MORPHINE SULFATE (PF) 2 MG/ML IV SOLN
0.5000 mg | INTRAVENOUS | Status: DC | PRN
  Administered 2019-10-10 – 2019-10-13 (×5): 1 mg via INTRAVENOUS
  Filled 2019-10-10 (×5): qty 1

## 2019-10-10 MED ORDER — PROPOFOL 500 MG/50ML IV EMUL
INTRAVENOUS | Status: DC | PRN
  Administered 2019-10-10: 50 ug/kg/min via INTRAVENOUS

## 2019-10-10 MED ORDER — METHOCARBAMOL 1000 MG/10ML IJ SOLN
500.0000 mg | Freq: Four times a day (QID) | INTRAVENOUS | Status: DC | PRN
  Filled 2019-10-10: qty 5

## 2019-10-10 MED ORDER — DEXAMETHASONE SODIUM PHOSPHATE 10 MG/ML IJ SOLN
INTRAMUSCULAR | Status: AC
  Filled 2019-10-10: qty 1

## 2019-10-10 MED ORDER — ACETAMINOPHEN 325 MG PO TABS
325.0000 mg | ORAL_TABLET | Freq: Four times a day (QID) | ORAL | Status: DC | PRN
  Administered 2019-10-11: 650 mg via ORAL
  Filled 2019-10-10: qty 2

## 2019-10-10 MED ORDER — PHENYLEPHRINE HCL (PRESSORS) 10 MG/ML IV SOLN
INTRAVENOUS | Status: AC
  Filled 2019-10-10: qty 1

## 2019-10-10 MED ORDER — SODIUM CHLORIDE 0.9 % IV SOLN
75.0000 mL/h | INTRAVENOUS | Status: DC
  Administered 2019-10-10 – 2019-10-12 (×3): 75 mL/h via INTRAVENOUS

## 2019-10-10 MED ORDER — ONDANSETRON HCL 4 MG/2ML IJ SOLN: 4.0000 mg | Freq: Four times a day (QID) | INTRAMUSCULAR | Status: DC | PRN

## 2019-10-10 MED ORDER — GLYCOPYRROLATE 0.2 MG/ML IJ SOLN
INTRAMUSCULAR | Status: AC
  Filled 2019-10-10: qty 1

## 2019-10-10 MED ORDER — SENNA 8.6 MG PO TABS
1.0000 | ORAL_TABLET | Freq: Two times a day (BID) | ORAL | Status: DC
  Administered 2019-10-10 – 2019-10-14 (×8): 8.6 mg via ORAL
  Filled 2019-10-10 (×8): qty 1

## 2019-10-10 MED ORDER — DOCUSATE SODIUM 100 MG PO CAPS
100.0000 mg | ORAL_CAPSULE | Freq: Two times a day (BID) | ORAL | Status: DC
  Administered 2019-10-10 – 2019-10-14 (×8): 100 mg via ORAL
  Filled 2019-10-10 (×7): qty 1

## 2019-10-10 MED ORDER — ONDANSETRON HCL 4 MG/2ML IJ SOLN
INTRAMUSCULAR | Status: AC
  Filled 2019-10-10: qty 2

## 2019-10-10 MED ORDER — PHENYLEPHRINE HCL (PRESSORS) 10 MG/ML IV SOLN
INTRAVENOUS | Status: DC | PRN
  Administered 2019-10-10: 100 ug via INTRAVENOUS

## 2019-10-10 MED ORDER — LIDOCAINE HCL 2 % IJ SOLN
INTRAMUSCULAR | Status: AC
  Filled 2019-10-10: qty 10

## 2019-10-10 MED ORDER — LIDOCAINE HCL (PF) 2 % IJ SOLN
INTRAMUSCULAR | Status: AC
  Filled 2019-10-10: qty 10

## 2019-10-10 MED ORDER — GLYCOPYRROLATE 0.2 MG/ML IJ SOLN
INTRAMUSCULAR | Status: DC | PRN
  Administered 2019-10-10: .2 mg via INTRAVENOUS

## 2019-10-10 MED ORDER — ONDANSETRON HCL 4 MG PO TABS: 4.0000 mg | ORAL_TABLET | Freq: Four times a day (QID) | ORAL | Status: DC | PRN

## 2019-10-10 MED ORDER — PROPOFOL 10 MG/ML IV BOLUS
INTRAVENOUS | Status: DC | PRN
  Administered 2019-10-10: 10 mg via INTRAVENOUS
  Administered 2019-10-10: 20 mg via INTRAVENOUS
  Administered 2019-10-10 (×5): 10 mg via INTRAVENOUS

## 2019-10-10 MED ORDER — METHOCARBAMOL 500 MG PO TABS
500.0000 mg | ORAL_TABLET | Freq: Four times a day (QID) | ORAL | Status: DC | PRN
  Administered 2019-10-10 – 2019-10-11 (×2): 500 mg via ORAL
  Filled 2019-10-10 (×2): qty 1

## 2019-10-10 MED ORDER — RIVAROXABAN 10 MG PO TABS
10.0000 mg | ORAL_TABLET | Freq: Every day | ORAL | Status: DC
  Administered 2019-10-11 – 2019-10-14 (×4): 10 mg via ORAL
  Filled 2019-10-10 (×4): qty 1

## 2019-10-10 SURGICAL SUPPLY — 40 items
BIT DRILL 4.3MMS DISTAL GRDTED (BIT) ×1 IMPLANT
BNDG COHESIVE 6X5 TAN STRL LF (GAUZE/BANDAGES/DRESSINGS) ×6 IMPLANT
CANISTER SUCT 1200ML W/VALVE (MISCELLANEOUS) ×3 IMPLANT
COVER WAND RF STERILE (DRAPES) ×3 IMPLANT
DRAPE 3/4 80X56 (DRAPES) ×6 IMPLANT
DRAPE SURG 17X11 SM STRL (DRAPES) ×6 IMPLANT
DRAPE U-SHAPE 47X51 STRL (DRAPES) ×3 IMPLANT
DRILL 4.3MMS DISTAL GRADUATED (BIT) ×3
DRSG OPSITE POSTOP 3X4 (GAUZE/BANDAGES/DRESSINGS) ×6 IMPLANT
DRSG OPSITE POSTOP 4X14 (GAUZE/BANDAGES/DRESSINGS) IMPLANT
DRSG OPSITE POSTOP 4X6 (GAUZE/BANDAGES/DRESSINGS) ×3 IMPLANT
DURAPREP 26ML APPLICATOR (WOUND CARE) ×6 IMPLANT
ELECT REM PT RETURN 9FT ADLT (ELECTROSURGICAL) ×3
ELECTRODE REM PT RTRN 9FT ADLT (ELECTROSURGICAL) ×1 IMPLANT
GLOVE BIOGEL PI IND STRL 9 (GLOVE) ×1 IMPLANT
GLOVE BIOGEL PI INDICATOR 9 (GLOVE) ×2
GLOVE SURG 9.0 ORTHO LTXF (GLOVE) ×6 IMPLANT
GOWN STRL REUS TWL 2XL XL LVL4 (GOWN DISPOSABLE) ×3 IMPLANT
GOWN STRL REUS W/ TWL LRG LVL3 (GOWN DISPOSABLE) ×1 IMPLANT
GOWN STRL REUS W/TWL LRG LVL3 (GOWN DISPOSABLE) ×2
GUIDEWIRE BALL NOSE 100CM (WIRE) ×3 IMPLANT
HEMOVAC 400CC 10FR (MISCELLANEOUS) IMPLANT
HFN RH 130 DEG 9MM X 360MM (Nail) ×3 IMPLANT
KIT TURNOVER CYSTO (KITS) ×3 IMPLANT
MAT ABSORB  FLUID 56X50 GRAY (MISCELLANEOUS) ×2
MAT ABSORB FLUID 56X50 GRAY (MISCELLANEOUS) ×1 IMPLANT
NS IRRIG 1000ML POUR BTL (IV SOLUTION) ×3 IMPLANT
PACK HIP COMPR (MISCELLANEOUS) ×3 IMPLANT
PAD ARMBOARD 7.5X6 YLW CONV (MISCELLANEOUS) ×3 IMPLANT
SCREW BONE CORTICAL 5.0X38 (Screw) ×3 IMPLANT
SCREW BONE CORTICAL 5.0X44 (Screw) ×3 IMPLANT
SCREW LAG 10.5MMX105MM HFN (Screw) ×3 IMPLANT
STAPLER SKIN PROX 35W (STAPLE) ×3 IMPLANT
SUCTION FRAZIER HANDLE 10FR (MISCELLANEOUS)
SUCTION TUBE FRAZIER 10FR DISP (MISCELLANEOUS) IMPLANT
SUT VIC AB 0 CT1 36 (SUTURE) ×6 IMPLANT
SUT VIC AB 2-0 CT1 27 (SUTURE) ×2
SUT VIC AB 2-0 CT1 TAPERPNT 27 (SUTURE) ×1 IMPLANT
SUT VICRYL 0 AB UR-6 (SUTURE) ×3 IMPLANT
SYR 30ML LL (SYRINGE) ×3 IMPLANT

## 2019-10-10 NOTE — Anesthesia Procedure Notes (Signed)
Spinal  Patient location during procedure: OR Start time: 10/10/2019 8:49 AM End time: 10/10/2019 8:55 AM Staffing Performed: anesthesiologist  Anesthesiologist: Martha Clan, MD Preanesthetic Checklist Completed: patient identified, IV checked, site marked, risks and benefits discussed, surgical consent, monitors and equipment checked, pre-op evaluation and timeout performed Spinal Block Patient position: sitting Prep: DuraPrep Patient monitoring: heart rate, cardiac monitor, continuous pulse ox and blood pressure Approach: midline Location: L3-4 Injection technique: single-shot Needle Needle type: Sprotte  Needle gauge: 24 G Needle length: 9 cm Assessment Sensory level: T8 Additional Notes IV functioning, monitors applied to pt. Expiration date of kit checked and confirmed to be in date. Sterile prep and drape, hand hygiene and sterile gloved used. Pt was positioned and spine was prepped in sterile fashion. Skin was anesthetized with lidocaine. Free flow of clear CSF obtained prior to injecting local anesthetic into CSF x 1 attempt. Spinal needle aspirated freely following injection. Needle was carefully withdrawn, and pt tolerated procedure well. Loss of motor and sensory on exam post injection.

## 2019-10-10 NOTE — Progress Notes (Signed)
  Subjective:  Patient seen in the preop area with her son at the bedside.  Patient is confused due to baseline dementia.  Objective:   VITALS:   Vitals:   10/08/19 2345 10/09/19 0829 10/09/19 1550 10/09/19 2322  BP: (!) 147/75 (!) 155/70 (!) 121/55 (!) 152/63  Pulse: 74 78 77 84  Resp: 19 15 16 19   Temp: 98.3 F (36.8 C) 98.3 F (36.8 C) 99 F (37.2 C) 99.5 F (37.5 C)  TempSrc: Oral Oral Oral Oral  SpO2: 93% 92% (!) 88% 93%  Weight:        PHYSICAL EXAM: Right lower extremity: Lower extremity shortened and externally rotated.  Skin is intact.  Pedal pulses are palpable.  Skin remains intact.  The right hip was marked with a surgical marker.   LABS  Results for orders placed or performed during the hospital encounter of 10/06/19 (from the past 24 hour(s))  Renal function panel     Status: Abnormal   Collection Time: 10/10/19  4:06 AM  Result Value Ref Range   Sodium 134 (L) 135 - 145 mmol/L   Potassium 3.5 3.5 - 5.1 mmol/L   Chloride 99 98 - 111 mmol/L   CO2 25 22 - 32 mmol/L   Glucose, Bld 117 (H) 70 - 99 mg/dL   BUN 24 (H) 8 - 23 mg/dL   Creatinine, Ser 10/12/19 (H) 0.44 - 1.00 mg/dL   Calcium 8.3 (L) 8.9 - 10.3 mg/dL   Phosphorus 2.5 2.5 - 4.6 mg/dL   Albumin 3.1 (L) 3.5 - 5.0 g/dL   GFR calc non Af Amer 47 (L) >60 mL/min   GFR calc Af Amer 54 (L) >60 mL/min   Anion gap 10 5 - 15  Magnesium     Status: Abnormal   Collection Time: 10/10/19  4:06 AM  Result Value Ref Range   Magnesium 1.6 (L) 1.7 - 2.4 mg/dL  CBC     Status: Abnormal   Collection Time: 10/10/19  4:06 AM  Result Value Ref Range   WBC 11.6 (H) 4.0 - 10.5 K/uL   RBC 3.57 (L) 3.87 - 5.11 MIL/uL   Hemoglobin 10.4 (L) 12.0 - 15.0 g/dL   HCT 10/12/19 (L) 36 - 46 %   MCV 84.9 80.0 - 100.0 fL   MCH 29.1 26.0 - 34.0 pg   MCHC 34.3 30.0 - 36.0 g/dL   RDW 81.4 48.1 - 85.6 %   Platelets 190 150 - 400 K/uL   nRBC 0.0 0.0 - 0.2 %    No results found.  Assessment/Plan: Day of Surgery   Active  Problems:   Hip fracture (HCC)   Fall   DNR (do not resuscitate)   Goals of care, counseling/discussion   Advanced care planning/counseling discussion   Palliative care by specialist  Patient will undergo intramedullary fixation of her right intertrochanteric hip fracture this morning.  I reviewed the details of the operation as well as the postoperative course as well as the risks and benefits of the surgery with the patient's son who is in agreement with the surgical plan.    31.4 , MD 10/10/2019, 8:42 AM

## 2019-10-10 NOTE — Transfer of Care (Signed)
Immediate Anesthesia Transfer of Care Note  Patient: Christina Wise  Procedure(s) Performed: INTRAMEDULLARY (IM) NAIL INTERTROCHANTRIC (Right )  Patient Location: PACU  Anesthesia Type:General  Level of Consciousness: awake and drowsy  Airway & Oxygen Therapy: Patient Spontanous Breathing and Patient connected to face mask oxygen  Post-op Assessment: Report given to RN and Post -op Vital signs reviewed and stable  Post vital signs: Reviewed and stable  Last Vitals:  Vitals Value Taken Time  BP 104/72 10/10/19 1030  Temp    Pulse 82 10/10/19 1030  Resp 20 10/10/19 1030  SpO2 99 % 10/10/19 1030  Vitals shown include unvalidated device data.  Last Pain:  Vitals:   10/10/19 0739  TempSrc:   PainSc: Asleep         Complications: No complications documented.

## 2019-10-10 NOTE — Progress Notes (Addendum)
PROGRESS NOTE  Christina Wise IEP:[id number] DOB: [birth year]   PCP: Patient, No Pcp Per  Patient is from: ALF.  Completely independent with ADLs at baseline per daughter.  DOA: 10/06/2019 LOS: 4  Brief Narrative / Interim history: 86 year old female with history of advanced dementia, CKD-3B, HTN, hypothyroidism, anxiety/depression, superficial DVT on low-dose Xarelto brought to ED from ALF after unwitnessed fall and admitted for right femoral intertrochanteric fracture.  CT head and cervical spine without acute finding.  Surgery delayed due to clearance of Xarelto.  Patient underwent intramedullary fixation, today, 8/29.  Subjective: Patient seen prior to surgery.  No complaints, confused, resting comfortably  Objective: Vitals:   10/10/19 1100 10/10/19 1115 10/10/19 1150 10/10/19 1349  BP: 121/61 116/84 136/78 139/85  Pulse: 84 79  85  Resp: (!) 21 16 17 17   Temp:   (!) 97.3 F (36.3 C) (!) 97.4 F (36.3 C)  TempSrc:   Oral Oral  SpO2: 92% 94% 100% 93%  Weight:        Intake/Output Summary (Last 24 hours) at 10/10/2019 1543 Last data filed at 10/10/2019 1021 Gross per 24 hour  Intake 700 ml  Output 2100 ml  Net -1400 ml   Filed Weights   10/06/19 2015  Weight: 68 kg    Examination:  GENERAL: No acute distress, resting comfortably HEENT: Normocephalic, atraumatic, mucous membranes appear dry RESP: Clear to auscultation bilaterally CVS: Regular rate and rhythm, S1-S2 ABD/GI/GU: Soft, nondistended, positive bowel sounds MSK/EXT: No clubbing or cyanosis, trace pitting edema PSYCH: Underlying dementia, no evidence of acute psychosis Procedures:  None  Microbiology summarized: COVID-19 PCR negative.  Assessment & Plan: Unwitnessed fall at nursing facility Acute right femoral intratrochanteric fracture Status post surgical repair.  Monitor for acute blood loss anemia.  AKI: Likely secondary to dehydration.  With fluids, creatinine down from 1.2 to 1.0.   Baseline around 0.7.  Continue to monitor.  Essential hypertension: BP within fair range. -Continue amlodipine to 10 mg daily -Pain control.  Hyperglycemia without diagnosis of diabetes: A1c 5.8%.  Anxiety/depression: Stable -Continue home medications  Hypomagnesemia: Replenished. -Monitor replace as appropriate  Dementia without behavioral disturbance -Reorientation and delirium precautions  Severe protein calorie malnutrition: Nutrition consult:  Hypothyroidism -Continue home Synthroid  History of superficial DVT on low-dose Xarelto -Xarelto on hold pending surgery.  Resume when okay by orthopedics  GOC/DNR/DNI-appropriate. -Palliative care consulted.  Appreciate input.  Body mass index is 25.75 kg/m.         DVT prophylaxis:  rivaroxaban (XARELTO) tablet 10 mg Start: 10/11/19 1000 SCDs Start: 10/10/19 1148 Place TED hose Start: 10/10/19 1148  Code Status: DNR/DNI Family Communication: Attempted to call patient's son for update but no answer.  Status is: Inpatient  Remains inpatient appropriate because:IV treatments appropriate due to intensity of illness or inability to take PO and Inpatient level of care appropriate due to severity of illness.  Surgical repair of right hip on 10/10/2019, postop recovery   Dispo: The patient is from: ALF              Anticipated d/c is to: SNF              Anticipated d/c date is: 8/31              Patient currently is not medically stable to d/c.       Consultants:  Orthopedic surgery Palliative medicine   Sch Meds:  Scheduled Meds: . acetaminophen  500 mg Oral Q6H  . amLODipine  10 mg Oral Daily  . Chlorhexidine Gluconate Cloth  6 each Topical Daily  . divalproex  125 mg Oral Q12H  . docusate sodium  100 mg Oral BID  . feeding supplement (ENSURE ENLIVE)  237 mL Oral BID BM  . fentaNYL      . ketorolac  7.5 mg Intravenous Q6H  . levothyroxine  100 mcg Oral Daily  . [START ON 10/11/2019] rivaroxaban  10 mg  Oral Daily  . senna  1 tablet Oral BID  . sertraline  25 mg Oral Daily   Continuous Infusions: . sodium chloride 75 mL/hr (10/10/19 1353)  .  ceFAZolin (ANCEF) IV 1 g (10/10/19 1453)  . methocarbamol (ROBAXIN) IV     PRN Meds:.[START ON 10/11/2019] acetaminophen, ALPRAZolam, alum & mag hydroxide-simeth, bisacodyl, HYDROcodone-acetaminophen, HYDROcodone-acetaminophen, magnesium citrate, methocarbamol **OR** methocarbamol (ROBAXIN) IV, morphine injection, ondansetron **OR** ondansetron (ZOFRAN) IV, polyethylene glycol  Antimicrobials: Anti-infectives (From admission, onward)   Start     Dose/Rate Route Frequency Ordered Stop   10/10/19 1500  ceFAZolin (ANCEF) IVPB 1 g/50 mL premix        1 g 100 mL/hr over 30 Minutes Intravenous Every 6 hours 10/10/19 1147 10/11/19 0259   10/10/19 0952  gentamicin 80 mg in 0.9% sodium chloride 250 mL irrigation  Status:  Discontinued          As needed 10/10/19 0952 10/10/19 1024       I have personally reviewed the following labs and images: CBC: Recent Labs  Lab 10/06/19 2012 10/07/19 0513 10/08/19 0532 10/10/19 0406  WBC 9.6 10.4 11.6* 11.6*  NEUTROABS 7.7  --   --   --   HGB 13.0 11.3* 11.0* 10.4*  HCT 38.8 33.4* 32.4* 30.3*  MCV 85.5 84.8 85.3 84.9  PLT 171 174 182 190   BMP &GFR Recent Labs  Lab 10/06/19 2012 10/07/19 0513 10/08/19 0532 10/10/19 0406  NA 138 139 136 134*  K 4.2 4.5 3.9 3.5  CL 102 103 99 99  CO2 24 26 26 25   GLUCOSE 163* 150* 111* 117*  BUN 25* 21 23 24*  CREATININE 1.21* 0.99 1.00 1.02*  CALCIUM 9.2 8.7* 8.6* 8.3*  MG  --   --  1.6* 1.6*  PHOS  --   --  2.6 2.5   CrCl cannot be calculated (Unknown ideal weight.). Liver & Pancreas: Recent Labs  Lab 10/08/19 0532 10/10/19 0406  ALBUMIN 3.4* 3.1*   No results for input(s): LIPASE, AMYLASE in the last 168 hours. No results for input(s): AMMONIA in the last 168 hours. Diabetic: Recent Labs    10/08/19 0532  HGBA1C 5.8*   No results for input(s):  GLUCAP in the last 168 hours. Cardiac Enzymes: Recent Labs  Lab 10/07/19 1224  CKTOTAL 159   No results for input(s): PROBNP in the last 8760 hours. Coagulation Profile: No results for input(s): INR, PROTIME in the last 168 hours. Thyroid Function Tests: No results for input(s): TSH, T4TOTAL, FREET4, T3FREE, THYROIDAB in the last 72 hours. Lipid Profile: No results for input(s): CHOL, HDL, LDLCALC, TRIG, CHOLHDL, LDLDIRECT in the last 72 hours. Anemia Panel: No results for input(s): VITAMINB12, FOLATE, FERRITIN, TIBC, IRON, RETICCTPCT in the last 72 hours. Urine analysis:    Component Value Date/Time   COLORURINE YELLOW (A) 05/01/2018 1616   APPEARANCEUR CLEAR (A) 05/01/2018 1616   LABSPEC 1.010 05/01/2018 1616   PHURINE 7.0 05/01/2018 1616   GLUCOSEU NEGATIVE 05/01/2018 1616   HGBUR NEGATIVE 05/01/2018 1616   BILIRUBINUR  NEGATIVE 05/01/2018 1616   KETONESUR 5 (A) 05/01/2018 1616   PROTEINUR NEGATIVE 05/01/2018 1616   NITRITE NEGATIVE 05/01/2018 1616   LEUKOCYTESUR NEGATIVE 05/01/2018 1616   Sepsis Labs: Invalid input(s): PROCALCITONIN, LACTICIDVEN  Microbiology: Recent Results (from the past 240 hour(s))  SARS Coronavirus 2 by RT PCR (hospital order, performed in [facility] hospital lab) Nasopharyngeal Nasopharyngeal Swab     Status: None   Collection Time: 10/06/19  9:57 PM   Specimen: Nasopharyngeal Swab  Result Value Ref Range Status   SARS Coronavirus 2 NEGATIVE NEGATIVE Final    Comment: (NOTE) SARS-CoV-2 target nucleic acids are NOT DETECTED.  The SARS-CoV-2 RNA is generally detectable in upper and lower respiratory specimens during the acute phase of infection. The lowest concentration of SARS-CoV-2 viral copies this assay can detect is 250 copies / mL. A negative result does not preclude SARS-CoV-2 infection and should not be used as the sole basis for treatment or other patient management decisions.  A negative result may occur with improper specimen  collection / handling, submission of specimen other than nasopharyngeal swab, presence of viral mutation(s) within the areas targeted by this assay, and inadequate number of viral copies (<250 copies / mL). A negative result must be combined with clinical observations, patient history, and epidemiological information.  Fact Sheet for Patients:   BoilerBrush.com.cy  Fact Sheet for Healthcare Providers: https://pope.com/  This test is not yet approved or  cleared by the [place] FDA and has been authorized for detection and/or diagnosis of SARS-CoV-2 by FDA under an Emergency Use Authorization (EUA).  This EUA will remain in effect (meaning this test can be used) for the duration of the COVID-19 declaration under Section 564(b)(1) of the Act, 21 U.S.C. section 360bbb-3(b)(1), unless the authorization is terminated or revoked sooner.  Performed at [facility], [address] [place], [place] [address]   MRSA PCR Screening     Status: None   Collection Time: 10/07/19  7:00 AM   Specimen: Nasopharyngeal  Result Value Ref Range Status   MRSA by PCR NEGATIVE NEGATIVE Final    Comment:        The GeneXpert MRSA Assay (FDA approved for NASAL specimens only), is one component of a comprehensive MRSA colonization surveillance program. It is not intended to diagnose MRSA infection nor to guide or monitor treatment for MRSA infections. Performed at [facility], [address] [place], [place] [address]     Radiology Studies: DG HIP OPERATIVE UNILAT W OR W/O PELVIS RIGHT  Result Date: 10/10/2019 CLINICAL DATA:  RIGHT hip fracture EXAM: OPERATIVE RIGHT HIP (WITH PELVIS IF PERFORMED) for VIEWS TECHNIQUE: Fluoroscopic spot image(s) were submitted for interpretation post-operatively. COMPARISON:  10/06/2019 FINDINGS: Osseous demineralization. IM nail with compression screw placed across reduced intertrochanteric  fracture of RIGHT femur. Two distal locking screws noted. No additional fracture, dislocation or bone destruction. IMPRESSION: Post ORIF of intertrochanteric fracture RIGHT femur. Electronically Signed   By: Ulyses Southward M.D.   On: 10/10/2019 12:06   DG FEMUR, MIN 2 VIEWS RIGHT  Result Date: 10/10/2019 CLINICAL DATA:  Post RIGHT hip surgery EXAM: RIGHT FEMUR 2 VIEWS COMPARISON:  Portable exam 1116 hours compared to intraoperative images of 10/10/2019 FINDINGS: IM nail with compression screw identified at proximal RIGHT femur post ORIF of an intertrochanteric fracture. Two distal locking screws. Osseous demineralization. No additional fracture or dislocation. Degenerative changes and question CPPD RIGHT knee. IMPRESSION: Post ORIF of intertrochanteric fracture RIGHT femur. Electronically Signed   By:  Ulyses SouthwardMark  Boles M.D.   On: 10/10/2019 12:05      Taye T. Gonfa [facility]  If 7PM-7AM, please contact night-coverage [email or web address] 10/10/2019, 3:43 PM

## 2019-10-10 NOTE — Progress Notes (Addendum)
No order for NPO found this morning however per night nurse patient has been NPO since midnight.  This nurse put in order so no breakfast tray would be delivered

## 2019-10-10 NOTE — Progress Notes (Signed)
15 minute call to floor. 

## 2019-10-10 NOTE — Op Note (Signed)
10/10/2019  2:10 PM  PATIENT:  Christina Wise    PRE-OPERATIVE DIAGNOSIS:  Right intertrochanteric Hip Fracture  POST-OPERATIVE DIAGNOSIS:  Same  PROCEDURE:  INTRAMEDULLARY FIXATION FOR RIGHT INTERTROCHANTERIC HIP FRACTURE  SURGEON:  Juanell Fairly, MD  ANESTHESIA:   Spinal   EBL:  50 cc  IMPLANT:  ZIMMER BIOMET AFFIXUS NAIL 75mm x with a 105 mm lag screw and distal interlocking screws 91mm  and 44 mm in length.  PREOPERATIVE INDICATIONS:  Christina Wise is a  86 y.o. female with dementia who sustained a Right intertrochanteric Hip Fracture after a fall.   Intramedullary fixation was recommended treatment for this fracture to the patient's son.     The risks, benefits and alternatives were discussed with the patient and their family.  The risks include but are not limited to infection, bleeding requiring blood transfusion, nerve or blood vessel injury, malunion, nonunion, hardware prominence, hardware failure, leg length discrepancy or change in lower extremity rotation and need for further surgery including hardware removal with conversion to a total hip arthroplasty. Medical risks include but are not limited to DVT and pulmonary embolism, myocardial infarction, stroke, pneumonia, respiratory failure and death. The patient's son understood these risks and wished to proceed with surgery.  The patient presented on xarelto and anesthesia department had recommended waiting 72 hours before a spinal anesthetic could be given.  OPERATIVE PROCEDURE:  The patient was brought to the operating room and placed in the supine position on the fracture table. The patient received spinal anesthesia.  A closed reduction was performed under C-arm guidance.  The fracture reduction was confirmed on both AP and lateral views. After adequate reduction was achieved, a time out was performed to verify the patient's name, date of birth, medical record number, correct site of surgery correct  procedure to be performed. The timeout was also used to verify the patient received antibiotics and all appropriate instruments, implants and radiographic studies were available in the room. Once all in attendance were in agreement, the case began. The patient was prepped and draped in a sterile fashion. The patient received preoperative antibiotics with 2 grams of ancef IV.  An incision was made proximal to the greater trochanter in line with the femur. A guidewire was placed over the tip of the greater trochanter and advanced by drill into the proximal femur to the level of the lesser trochanter.  Confirmation of the drill pin position was made on AP and lateral C-arm images.  The threaded guidepin was then overdrilled with the proximal femoral entry reamer.  A ball-tipped guidewire was then advanced down the intramedullary canal, across the fracture, and down the femoral shaft to the knee.  The ball tip guidewire's position was confirmed at both the knee and hip via C-arm imaging. A depth gauge was used to measure the length of the long nail to be used. It was measured to be 360 mm. The actual nail was then inserted into the proximal femur, across the fracture site and down the femoral shaft. Its position was confirmed on AP and lateral C-arm images.  The ball tip guidewire was removed.  Once the nail was completely seated, the drill guide for the lag screw was placed through the guide arm for the Affixus nail. A guidepin was then placed through this drill guide and advanced through the lateral cortex of the femur, across the fracture site and into the femoral head achieving a tip apex distance of less than 25 mm. The  length of the drill pin was measured to , and then the drill for the lag screw was advanced through the lateral cortex, across the fracture site and up into the femoral head to the depth of the lag screw.  The 105 mm lag screw was then advanced by hand into position across the fracture site  into the femoral head. Its final position was confirmed on AP and lateral C-arm images. Compression was applied as traction was carefully released. The set screw in the top of the intramedullary rod was tightened by hand using a screwdriver.   The attention was then turned to placement of the distal interlocking screws. A perfect circle technique was used. 2 small stab incisions were made over the distal interlocking screw holes.  A free hand technique was used to drill both distal interlocking screws. The depth of the screw holes was measured with a depth gauge. The 57mm and 65mm screws were then advanced into position and tightened by hand. Final C-arm images of the entire intramedullary construct were taken in both the AP and lateral planes.   The wounds were irrigated copiously and closed with 0 Vicryl for closure of the deep fascia and 2-0 Vicryl for subcutaneous closure. The skin was approximated with staples. A dry sterile dressing was applied. I was scrubbed and present the entire case and all sharp, sponge and instrument counts were correct at the conclusion of the case. Patient was transferred to a hospital bed and brought to PACU in stable condition.  I spoke to the patient's son by phone post-op to let him know the surgery had been performed without complication and the patient was stable in the recovery room.    Kathreen Devoid, MD

## 2019-10-10 NOTE — Progress Notes (Signed)
Patient pulling at her iv, reaching for the iv bag, restless and pulling her clothes off.  Unable to console patient. Wrapped iv in left arm and will give medication as ordered.

## 2019-10-11 ENCOUNTER — Encounter: Payer: Self-pay | Admitting: Orthopedic Surgery

## 2019-10-11 DIAGNOSIS — W19XXXD Unspecified fall, subsequent encounter: Secondary | ICD-10-CM

## 2019-10-11 LAB — BASIC METABOLIC PANEL
Anion gap: 3 — ABNORMAL LOW (ref 5–15)
BUN: 34 mg/dL — ABNORMAL HIGH (ref 8–23)
CO2: 29 mmol/L (ref 22–32)
Calcium: 7.6 mg/dL — ABNORMAL LOW (ref 8.9–10.3)
Chloride: 103 mmol/L (ref 98–111)
Creatinine, Ser: 1.22 mg/dL — ABNORMAL HIGH (ref 0.44–1.00)
GFR calc Af Amer: 44 mL/min — ABNORMAL LOW (ref >60)
GFR calc non Af Amer: 38 mL/min — ABNORMAL LOW (ref >60)
Glucose, Bld: 117 mg/dL — ABNORMAL HIGH (ref 70–99)
Potassium: 4.8 mmol/L (ref 3.5–5.1)
Sodium: 135 mmol/L (ref 135–145)

## 2019-10-11 LAB — CBC
HCT: 23.7 % — ABNORMAL LOW (ref 36.0–46.0)
Hemoglobin: 7.7 g/dL — ABNORMAL LOW (ref 12.0–15.0)
MCH: 28.7 pg (ref 26.0–34.0)
MCHC: 32.5 g/dL (ref 30.0–36.0)
MCV: 88.4 fL (ref 80.0–100.0)
Platelets: 178 10*3/uL (ref 150–400)
RBC: 2.68 MIL/uL — ABNORMAL LOW (ref 3.87–5.11)
RDW: 14.5 % (ref 11.5–15.5)
WBC: 10.4 10*3/uL (ref 4.0–10.5)
nRBC: 0 % (ref 0.0–0.2)

## 2019-10-11 LAB — VITAMIN D 25 HYDROXY (VIT D DEFICIENCY, FRACTURES): Vit D, 25-Hydroxy: 15.84 ng/mL — ABNORMAL LOW (ref 30–100)

## 2019-10-11 MED ORDER — ENSURE ENLIVE PO LIQD
237.0000 mL | Freq: Three times a day (TID) | ORAL | Status: DC
  Administered 2019-10-12 – 2019-10-14 (×6): 237 mL via ORAL

## 2019-10-11 NOTE — Anesthesia Postprocedure Evaluation (Signed)
Anesthesia Post Note  Patient: Christina Wise  Procedure(s) Performed: INTRAMEDULLARY (IM) NAIL INTERTROCHANTRIC (Right )  Patient location during evaluation: Nursing Unit Anesthesia Type: Spinal Level of consciousness: oriented and awake and alert Pain management: pain level controlled Vital Signs Assessment: post-procedure vital signs reviewed and stable Respiratory status: spontaneous breathing, respiratory function stable and patient connected to nasal cannula oxygen Cardiovascular status: blood pressure returned to baseline and stable Postop Assessment: no headache, no backache and no apparent nausea or vomiting Anesthetic complications: no   No complications documented.   Last Vitals:  Vitals:   10/11/19 0049 10/11/19 0445  BP: 125/64 (!) 124/57  Pulse: 80 79  Resp: 16 15  Temp: 37 C 36.8 C  SpO2: 93% 92%    Last Pain:  Vitals:   10/11/19 0445  TempSrc: Oral  PainSc:                  [name]

## 2019-10-11 NOTE — Progress Notes (Signed)
Initial Nutrition Assessment  DOCUMENTATION CODES:   Severe malnutrition in context of social or environmental circumstances  INTERVENTION:  Provide Ensure Enlive po TID, each supplement provides 350 kcal and 20 grams of protein.   Provide Magic cup BID with lunch and dinner, each supplement provides 290 kcal and 9 grams of protein.  Provide daily MVI.  NUTRITION DIAGNOSIS:   Severe Malnutrition related to social / environmental circumstances (advanced age, dementia, suspected inadequate oral intake) as evidenced by severe fat depletion, severe muscle depletion.  GOAL:   Patient will meet greater than or equal to 90% of their needs  MONITOR:   PO intake, Supplement acceptance, Labs, Weight trends, I & O's, Skin  REASON FOR ASSESSMENT:   Consult Assessment of nutrition requirement/status  ASSESSMENT:   86 year old female with PMHx of dementia, hearing impairment, HTN admitted after unwitnessed fall at nursing facility with acute right femoral intertrochanteric fracture s/p IM fixation on 8/29.   Met with patient in room. She is from [facility]. Patient unable to hear RD very well or provide any history. She had refused her lunch tray. RN brought her Magic Cup and Ensure and she was enjoying both of these. She had finished about 50% of her Magic Cup so far and drank about 25% of Ensure while RD in room. Patient's son also came into room to visit patient. He reports he is unsure of patient's usual intake but on a recent visit patient had eaten only around 25% of her meal. He reports patient was not on fine chop diet at facility. Reviewed paper chart from facility and patient was on a regular texture diet with thin liquids. Patient was ordered for dysphagia 2 diet with thin liquids on 8/26 following SLP evaluation. Patient eating 10-25% of meals per chart.  Limited weight history available in chart. Patient was documented to be 58.1 kg on both 03/30/2018 and 05/01/2018. Unsure if  current weight of 68 kg (150 lbs) documented on 8/25 is accurate. Patient was sitting in chair so unable to obtain bed scale weight.  Medications reviewed and include: Colace 100 mg BID, levothyroxine, senna 1 tablet BID, NS at 75 mL/hr.  Labs reviewed: BUN 34, Creatinine 1.22.  NUTRITION - FOCUSED PHYSICAL EXAM:    Most Recent Value  Orbital Region Severe depletion  Upper Arm Region Severe depletion  Thoracic and Lumbar Region Moderate depletion  Buccal Region Severe depletion  Temple Region Severe depletion  Clavicle Bone Region Severe depletion  Clavicle and Acromion Bone Region Severe depletion  Scapular Bone Region Unable to assess  Dorsal Hand Severe depletion  Patellar Region Unable to assess  Anterior Thigh Region Unable to assess  Posterior Calf Region Unable to assess  Edema (RD Assessment) Unable to assess  Hair Reviewed  Eyes Reviewed  Mouth Reviewed  Skin Reviewed  Nails Reviewed     Diet Order:   Diet Order            DIET DYS 2 Room service appropriate? Yes; Fluid consistency: Thin  Diet effective now                EDUCATION NEEDS:   No education needs have been identified at this time  Skin:  Skin Assessment: Skin Integrity Issues: Skin Integrity Issues:: Incisions Incisions: closed incision right hip  Last BM:  10/09/2019 - small type 7  Height:   Ht Readings from Last 1 Encounters:  10/06/19 '5\' 4"'  (1.626 m)   Weight:   Wt Readings from  Last 1 Encounters:  10/06/19 68 kg   BMI:  Body mass index is 25.75 kg/m.  Estimated Nutritional Needs:   Kcal:  1700-1900  Protein:  85-95 grams  Fluid:  1.7 L/day  Jacklynn Barnacle, MS, RD, LDN Pager number available on Amion

## 2019-10-11 NOTE — Progress Notes (Signed)
Subjective:  POD #1 s/p intramedullary fixation for left intertrochanteric hip fracture.   Patient has dementia and is unable to provide an accurate history.  She is sitting up out of bed to a chair.  Patient complains she is cold.  Objective:   VITALS:   Vitals:   10/10/19 2048 10/11/19 0049 10/11/19 0445 10/11/19 0748  BP: (!) 121/56 125/64 (!) 124/57 116/62  Pulse: 71 80 79 71  Resp: 16 16 15 17   Temp: 98.4 F (36.9 C) 98.6 F (37 C) 98.2 F (36.8 C) 98.5 F (36.9 C)  TempSrc: Oral Oral Oral Oral  SpO2: 91% 93% 92% 93%  Weight:      Height:        PHYSICAL EXAM: Left lower extremity Neurovascular intact Sensation intact distally Intact pulses distally Dorsiflexion/Plantar flexion intact Incision: scant drainage No cellulitis present Compartment soft  LABS  Results for orders placed or performed during the hospital encounter of 10/06/19 (from the past 24 hour(s))  CBC     Status: Abnormal   Collection Time: 10/11/19  5:08 AM  Result Value Ref Range   WBC 10.4 4.0 - 10.5 K/uL   RBC 2.68 (L) 3.87 - 5.11 MIL/uL   Hemoglobin 7.7 (L) 12.0 - 15.0 g/dL   HCT 10/13/19 (L) 36 - 46 %   MCV 88.4 80.0 - 100.0 fL   MCH 28.7 26.0 - 34.0 pg   MCHC 32.5 30.0 - 36.0 g/dL   RDW 35.0 09.3 - 81.8 %   Platelets 178 150 - 400 K/uL   nRBC 0.0 0.0 - 0.2 %  Basic metabolic panel     Status: Abnormal   Collection Time: 10/11/19  5:08 AM  Result Value Ref Range   Sodium 135 135 - 145 mmol/L   Potassium 4.8 3.5 - 5.1 mmol/L   Chloride 103 98 - 111 mmol/L   CO2 29 22 - 32 mmol/L   Glucose, Bld 117 (H) 70 - 99 mg/dL   BUN 34 (H) 8 - 23 mg/dL   Creatinine, Ser 10/13/19 (H) 0.44 - 1.00 mg/dL   Calcium 7.6 (L) 8.9 - 10.3 mg/dL   GFR calc non Af Amer 38 (L) >60 mL/min   GFR calc Af Amer 44 (L) >60 mL/min   Anion gap 3 (L) 5 - 15    DG HIP OPERATIVE UNILAT W OR W/O PELVIS RIGHT  Result Date: 10/10/2019 CLINICAL DATA:  RIGHT hip fracture EXAM: OPERATIVE RIGHT HIP (WITH PELVIS IF PERFORMED)  for VIEWS TECHNIQUE: Fluoroscopic spot image(s) were submitted for interpretation post-operatively. COMPARISON:  10/06/2019 FINDINGS: Osseous demineralization. IM nail with compression screw placed across reduced intertrochanteric fracture of RIGHT femur. Two distal locking screws noted. No additional fracture, dislocation or bone destruction. IMPRESSION: Post ORIF of intertrochanteric fracture RIGHT femur. Electronically Signed   By: 10/08/2019 M.D.   On: 10/10/2019 12:06   DG FEMUR, MIN 2 VIEWS RIGHT  Result Date: 10/10/2019 CLINICAL DATA:  Post RIGHT hip surgery EXAM: RIGHT FEMUR 2 VIEWS COMPARISON:  Portable exam 1116 hours compared to intraoperative images of 10/10/2019 FINDINGS: IM nail with compression screw identified at proximal RIGHT femur post ORIF of an intertrochanteric fracture. Two distal locking screws. Osseous demineralization. No additional fracture or dislocation. Degenerative changes and question CPPD RIGHT knee. IMPRESSION: Post ORIF of intertrochanteric fracture RIGHT femur. Electronically Signed   By: 10/12/2019 M.D.   On: 10/10/2019 12:05    Assessment/Plan: 1 Day Post-Op   Active Problems:   Protein-calorie malnutrition, severe  Hip fracture (HCC)   Fall   DNR (do not resuscitate)   Goals of care, counseling/discussion   Advanced care planning/counseling discussion   Palliative care by specialist  Patient is stable postop.  Continue physical therapy as much as the patient can participate.  Patient is weightbearing as tolerated left lower extremity.  She will start Lovenox today for DVT prophylaxis.  Patient will need a skilled nursing bed at [facility] upon discharge where she currently resides.    Juanell Fairly , MD 10/11/2019, 1:01 PM

## 2019-10-11 NOTE — Care Management Important Message (Signed)
Important Message  Patient Details  Name: Christina Wise MRN: [id number] Date of Birth: 10-12-1921   Medicare Important Message Given:  Yes     [name] 10/11/2019, 1:13 PM

## 2019-10-11 NOTE — Progress Notes (Signed)
Physical Therapy Treatment Patient Details Name: Christina Wise MRN: [id number] DOB: [birth year] Today's Date: 10/11/2019    History of Present Illness Pt is a 86 y.o. female presenting to hospital 8/25 with R hip pain s/p fall.  Imaging showing R femoral intertrochanteric fx; age indeterminate R inferior pubic ramus fx.  Delayed surgical repair d/t pt fully anticoagulated.  Pt s/p IM fixation for R intertrochanteric hip fx 8/29.  PMH includes hearing impairment, htn, mild dementia, thyroid disease, post-phlebitic syndrome.    PT Comments    Pt resting in recliner upon PT arrival; pt's son present.  Pt's O2 sats 82-83% on 3 L O2 via nasal cannula at rest; therapist increased O2 to 4 L and pt's O2 sats increased to 91-92% (nurse notified and came to check on pt).  Pt requiring min to mod assist x2 to transfer with RW recliner to bed and 2 assist to reposition in bed.  Pt's O2 sats 90% on 4 L O2 via nasal cannula resting in bed end of session (nurse notified of pt's O2 sats and supplemental O2 levels).  Will continue to focus on strengthening and progressive functional mobility per pt tolerance.    Follow Up Recommendations  SNF     Equipment Recommendations  Rolling walker with 5" wheels;3in1 (PT)    Recommendations for Other Services OT consult     Precautions / Restrictions Precautions Precautions: Fall Precaution Comments: Aspiration Restrictions Weight Bearing Restrictions: Yes RLE Weight Bearing: Weight bearing as tolerated    Mobility  Bed Mobility Overal bed mobility: Needs Assistance Bed Mobility: Sit to Supine     Sit to supine: Min assist;Mod assist;+2 for physical assistance;HOB elevated   General bed mobility comments: assist for R>L LE and trunk; visual and tactile cues for technique  Transfers Overall transfer level: Needs assistance Equipment used: Rolling walker (2 wheeled) Transfers: Sit to/from Stand Sit to Stand: Min assist;Mod assist;+2 physical  assistance         General transfer comment: visual and tactile cues for UE/LE placement and overall technique (stand step turn recliner to bed)  Ambulation/Gait     General Gait Details: Deferred d/t pt's difficulty taking steps to bed with 2 assist   Stairs             Wheelchair Mobility    Modified Rankin (Stroke Patients Only)       Balance Overall balance assessment: Needs assistance Sitting-balance support: No upper extremity supported;Feet supported Sitting balance-Leahy Scale: Fair Sitting balance - Comments: steady static sitting edge of bed   Standing balance support: Single extremity supported Standing balance-Leahy Scale: Poor Standing balance comment: pt requiring at least single UE support for static standing balance                            Cognition Arousal/Alertness: Awake/alert Behavior During Therapy: WFL for tasks assessed/performed Overall Cognitive Status: Difficult to assess                                 General Comments: Oriented to at least self      Exercises      General Comments General comments (skin integrity, edema, etc.): mild drainage noted uppermost (proximal R thigh) dressing.  Pt agreeable to PT session.      Pertinent Vitals/Pain Pain Assessment: Faces Faces Pain Scale: Hurts a little bit (4/10 with activity; 2/10 with rest)  Pain Location: R hip/thigh Pain Descriptors / Indicators: Grimacing Pain Intervention(s): Limited activity within patient's tolerance;Monitored during session;Repositioned  HR WFL during sessions activities.  Oxygen extender tubing removed end of session and pt still satting 90% on 4 L O2 via nasal cannula.    Home Living Family/patient expects to be discharged to:: Other (Comment)               Additional Comments: Per chart pt lives at a nursing home    Prior Function        Comments: Per chart, pt ambulatory modified independent with walker; does most  of ADL's by self   PT Goals (current goals can now be found in the care plan section) Acute Rehab PT Goals Patient Stated Goal: to improve mobility PT Goal Formulation: With patient Time For Goal Achievement: 10/25/19 Potential to Achieve Goals: Good Progress towards PT goals: Progressing toward goals    Frequency    BID      PT Plan Current plan remains appropriate    Co-evaluation              AM-PAC PT "6 Clicks" Mobility   Outcome Measure  Help needed turning from your back to your side while in a flat bed without using bedrails?: A Little Help needed moving from lying on your back to sitting on the side of a flat bed without using bedrails?: A Lot Help needed moving to and from a bed to a chair (including a wheelchair)?: A Lot Help needed standing up from a chair using your arms (e.g., wheelchair or bedside chair)?: A Lot Help needed to walk in hospital room?: Total Help needed climbing 3-5 steps with a railing? : Total 6 Click Score: 11    End of Session Equipment Utilized During Treatment: Gait belt Activity Tolerance: Patient limited by fatigue Patient left: in bed;with call bell/phone within reach;with bed alarm set;with family/visitor present;Other (comment) (B heels floating via pillow support; bed in lowest position; fall mat in place) Nurse Communication: Mobility status;Precautions;Other (comment) (Pt's low O2 saturation and need for increasing O2) PT Visit Diagnosis: Other abnormalities of gait and mobility (R26.89);Muscle weakness (generalized) (M62.81);History of falling (Z91.81);Difficulty in walking, not elsewhere classified (R26.2);Pain Pain - Right/Left: Right Pain - part of body: Hip     Time: 1400-1445 PT Time Calculation (min) (ACUTE ONLY): 45 min  Charges:  $Therapeutic Exercise: 23-37 mins $Therapeutic Activity: 8-22 mins                     Hendricks Limes, PT 10/11/19, 4:36 PM

## 2019-10-11 NOTE — Evaluation (Signed)
Occupational Therapy Evaluation Patient Details Name: Christina Wise MRN: [id number] DOB: Mar 29, 1921 Today's Date: 10/11/2019    History of Present Illness Pt is a 87 y.o. female presenting to hospital 8/25 with R hip pain s/p fall.  Imaging showing R femoral intertrochanteric fx; age indeterminate R inferior pubic ramus fx.  Delayed surgical repair d/t pt fully anticoagulated.  Pt s/p IM fixation for R intertrochanteric hip fx 8/29.  PMH includes hearing impairment, htn, mild dementia, thyroid disease, post-phlebitic syndrome.   Clinical Impression   Pt was seen for OT evaluation this date POD #1 IM fixation. Prior to hospital admission, pt was apparently LTC resident, but pt is poor historian so unsure of previous functional status, chart indicates primarily MOD Indep. Currently pt demonstrates impairments as described below (See OT problem list) which functionally limit her ability to perform ADL/self-care tasks. Pt currently requires MIN/MOD A from elevated surface for ADL transfers, MOD A for LB dressing in sitting, and setup for seated UB ADLs. In addition, pt requires at least MIN verbal cues for safety/sequencing throughout.  Pt educated re: precautions with poor carryover this date. Pt would benefit from skilled OT to address noted impairments and functional limitations (see below for any additional details) in order to maximize safety and independence while minimizing falls risk and caregiver burden. Upon hospital discharge, recommend STR to maximize pt safety and return to PLOF.     Follow Up Recommendations  SNF;Supervision/Assistance - 24 hour    Equipment Recommendations  3 in 1 bedside commode    Recommendations for Other Services       Precautions / Restrictions Precautions Precautions: Fall Precaution Comments: Aspiration Restrictions Weight Bearing Restrictions: Yes RLE Weight Bearing: Weight bearing as tolerated      Mobility Bed Mobility Overal bed mobility:  Needs Assistance Bed Mobility: Supine to Sit     Supine to sit: Mod assist Sit to supine: Min assist;Mod assist;+2 for physical assistance;HOB elevated   General bed mobility comments: to manage LEs.  Transfers Overall transfer level: Needs assistance Equipment used: Rolling walker (2 wheeled) Transfers: Sit to/from Stand Sit to Stand: Min assist;Mod assist;From elevated surface         General transfer comment: MOD verbal cues for safe hand placement.    Balance Overall balance assessment: Needs assistance Sitting-balance support: No upper extremity supported;Feet supported Sitting balance-Leahy Scale: Fair Sitting balance - Comments: fair static sitting   Standing balance support: Bilateral upper extremity supported Standing balance-Leahy Scale: Poor Standing balance comment: requires MIN/MOD A with B UE support to sustain static sitting balance.                           ADL either performed or assessed with clinical judgement   ADL Overall ADL's : Needs assistance/impaired                                       General ADL Comments: Pt requires MOD A for LB ADLs in sitting, MIN/MOD A for sit to stand from elevated EOB with RW, setup for seated UB ADLs. Requries MIN verbal cues for sequence/safety throughout.     Vision Patient Visual Report: No change from baseline       Perception     Praxis      Pertinent Vitals/Pain Pain Assessment: Faces Faces Pain Scale: Hurts a little bit Pain Location: R  hip/thigh with mobilization. However, for majority of session, pt with little attn to post-op site. Pain Descriptors / Indicators: Grimacing Pain Intervention(s): Limited activity within patient's tolerance;Monitored during session;Repositioned     Hand Dominance     Extremity/Trunk Assessment Upper Extremity Assessment Upper Extremity Assessment: Generalized weakness   Lower Extremity Assessment Lower Extremity Assessment: Defer to  PT evaluation;RLE deficits/detail RLE: Unable to fully assess due to pain   Cervical / Trunk Assessment Cervical / Trunk Assessment: Normal   Communication Communication Communication: HOH   Cognition Arousal/Alertness: Awake/alert Behavior During Therapy: WFL for tasks assessed/performed Overall Cognitive Status: Difficult to assess                                 General Comments: Pt oriented to self, and when given two options: able to determine that she's in a hospital.   General Comments  mild drainage noted uppermost (proximal R thigh) dressing    Exercises Other Exercises Other Exercises: OT facilitates education re: role of OT and fall prevention including notifying pt of fall alarm and call light use. Pt with minimal understanding demonstrated and poor carryover d/t cognitive status.   Shoulder Instructions      Home Living Family/patient expects to be discharged to:: Other (Comment)                                 Additional Comments: Per chart pt lives at a nursing home      Prior Functioning/Environment          Comments: Pt is poor historian. Per chart, pt ambulatory modified independent with walker; does most of ADL's by self        OT Problem List: Decreased strength;Decreased range of motion;Decreased activity tolerance;Impaired balance (sitting and/or standing);Decreased cognition;Decreased safety awareness;Decreased knowledge of use of DME or AE;Decreased knowledge of precautions      OT Treatment/Interventions: Self-care/ADL training;Therapeutic exercise;DME and/or AE instruction;Therapeutic activities;Balance training;Patient/family education    OT Goals(Current goals can be found in the care plan section) Acute Rehab OT Goals Patient Stated Goal: to improve mobility OT Goal Formulation: With patient Time For Goal Achievement: 10/25/19 Potential to Achieve Goals: Good ADL Goals Pt Will Perform Upper Body Dressing:  with supervision;sitting Pt Will Perform Lower Body Dressing: with min assist;with adaptive equipment;sit to/from stand Pt Will Transfer to Toilet: with min guard assist;stand pivot transfer;bedside commode Pt Will Perform Toileting - Clothing Manipulation and hygiene: with min assist;sit to/from stand Pt/caregiver will Perform Home Exercise Program: Increased strength;Both right and left upper extremity;With minimal assist  OT Frequency: Min 1X/week   Barriers to D/C:            Co-evaluation              AM-PAC OT "6 Clicks" Daily Activity     Outcome Measure Help from another person eating meals?: None Help from another person taking care of personal grooming?: A Little Help from another person toileting, which includes using toliet, bedpan, or urinal?: A Lot Help from another person bathing (including washing, rinsing, drying)?: A Lot Help from another person to put on and taking off regular upper body clothing?: A Little Help from another person to put on and taking off regular lower body clothing?: A Lot 6 Click Score: 16   End of Session Equipment Utilized During Treatment: Gait belt;Rolling walker Nurse Communication: Mobility  status  Activity Tolerance: Patient tolerated treatment well Patient left: in chair;with call bell/phone within reach;with chair alarm set  OT Visit Diagnosis: Unsteadiness on feet (R26.81);Other abnormalities of gait and mobility (R26.89);Muscle weakness (generalized) (M62.81);History of falling (Z91.81)                Time: 5885-0277 OT Time Calculation (min): 27 min Charges:  OT General Charges $OT Visit: 1 Visit OT Evaluation $OT Eval Moderate Complexity: 1 Mod OT Treatments $Self Care/Home Management : 8-22 mins  Rejeana Brock, MS, OTR/L ascom [phone] 10/11/19, 5:08 PM

## 2019-10-11 NOTE — Progress Notes (Signed)
PROGRESS NOTE    ANH BIGOS  EQA:[id number] DOB: 06-27-1921 DOA: 10/06/2019 PCP: Patient, No Pcp Per   Brief Narrative:  86 year old with history of advanced dementia, CKD stage IIIb, HTN, hypothyroidism, anxiety, superficial DVT on low-dose Xarelto admitted from ALF for a witnessed fall suffering from right femoral intertrochanteric fracture.  CT head and cervical spine were negative.  Underwent intramedullary fixation on 8/29 by orthopedic   Assessment & Plan:   Active Problems:   Protein-calorie malnutrition, severe   Hip fracture (HCC)   Fall   DNR (do not resuscitate)   Goals of care, counseling/discussion   Advanced care planning/counseling discussion   Palliative care by specialist  Unwitnessed fall at nursing facility Acute right femoral intratrochanteric fracture Status post intramedullary fixation of the right hip 8/29 Postop recommendations for DVT prophylaxis, weightbearing, dressing changes and follow-up with orthopedic PT/OT Pain control, bowel regimen  Acute blood loss anemia -Baseline hemoglobin 10.0, today 7.7.  Closely monitor.  Transfuse if necessary.  AKI: Likely secondary to dehydration.    Continue gentle hydration  Essential hypertension:  -Continue Norvasc 10 mg daily  Hyperglycemia without diagnosis of diabetes: A1c 5.8%.  Anxiety/depression: Stable -Continue home medications  Dementia without behavioral disturbance -Reorientation and delirium precautions  Severe protein calorie malnutrition:  -Encourage oral intake  Hypothyroidism -Continue home Synthroid  History of superficial DVT on low-dose Xarelto -Resume anticoagulation per surgery.  Xarelto 10 mg daily has been ordered  GOC/DNR/DNI-appropriate. -Palliative care consulted.  Appreciate input.  Discontinue Foley catheter.    DVT prophylaxis: Xarelto Code Status: DNR Family Communication:    Status is: Inpatient  Remains inpatient appropriate  because:Unsafe d/c plan   Dispo: The patient is from: Home              Anticipated d/c is to: SNF              Anticipated d/c date is: 2 days              Patient currently is not medically stable to d/c.Pending PT/OT evaluation.    Body mass index is 25.75 kg/m.    Subjective: Sitting in the chair, no new complaints. Tolerating PO, eating her breakfast.   Review of Systems Otherwise negative except as per HPI, including: General: Denies fever, chills, night sweats or unintended weight loss. Resp: Denies cough, wheezing, shortness of breath. Cardiac: Denies chest pain, palpitations, orthopnea, paroxysmal nocturnal dyspnea. GI: Denies abdominal pain, nausea, vomiting, diarrhea or constipation GU: Denies dysuria, frequency, hesitancy or incontinence MS: Denies muscle aches, joint pain or swelling Neuro: Denies headache, neurologic deficits (focal weakness, numbness, tingling), abnormal gait Psych: Denies anxiety, depression, SI/HI/AVH Skin: Denies new rashes or lesions ID: Denies sick contacts, exotic exposures, travel  Examination:  General exam: Elderly frail.  Respiratory system: Clear to auscultation. Respiratory effort normal. Cardiovascular system: S1 & S2 heard, RRR. No JVD, murmurs, rubs, gallops or clicks. No pedal edema. Gastrointestinal system: Abdomen is nondistended, soft and nontender. No organomegaly or masses felt. Normal bowel sounds heard. Central nervous system: Alert and oriented. No focal neurological deficits. Extremities: Symmetric 4 x 5 power. Skin: Right hip dressing noted.  Psychiatry: Judgement and insight appear poor    Objective: Vitals:   10/10/19 2048 10/11/19 0049 10/11/19 0445 10/11/19 0748  BP: (!) 121/56 125/64 (!) 124/57 116/62  Pulse: 71 80 79 71  Resp: 16 16 15 17   Temp: 98.4 F (36.9 C) 98.6 F (37 C) 98.2 F (36.8 C) 98.5 F (36.9 C)  TempSrc: Oral Oral Oral Oral  SpO2: 91% 93% 92% 93%  Weight:        Intake/Output  Summary (Last 24 hours) at 10/11/2019 0811 Last data filed at 10/10/2019 2002 Gross per 24 hour  Intake 873.85 ml  Output 400 ml  Net 473.85 ml   Filed Weights   10/06/19 2015  Weight: 68 kg     Data Reviewed:   CBC: Recent Labs  Lab 10/06/19 2012 10/07/19 0513 10/08/19 0532 10/10/19 0406 10/11/19 0508  WBC 9.6 10.4 11.6* 11.6* 10.4  NEUTROABS 7.7  --   --   --   --   HGB 13.0 11.3* 11.0* 10.4* 7.7*  HCT 38.8 33.4* 32.4* 30.3* 23.7*  MCV 85.5 84.8 85.3 84.9 88.4  PLT 171 174 182 190 178   Basic Metabolic Panel: Recent Labs  Lab 10/06/19 2012 10/07/19 0513 10/08/19 0532 10/10/19 0406 10/11/19 0508  NA 138 139 136 134* 135  K 4.2 4.5 3.9 3.5 4.8  CL 102 103 99 99 103  CO2 24 26 26 25 29   GLUCOSE 163* 150* 111* 117* 117*  BUN 25* 21 23 24* 34*  CREATININE 1.21* 0.99 1.00 1.02* 1.22*  CALCIUM 9.2 8.7* 8.6* 8.3* 7.6*  MG  --   --  1.6* 1.6*  --   PHOS  --   --  2.6 2.5  --    GFR: CrCl cannot be calculated (Unknown ideal weight.). Liver Function Tests: Recent Labs  Lab 10/08/19 0532 10/10/19 0406  ALBUMIN 3.4* 3.1*   No results for input(s): LIPASE, AMYLASE in the last 168 hours. No results for input(s): AMMONIA in the last 168 hours. Coagulation Profile: No results for input(s): INR, PROTIME in the last 168 hours. Cardiac Enzymes: Recent Labs  Lab 10/07/19 1224  CKTOTAL 159   BNP (last 3 results) No results for input(s): PROBNP in the last 8760 hours. HbA1C: No results for input(s): HGBA1C in the last 72 hours. CBG: No results for input(s): GLUCAP in the last 168 hours. Lipid Profile: No results for input(s): CHOL, HDL, LDLCALC, TRIG, CHOLHDL, LDLDIRECT in the last 72 hours. Thyroid Function Tests: No results for input(s): TSH, T4TOTAL, FREET4, T3FREE, THYROIDAB in the last 72 hours. Anemia Panel: No results for input(s): VITAMINB12, FOLATE, FERRITIN, TIBC, IRON, RETICCTPCT in the last 72 hours. Sepsis Labs: No results for input(s):  PROCALCITON, LATICACIDVEN in the last 168 hours.  Recent Results (from the past 240 hour(s))  SARS Coronavirus 2 by RT PCR (hospital order, performed in [facility] hospital lab) Nasopharyngeal Nasopharyngeal Swab     Status: None   Collection Time: 10/06/19  9:57 PM   Specimen: Nasopharyngeal Swab  Result Value Ref Range Status   SARS Coronavirus 2 NEGATIVE NEGATIVE Final    Comment: (NOTE) SARS-CoV-2 target nucleic acids are NOT DETECTED.  The SARS-CoV-2 RNA is generally detectable in upper and lower respiratory specimens during the acute phase of infection. The lowest concentration of SARS-CoV-2 viral copies this assay can detect is 250 copies / mL. A negative result does not preclude SARS-CoV-2 infection and should not be used as the sole basis for treatment or other patient management decisions.  A negative result may occur with improper specimen collection / handling, submission of specimen other than nasopharyngeal swab, presence of viral mutation(s) within the areas targeted by this assay, and inadequate number of viral copies (<250 copies / mL). A negative result must be combined with clinical observations, patient history, and epidemiological information.  Fact Sheet for  Patients:   BoilerBrush.com.cy  Fact Sheet for Healthcare Providers: https://pope.com/  This test is not yet approved or  cleared by the [place] FDA and has been authorized for detection and/or diagnosis of SARS-CoV-2 by FDA under an Emergency Use Authorization (EUA).  This EUA will remain in effect (meaning this test can be used) for the duration of the COVID-19 declaration under Section 564(b)(1) of the Act, 21 U.S.C. section 360bbb-3(b)(1), unless the authorization is terminated or revoked sooner.  Performed at [facility], [address] [place], [place] [address]   MRSA PCR Screening     Status: None   Collection Time: 10/07/19   7:00 AM   Specimen: Nasopharyngeal  Result Value Ref Range Status   MRSA by PCR NEGATIVE NEGATIVE Final    Comment:        The GeneXpert MRSA Assay (FDA approved for NASAL specimens only), is one component of a comprehensive MRSA colonization surveillance program. It is not intended to diagnose MRSA infection nor to guide or monitor treatment for MRSA infections. Performed at [facility], [address] [place], [place] [address]          Radiology Studies: DG HIP OPERATIVE UNILAT W OR W/O PELVIS RIGHT  Result Date: 10/10/2019 CLINICAL DATA:  RIGHT hip fracture EXAM: OPERATIVE RIGHT HIP (WITH PELVIS IF PERFORMED) for VIEWS TECHNIQUE: Fluoroscopic spot image(s) were submitted for interpretation post-operatively. COMPARISON:  10/06/2019 FINDINGS: Osseous demineralization. IM nail with compression screw placed across reduced intertrochanteric fracture of RIGHT femur. Two distal locking screws noted. No additional fracture, dislocation or bone destruction. IMPRESSION: Post ORIF of intertrochanteric fracture RIGHT femur. Electronically Signed   By: Ulyses Southward M.D.   On: 10/10/2019 12:06   DG FEMUR, MIN 2 VIEWS RIGHT  Result Date: 10/10/2019 CLINICAL DATA:  Post RIGHT hip surgery EXAM: RIGHT FEMUR 2 VIEWS COMPARISON:  Portable exam 1116 hours compared to intraoperative images of 10/10/2019 FINDINGS: IM nail with compression screw identified at proximal RIGHT femur post ORIF of an intertrochanteric fracture. Two distal locking screws. Osseous demineralization. No additional fracture or dislocation. Degenerative changes and question CPPD RIGHT knee. IMPRESSION: Post ORIF of intertrochanteric fracture RIGHT femur. Electronically Signed   By: Ulyses Southward M.D.   On: 10/10/2019 12:05        Scheduled Meds: . acetaminophen  500 mg Oral Q6H  . amLODipine  10 mg Oral Daily  . Chlorhexidine Gluconate Cloth  6 each Topical Daily  . divalproex  125 mg Oral Q12H  . docusate sodium   100 mg Oral BID  . feeding supplement (ENSURE ENLIVE)  237 mL Oral BID BM  . levothyroxine  100 mcg Oral Daily  . rivaroxaban  10 mg Oral Daily  . senna  1 tablet Oral BID  . sertraline  25 mg Oral Daily   Continuous Infusions: . sodium chloride 75 mL/hr (10/11/19 0434)  . methocarbamol (ROBAXIN) IV       LOS: 5 days   Time spent= 35 mins    Meshawn Oconnor Joline Maxcy, MD [facility]  If 7PM-7AM, please contact night-coverage  10/11/2019, 8:11 AM

## 2019-10-11 NOTE — Plan of Care (Addendum)
   Problem: Clinical Measurements: Goal: Ability to maintain clinical measurements within normal limits will improve Outcome: Progressing Goal: Will remain free from infection Outcome: Progressing Goal: Diagnostic test results will improve Outcome: Progressing Goal: Respiratory complications will improve Outcome: Progressing Goal: Cardiovascular complication will be avoided Outcome: Progressing   Problem: Nutrition: Goal: Adequate nutrition will be maintained Outcome: Progressing   Problem: Coping: Goal: Level of anxiety will decrease Outcome: Progressing   Problem: Elimination: Goal: Will not experience complications related to bowel motility Outcome: Progressing Goal: Will not experience complications related to urinary retention Outcome: Progressing   Problem: Pain Managment: Goal: General experience of comfort will improve Outcome: Progressing   Problem: Safety: Goal: Ability to remain free from injury will improve Outcome: Progressing   Problem: Skin Integrity: Goal: Risk for impaired skin integrity will decrease Outcome: Progressing   Problem: Education: Goal: Knowledge of General Education information will improve Description: Including pain rating scale, medication(s)/side effects and non-pharmacologic comfort measures Outcome: Not Progressing   Problem: Health Behavior/Discharge Planning: Goal: Ability to manage health-related needs will improve Outcome: Not Progressing

## 2019-10-11 NOTE — Progress Notes (Signed)
Foley removed, patient refused lunch tray

## 2019-10-11 NOTE — Evaluation (Signed)
Physical Therapy Evaluation Patient Details Name: Christina Wise MRN: [id number] DOB: 1921/08/15 Today's Date: 10/11/2019   History of Present Illness  Pt is a 86 y.o. female presenting to hospital 8/25 with R hip pain s/p fall.  Imaging showing R femoral intertrochanteric fx; age indeterminate R inferior pubic ramus fx.  Delayed surgical repair d/t pt fully anticoagulated.  Pt s/p IM fixation for R intertrochanteric hip fx 8/29.  PMH includes hearing impairment, htn, mild dementia, thyroid disease, post-phlebitic syndrome.  Clinical Impression  Prior to hospital admission, per chart review pt was ambulatory with walker; lives at nursing home.  Currently pt is mod to max assist semi-supine to sitting edge of bed; min assist x2 with transfers; and min assist x2 with ambulating a few feet bed to recliner with RW.  Pt very HOH but did fairly well with visual and tactile cues.  Pt appearing mildly confused at times (but also complicated d/t HOH).  Pt would benefit from skilled PT to address noted impairments and functional limitations (see below for any additional details).  Upon hospital discharge, pt would benefit from STR.    Follow Up Recommendations SNF    Equipment Recommendations  Rolling walker with 5" wheels;3in1 (PT)    Recommendations for Other Services OT consult     Precautions / Restrictions Precautions Precautions: Fall Precaution Comments: Aspiration Restrictions Weight Bearing Restrictions: Yes RLE Weight Bearing: Weight bearing as tolerated      Mobility  Bed Mobility Overal bed mobility: Needs Assistance Bed Mobility: Supine to Sit     Supine to sit: Mod assist;Max assist;HOB elevated     General bed mobility comments: assist for R>L LE and trunk; visual and tactile cues for technique  Transfers Overall transfer level: Needs assistance Equipment used: Rolling walker (2 wheeled) Transfers: Sit to/from Stand Sit to Stand: Min assist;+2 physical  assistance         General transfer comment: visual and tactile cues for UE/LE placement and overall technique (sit to stand from bed and then stand to sit into chair)  Ambulation/Gait Ambulation/Gait assistance: Min assist;+2 physical assistance Gait Distance (Feet): 3 Feet (bed to recliner) Assistive device: Rolling walker (2 wheeled)   Gait velocity: decreased   General Gait Details: antalgic; decreased stance time R LE; decreased B LE step length/foot clearance  Stairs            Wheelchair Mobility    Modified Rankin (Stroke Patients Only)       Balance Overall balance assessment: Needs assistance Sitting-balance support: No upper extremity supported;Feet supported Sitting balance-Leahy Scale: Fair Sitting balance - Comments: steady static sitting edge of bed   Standing balance support: Single extremity supported Standing balance-Leahy Scale: Poor Standing balance comment: pt requiring at least single UE support for static standing balance                             Pertinent Vitals/Pain Pain Assessment: Faces Faces Pain Scale: Hurts a little bit (4/10 with activity; 2/10 at rest) Pain Location: R hip/thigh Pain Descriptors / Indicators: Grimacing Pain Intervention(s): Limited activity within patient's tolerance;Monitored during session;Repositioned  HR WFL during sessions activities. Oxygen extender placed for mobility and pt's nurse increased pt's supplemental O2 from 2 to 2.5 L (pt's O2 90%);  End of session pt's O2 88-90%--therapist increased O2 to 3 L and O2 sats 93% end of session at rest (nurse notified).    Home Living Family/patient expects to be  discharged to:: Other (Comment)                 Additional Comments: Per chart pt lives at a nursing home    Prior Function           Comments: Per chart, pt ambulatory modified independent with walker; does most of ADL's by self     Hand Dominance        Extremity/Trunk  Assessment   Upper Extremity Assessment Upper Extremity Assessment: Generalized weakness    Lower Extremity Assessment Lower Extremity Assessment: RLE deficits/detail (L LE WFL) RLE Deficits / Details: hip flexion at least 3-/5; knee flexion/extension at least 3-/5; DF/PF at least 3/5 AROM RLE: Unable to fully assess due to pain    Cervical / Trunk Assessment Cervical / Trunk Assessment: Normal  Communication   Communication: HOH  Cognition Arousal/Alertness: Awake/alert Behavior During Therapy: WFL for tasks assessed/performed Overall Cognitive Status: Difficult to assess                                 General Comments: Oriented to at least self and that she had a procedure done      General Comments General comments (skin integrity, edema, etc.): mild drainage noted uppermost (proximal R thigh) dressing.  Nursing cleared pt for participation in physical therapy.  Pt agreeable to PT session.    Exercises  Transfer training   Assessment/Plan    PT Assessment Patient needs continued PT services  PT Problem List Decreased strength;Decreased activity tolerance;Decreased balance;Decreased mobility;Decreased knowledge of use of DME;Decreased knowledge of precautions;Pain;Decreased skin integrity       PT Treatment Interventions DME instruction;Gait training;Functional mobility training;Therapeutic activities;Therapeutic exercise;Balance training;Patient/family education    PT Goals (Current goals can be found in the Care Plan section)  Acute Rehab PT Goals Patient Stated Goal: to improve mobility PT Goal Formulation: With patient Time For Goal Achievement: 10/25/19 Potential to Achieve Goals: Good    Frequency BID   Barriers to discharge Decreased caregiver support      Co-evaluation               AM-PAC PT "6 Clicks" Mobility  Outcome Measure Help needed turning from your back to your side while in a flat bed without using bedrails?: A  Little Help needed moving from lying on your back to sitting on the side of a flat bed without using bedrails?: A Lot Help needed moving to and from a bed to a chair (including a wheelchair)?: A Lot Help needed standing up from a chair using your arms (e.g., wheelchair or bedside chair)?: A Lot Help needed to walk in hospital room?: Total Help needed climbing 3-5 steps with a railing? : Total 6 Click Score: 11    End of Session Equipment Utilized During Treatment: Gait belt Activity Tolerance: Patient tolerated treatment well Patient left: in chair;with call bell/phone within reach;with chair alarm set;Other (comment) (B heels floating via pillow support)   PT Visit Diagnosis: Other abnormalities of gait and mobility (R26.89);Muscle weakness (generalized) (M62.81);History of falling (Z91.81);Difficulty in walking, not elsewhere classified (R26.2);Pain Pain - Right/Left: Right Pain - part of body: Hip    Time: 9450-3888 PT Time Calculation (min) (ACUTE ONLY): 44 min   Charges:   PT Evaluation $PT Eval Low Complexity: 1 Low PT Treatments $Therapeutic Exercise: 8-22 mins $Therapeutic Activity: 8-22 mins       Hendricks Limes, PT 10/11/19, 12:45 PM

## 2019-10-11 NOTE — Progress Notes (Addendum)
The information in this patient note, response to treatment, and overall treatment plan developed has been reviewed and agreed upon by this clinician.  Orinda Kenner, [place], [place] [phone] 10/12/2019   10/12/19,8:28 AM Speech Language Pathology Treatment: Dysphagia  Patient Details Name: Christina Wise MRN: [id number] DOB: [birth year] Today's Date: 10/11/2019 Time: 5189-8421 SLP Time Calculation (min) (ACUTE ONLY): 35 min  Assessment / Plan / Recommendation Clinical Impression  Pt seen today for ongoing assessment of toleration of diet and education on aspiration precautions for staff and son. Pt has a baseline h/o dementia w/ recent hip fracture repair. Nursing and son report min PO intake and h/o pt refusing food -- son stated pt was eating more today. Noted today's dietician consult indicating severe malnutrition d/t limited PO intake. Pt was cooperative and agreed to eat various textures (thin, puree) as they were presented by SLP. Thin liquids administered via straw and puree texture via spoon w/ max feeding assist by SLP. Pt required moderate visual and verbal cues to reorient attention to the task and foods as they were presented. Pt responsive to both visual and verbal cues. Oral phase dysphagia indicated by prolonged mastication and prolonged bolus formation during PO trials of puree texture. Overt s/s of oropharyngeal phase dysphagia noted 1x c/b immediate cough following multiple sips of thin liquid administered via straw. Once pinched straw strategy was implemented, no further coughing noted. Recommend controlling rate and amount of oral intake of thin liquids using the compensatory strategy of pinching straw to limit intake of multiple sips. Pt appears at reduced risk for aspiration when following aspiration precautions. Recommend continue a dysphagia 2 (fine chop) diet and thin liquids. Medication administrated crushed with puree foods, and continued staff assist w/ self  feeding as well as full supervision of compensatory strategies. Provided support, positioning, and supervision, pt appears to present with reduced risk for aspiration. No further skilled ST services indicated at this time as pt appears at her baseline. Education w/ son and nursing was completed regarding the impact of dementia on swallowing and oral intake. Recommend continued follow up by dietician for nutritional support.      HPI: Per admitting H and P " Christina Wise is a 86 y.o. female with history of dementia, CKD, hypothyroidism, hypertension, who presents after a fall. Pt is s/p intramedullary fixation for left intertrochanteric hip fracture. Remains on O2 support.      SLP Plan  All goals met       Recommendations  Diet recommendations: Dysphagia 2 (fine chop);Thin liquid Liquids provided via: Straw Medication Administration: Crushed with puree Supervision: Staff to assist with self feeding;Full supervision/cueing for compensatory strategies Compensations: Slow rate;Minimize environmental distractions;Small sips/bites;Follow solids with liquid Postural Changes and/or Swallow Maneuvers: Seated upright 90 degrees                Oral Care Recommendations: Oral care BID;Oral care before and after PO;Staff/trained caregiver to provide oral care Follow up Recommendations: Skilled Nursing facility SLP Visit Diagnosis: Dysphagia, oral phase (R13.11) Plan: All goals met       [place]  Graduate Clinician 10/11/2019, 4:43 PM

## 2019-10-12 ENCOUNTER — Inpatient Hospital Stay: Payer: Medicare Other

## 2019-10-12 DIAGNOSIS — S72001G Fracture of unspecified part of neck of right femur, subsequent encounter for closed fracture with delayed healing: Secondary | ICD-10-CM

## 2019-10-12 DIAGNOSIS — R0902 Hypoxemia: Secondary | ICD-10-CM

## 2019-10-12 LAB — BASIC METABOLIC PANEL
Anion gap: 7 (ref 5–15)
BUN: 44 mg/dL — ABNORMAL HIGH (ref 8–23)
CO2: 26 mmol/L (ref 22–32)
Calcium: 7.4 mg/dL — ABNORMAL LOW (ref 8.9–10.3)
Chloride: 104 mmol/L (ref 98–111)
Creatinine, Ser: 1.13 mg/dL — ABNORMAL HIGH (ref 0.44–1.00)
GFR calc Af Amer: 48 mL/min — ABNORMAL LOW (ref >60)
GFR calc non Af Amer: 41 mL/min — ABNORMAL LOW (ref >60)
Glucose, Bld: 128 mg/dL — ABNORMAL HIGH (ref 70–99)
Potassium: 3.9 mmol/L (ref 3.5–5.1)
Sodium: 137 mmol/L (ref 135–145)

## 2019-10-12 LAB — CBC
HCT: 22.7 % — ABNORMAL LOW (ref 36.0–46.0)
Hemoglobin: 7.6 g/dL — ABNORMAL LOW (ref 12.0–15.0)
MCH: 28.9 pg (ref 26.0–34.0)
MCHC: 33.5 g/dL (ref 30.0–36.0)
MCV: 86.3 fL (ref 80.0–100.0)
Platelets: 202 10*3/uL (ref 150–400)
RBC: 2.63 MIL/uL — ABNORMAL LOW (ref 3.87–5.11)
RDW: 14.7 % (ref 11.5–15.5)
WBC: 9.7 10*3/uL (ref 4.0–10.5)
nRBC: 0 % (ref 0.0–0.2)

## 2019-10-12 LAB — BRAIN NATRIURETIC PEPTIDE: B Natriuretic Peptide: 215.7 pg/mL — ABNORMAL HIGH (ref 0.0–100.0)

## 2019-10-12 LAB — MAGNESIUM: Magnesium: 1.7 mg/dL (ref 1.7–2.4)

## 2019-10-12 MED ORDER — VITAMIN D 25 MCG (1000 UNIT) PO TABS
1000.0000 [IU] | ORAL_TABLET | Freq: Every day | ORAL | Status: DC
  Administered 2019-10-12 – 2019-10-14 (×3): 1000 [IU] via ORAL
  Filled 2019-10-12 (×3): qty 1

## 2019-10-12 MED ORDER — SODIUM CHLORIDE 0.9 % IV SOLN: INTRAVENOUS | Status: DC

## 2019-10-12 NOTE — TOC Progression Note (Signed)
Transition of Care [facility]) - Progression Note    Patient Details  Name: JANNATUL WOJDYLA MRN: [id number] Date of Birth: 1921-02-22  [facility]) CM/SW Contact  Barrie Dunker, RN Phone Number: 10/12/2019, 8:47 AM  Clinical Narrative:   [place] Cao, PASSR, and bedsearch completed, will review bed offers once obtained         Expected Discharge Plan and Services                                                 Social Determinants of Health (SDOH) Interventions    Readmission Risk Interventions No flowsheet data found.

## 2019-10-12 NOTE — Progress Notes (Signed)
PROGRESS NOTE    Christina QualeChristine N Hoel  HQI:[id number] DOB: 13-Oct-1921 DOA: 10/06/2019 PCP: Patient, No Pcp Per   Brief Narrative:  86 year old with history of advanced dementia, CKD stage IIIb, HTN, hypothyroidism, anxiety, superficial DVT on low-dose Xarelto admitted from ALF for a witnessed fall suffering from right femoral intertrochanteric fracture.  CT head and cervical spine were negative.  Underwent intramedullary fixation on 8/29 by orthopedic.  PT recommended SNF.  Postop developed hypoxia.   Assessment & Plan:   Active Problems:   Protein-calorie malnutrition, severe   Hip fracture (HCC)   Fall   DNR (do not resuscitate)   Goals of care, counseling/discussion   Advanced care planning/counseling discussion   Palliative care by specialist   Acute hypoxic respiratory failure currently on 7 L high flow nasal cannula -Suspect volume overload?.  Will check BNP, chest x-ray. Wean off O2.  -Stop IV fluids.  Unwitnessed fall at nursing facility Acute right femoral intratrochanteric fracture Status post intramedullary fixation of the right hip 8/29 Postop recommendations for DVT prophylaxis, weightbearing, dressing changes and follow-up with orthopedic PT/OT-SNF Pain control, bowel regimen  Vitamin D deficiency -Supplements ordered  Acute blood loss anemia -Baseline hemoglobin 10.0, today 7.6.  Closely monitor.  Transfuse if necessary.  AKI:  Improved.  Closely monitor.  Currently avoiding IV fluids  Essential hypertension:  -Continue Norvasc 10 mg daily  Hyperglycemia without diagnosis of diabetes: A1c 5.8%.  Anxiety/depression: Stable -Continue home medications  Dementia without behavioral disturbance -Reorientation and delirium precautions  Severe protein calorie malnutrition:  -Encourage oral intake  Hypothyroidism -Continue home Synthroid  History of superficial DVT on low-dose Xarelto -Resume anticoagulation per surgery.  Xarelto 10 mg  daily  GOC/DNR/DNI-appropriate. -Palliative care consulted.  Appreciate input.  Foley catheter discontinued 10/11/2019   DVT prophylaxis: Xarelto Code Status: DNR Family Communication: Patient's son is updated by me  Status is: Inpatient  Remains inpatient appropriate because:Unsafe d/c plan   Dispo: The patient is from: Home              Anticipated d/c is to: SNF              Anticipated d/c date is: 2 days              Patient currently is not medically stable to d/c.  Patient still requiring 7 L high flow nasal cannula, unsafe for discharge today.  Hopefully will discharge her in next 1-2 days once her hypoxia has improved.  Body mass index is 25.75 kg/m.    Subjective: Overnight O2 requirement increased, this morning on 6 7 L high flow nasal cannula.  Denies any complaints, she is resting well.  Very hard of hearing.  Review of Systems Otherwise negative except as per HPI, including: General = no fevers, chills, dizziness,  fatigue HEENT/EYES = negative for loss of vision, double vision, blurred vision,  sore throa Cardiovascular= negative for chest pain, palpitation Respiratory/lungs= negative for shortness of breath, cough, wheezing; hemoptysis,  Gastrointestinal= negative for nausea, vomiting, abdominal pain Genitourinary= negative for Dysuria MSK = Negative for arthralgia, myalgias Neurology= Negative for headache, numbness, tingling  Psychiatry= Negative for suicidal and homocidal ideation Skin= Negative for Rash   Examination: Constitutional: Not in acute distress on 7 L nasal cannula.  Elderly frail Respiratory: Anterior breath sounds are clear to auscultation but does have bibasilar crackles from the back Cardiovascular: Normal sinus rhythm, no rubs Abdomen: Nontender nondistended good bowel sounds Musculoskeletal: No edema noted Skin: No rashes seen Neurologic: CN  2-12 grossly intact.  And nonfocal Psychiatric: Poor judgment and  insight.  Objective: Vitals:   10/12/19 0025 10/12/19 0030 10/12/19 0100 10/12/19 0215  BP:      Pulse: 76     Resp:      Temp:      TempSrc:      SpO2: (!) 84% (!) 89% 91% 93%  Weight:      Height:        Intake/Output Summary (Last 24 hours) at 10/12/2019 0755 Last data filed at 10/12/2019 4193 Gross per 24 hour  Intake 0 ml  Output 350 ml  Net -350 ml   Filed Weights   10/06/19 2015  Weight: 68 kg     Data Reviewed:   CBC: Recent Labs  Lab 10/06/19 2012 10/06/19 2012 10/07/19 0513 10/08/19 0532 10/10/19 0406 10/11/19 0508 10/12/19 0206  WBC 9.6   < > 10.4 11.6* 11.6* 10.4 9.7  NEUTROABS 7.7  --   --   --   --   --   --   HGB 13.0   < > 11.3* 11.0* 10.4* 7.7* 7.6*  HCT 38.8   < > 33.4* 32.4* 30.3* 23.7* 22.7*  MCV 85.5   < > 84.8 85.3 84.9 88.4 86.3  PLT 171   < > 174 182 190 178 202   < > = values in this interval not displayed.   Basic Metabolic Panel: Recent Labs  Lab 10/07/19 0513 10/08/19 0532 10/10/19 0406 10/11/19 0508 10/12/19 0206  NA 139 136 134* 135 137  K 4.5 3.9 3.5 4.8 3.9  CL 103 99 99 103 104  CO2 26 26 25 29 26   GLUCOSE 150* 111* 117* 117* 128*  BUN 21 23 24* 34* 44*  CREATININE 0.99 1.00 1.02* 1.22* 1.13*  CALCIUM 8.7* 8.6* 8.3* 7.6* 7.4*  MG  --  1.6* 1.6*  --  1.7  PHOS  --  2.6 2.5  --   --    GFR: Estimated Creatinine Clearance: 28.2 mL/min (A) (by C-G formula based on SCr of 1.13 mg/dL (H)). Liver Function Tests: Recent Labs  Lab 10/08/19 0532 10/10/19 0406  ALBUMIN 3.4* 3.1*   No results for input(s): LIPASE, AMYLASE in the last 168 hours. No results for input(s): AMMONIA in the last 168 hours. Coagulation Profile: No results for input(s): INR, PROTIME in the last 168 hours. Cardiac Enzymes: Recent Labs  Lab 10/07/19 1224  CKTOTAL 159   BNP (last 3 results) No results for input(s): PROBNP in the last 8760 hours. HbA1C: No results for input(s): HGBA1C in the last 72 hours. CBG: No results for input(s):  GLUCAP in the last 168 hours. Lipid Profile: No results for input(s): CHOL, HDL, LDLCALC, TRIG, CHOLHDL, LDLDIRECT in the last 72 hours. Thyroid Function Tests: No results for input(s): TSH, T4TOTAL, FREET4, T3FREE, THYROIDAB in the last 72 hours. Anemia Panel: No results for input(s): VITAMINB12, FOLATE, FERRITIN, TIBC, IRON, RETICCTPCT in the last 72 hours. Sepsis Labs: No results for input(s): PROCALCITON, LATICACIDVEN in the last 168 hours.  Recent Results (from the past 240 hour(s))  SARS Coronavirus 2 by RT PCR (hospital order, performed in [facility] hospital lab) Nasopharyngeal Nasopharyngeal Swab     Status: None   Collection Time: 10/06/19  9:57 PM   Specimen: Nasopharyngeal Swab  Result Value Ref Range Status   SARS Coronavirus 2 NEGATIVE NEGATIVE Final    Comment: (NOTE) SARS-CoV-2 target nucleic acids are NOT DETECTED.  The SARS-CoV-2 RNA is generally detectable  in upper and lower respiratory specimens during the acute phase of infection. The lowest concentration of SARS-CoV-2 viral copies this assay can detect is 250 copies / mL. A negative result does not preclude SARS-CoV-2 infection and should not be used as the sole basis for treatment or other patient management decisions.  A negative result may occur with improper specimen collection / handling, submission of specimen other than nasopharyngeal swab, presence of viral mutation(s) within the areas targeted by this assay, and inadequate number of viral copies (<250 copies / mL). A negative result must be combined with clinical observations, patient history, and epidemiological information.  Fact Sheet for Patients:   BoilerBrush.com.cy  Fact Sheet for Healthcare Providers: https://pope.com/  This test is not yet approved or  cleared by the [place] FDA and has been authorized for detection and/or diagnosis of SARS-CoV-2 by FDA under an Emergency Use  Authorization (EUA).  This EUA will remain in effect (meaning this test can be used) for the duration of the COVID-19 declaration under Section 564(b)(1) of the Act, 21 U.S.C. section 360bbb-3(b)(1), unless the authorization is terminated or revoked sooner.  Performed at [facility], [address] [place], [place] [address]   MRSA PCR Screening     Status: None   Collection Time: 10/07/19  7:00 AM   Specimen: Nasopharyngeal  Result Value Ref Range Status   MRSA by PCR NEGATIVE NEGATIVE Final    Comment:        The GeneXpert MRSA Assay (FDA approved for NASAL specimens only), is one component of a comprehensive MRSA colonization surveillance program. It is not intended to diagnose MRSA infection nor to guide or monitor treatment for MRSA infections. Performed at [facility], [address] [place], [place] [address]          Radiology Studies: DG HIP OPERATIVE UNILAT W OR W/O PELVIS RIGHT  Result Date: 10/10/2019 CLINICAL DATA:  RIGHT hip fracture EXAM: OPERATIVE RIGHT HIP (WITH PELVIS IF PERFORMED) for VIEWS TECHNIQUE: Fluoroscopic spot image(s) were submitted for interpretation post-operatively. COMPARISON:  10/06/2019 FINDINGS: Osseous demineralization. IM nail with compression screw placed across reduced intertrochanteric fracture of RIGHT femur. Two distal locking screws noted. No additional fracture, dislocation or bone destruction. IMPRESSION: Post ORIF of intertrochanteric fracture RIGHT femur. Electronically Signed   By: Ulyses Southward M.D.   On: 10/10/2019 12:06   DG FEMUR, MIN 2 VIEWS RIGHT  Result Date: 10/10/2019 CLINICAL DATA:  Post RIGHT hip surgery EXAM: RIGHT FEMUR 2 VIEWS COMPARISON:  Portable exam 1116 hours compared to intraoperative images of 10/10/2019 FINDINGS: IM nail with compression screw identified at proximal RIGHT femur post ORIF of an intertrochanteric fracture. Two distal locking screws. Osseous demineralization. No additional  fracture or dislocation. Degenerative changes and question CPPD RIGHT knee. IMPRESSION: Post ORIF of intertrochanteric fracture RIGHT femur. Electronically Signed   By: Ulyses Southward M.D.   On: 10/10/2019 12:05        Scheduled Meds: . amLODipine  10 mg Oral Daily  . Chlorhexidine Gluconate Cloth  6 each Topical Daily  . divalproex  125 mg Oral Q12H  . docusate sodium  100 mg Oral BID  . feeding supplement (ENSURE ENLIVE)  237 mL Oral TID BM  . levothyroxine  100 mcg Oral Daily  . rivaroxaban  10 mg Oral Daily  . senna  1 tablet Oral BID  . sertraline  25 mg Oral Daily   Continuous Infusions: . methocarbamol (ROBAXIN) IV  LOS: 6 days   Time spent= 35 mins    Cem Kosman Joline Maxcy, MD [facility]  If 7PM-7AM, please contact night-coverage  10/12/2019, 7:55 AM

## 2019-10-12 NOTE — Progress Notes (Signed)
Physical Therapy Treatment Patient Details Name: Christina Wise MRN: [id number] DOB: [birth year] Today's Date: 10/12/2019    History of Present Illness Pt is a 86 y.o. female presenting to hospital 8/25 with R hip pain s/p fall.  Imaging showing R femoral intertrochanteric fx; age indeterminate R inferior pubic ramus fx.  Delayed surgical repair d/t pt fully anticoagulated.  Pt s/p IM fixation for R intertrochanteric hip fx 8/29.  PMH includes hearing impairment, htn, mild dementia, thyroid disease, post-phlebitic syndrome.    PT Comments    Pt was asleep in supine (HOB elevated ~ 20 degrees) on 5 L HFNC. sao2 97%. Reduced HFNC to 3 L and pt able to maintain > 92% throughout. RN notified pt left on 3L HFNC after session. Pt easily awakes and agrees to OOB. She is oriented to self only. Repeats " I'm cold." throughout session once blankets removed. She was able to achieve EOB short sit with increased time and Mod assist. Sat EOB a several minutes prior to standing. Vitals while sitting EOB; BP 140/60 HR 73bpm and sao2 94% on 3 L HFNC. She stood 2 x EOB prior to taking ~ 5 steps to recliner. Flexed antalgic step to gait pattern. Once sitting in recliner, pt unwilling to trial further gait distances 2/2 to wanting to be covered up with blankets. Cognition limiting progress but overall pt is progressing. She will required SNF at [place] to address deficits and assist pt to returning to PLOF.     Follow Up Recommendations  SNF     Equipment Recommendations  Rolling walker with 5" wheels;3in1 (PT)    Recommendations for Other Services       Precautions / Restrictions Precautions Precautions: Fall Precaution Comments: Aspiration Restrictions Weight Bearing Restrictions: Yes RLE Weight Bearing: Weight bearing as tolerated    Mobility  Bed Mobility Overal bed mobility: Needs Assistance Bed Mobility: Supine to Sit     Supine to sit: Mod assist;HOB elevated     General bed mobility  comments: mod assist + alot of time and vcs for sequencing. BP upon sitting up EOB 140/60 HR 73 and sao2 94% on 3 L HFNC  Transfers Overall transfer level: Needs assistance Equipment used: Rolling walker (2 wheeled) Transfers: Sit to/from Stand Sit to Stand: Min assist;From elevated surface         General transfer comment: pt was able to stand 2 x to RW with min assist + mod vcs for hand placement, safety and improved technique  Ambulation/Gait Ambulation/Gait assistance: Min assist Gait Distance (Feet): 5 Feet Assistive device: Rolling walker (2 wheeled) Gait Pattern/deviations: Step-to pattern;Trunk flexed Gait velocity: decreased   General Gait Details: Increased time to take ~ 5 steps from EOB to recliner. vcs throughout for posture correction and safety.   Stairs             Wheelchair Mobility    Modified Rankin (Stroke Patients Only)       Balance Overall balance assessment: Needs assistance Sitting-balance support: No upper extremity supported;Feet supported Sitting balance-Leahy Scale: Good Sitting balance - Comments: pt able to sit EOB x ~ 8 minutes without LOB or unsteadiness with feet support only   Standing balance support: Bilateral upper extremity supported Standing balance-Leahy Scale: Poor Standing balance comment: poor standing posture and safety awareness. High fall risk                            Cognition Arousal/Alertness: Awake/alert Behavior During  Therapy: WFL for tasks assessed/performed Overall Cognitive Status: Difficult to assess                                 General Comments: Pt oriented to self, and when given two options: able to determine that she's in a hospital.      Exercises      General Comments        Pertinent Vitals/Pain Pain Assessment: No/denies pain (denies pain at rest) Pain Location: R hip/thigh with mobilization.  Pain Descriptors / Indicators: Grimacing Pain  Intervention(s): Limited activity within patient's tolerance;Monitored during session;Repositioned;Ice applied    Home Living                      Prior Function            PT Goals (current goals can now be found in the care plan section) Acute Rehab PT Goals Patient Stated Goal: none stated Progress towards PT goals: Progressing toward goals    Frequency    BID      PT Plan Current plan remains appropriate    Co-evaluation              AM-PAC PT "6 Clicks" Mobility   Outcome Measure  Help needed turning from your back to your side while in a flat bed without using bedrails?: A Little Help needed moving from lying on your back to sitting on the side of a flat bed without using bedrails?: A Lot Help needed moving to and from a bed to a chair (including a wheelchair)?: A Lot Help needed standing up from a chair using your arms (e.g., wheelchair or bedside chair)?: A Lot Help needed to walk in hospital room?: A Lot Help needed climbing 3-5 steps with a railing? : A Lot 6 Click Score: 13    End of Session Equipment Utilized During Treatment: Gait belt Activity Tolerance: Patient limited by fatigue Patient left: in chair;with call bell/phone within reach;with chair alarm set;with nursing/sitter in room Nurse Communication: Mobility status;Precautions;Other (comment) (decreased HFNC from 5 L to 3 L HFNC) PT Visit Diagnosis: Other abnormalities of gait and mobility (R26.89);Muscle weakness (generalized) (M62.81);History of falling (Z91.81);Difficulty in walking, not elsewhere classified (R26.2);Pain Pain - Right/Left: Right Pain - part of body: Hip     Time: 6144-3154 PT Time Calculation (min) (ACUTE ONLY): 19 min  Charges:  $Therapeutic Activity: 8-22 mins                     Jetta Lout PTA 10/12/19, 1:14 PM

## 2019-10-12 NOTE — Progress Notes (Signed)
Approached by RN for pt with desats. Rn stated pt was 88% on 5L [place]. Pt was placed on 7L HFNC for SPO2 of 93%. RN aware.

## 2019-10-12 NOTE — NC FL2 (Signed)
[place] MEDICAID FL2 LEVEL OF CARE SCREENING TOOL     IDENTIFICATION  Patient Name: Christina Wise Birthdate: July 19, 1921 Sex: female Admission Date (Current Location): 10/06/2019  [place] and [place] Number:  Chiropodist and Address:  [facility], [address] [place], [place] [address]      Provider Number: [id number]  Attending Physician Name and Address:  Dimple Nanas, MD  Relative Name and Phone Number:  Walker Shadow [phone]    Current Level of Care: Hospital Recommended Level of Care: Skilled Nursing Facility Prior Approval Number:    Date Approved/Denied:   PASRR Number: [id number]  Discharge Plan: SNF    Current Diagnoses: Patient Active Problem List   Diagnosis Date Noted  . Fall   . DNR (do not resuscitate)   . Goals of care, counseling/discussion   . Advanced care planning/counseling discussion   . Palliative care by specialist   . Hip fracture (HCC) 10/06/2019  . 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 Protein-calorie malnutrition, severe 05/03/2018  . Hyponatremia 05/01/2018    Orientation RESPIRATION BLADDER Height & Weight     Self  O2 (4 liters) External catheter Weight: 68 kg Height:  5\' 4"  (162.6 cm)  BEHAVIORAL SYMPTOMS/MOOD NEUROLOGICAL BOWEL NUTRITION STATUS      Continent Diet (dysphagia 2 fine chopped, thin liquid)  AMBULATORY STATUS COMMUNICATION OF NEEDS Skin   Extensive Assist Verbally Surgical wounds                       Personal Care Assistance Level of Assistance  Bathing, Dressing Bathing Assistance: Limited assistance   Dressing Assistance: Limited assistance     Functional Limitations Info  Hearing, Sight Sight Info: Impaired Hearing Info: Impaired      SPECIAL CARE FACTORS FREQUENCY  PT (By licensed PT), OT (By licensed OT)     PT Frequency: 5 times per week OT Frequency: 5 times  per week            Contractures Contractures Info: Present    Additional Factors Info  Code Status, Allergies Code Status Info: DNR Allergies Info: Alendronate, Codeine, Risedronate           Current Medications (10/12/2019):  This is the current hospital active medication list Current Facility-Administered Medications  Medication Dose Route Frequency Provider Last Rate Last Admin  . acetaminophen (TYLENOL) tablet 325-650 mg  325-650 mg Oral Q6H PRN 10/14/2019, MD   650 mg at 10/11/19 2050  . ALPRAZolam 10/13/19) tablet 0.25 mg  0.25 mg Oral BID PRN Prudy Feeler, MD   0.25 mg at 10/11/19 2050  . alum & mag hydroxide-simeth (MAALOX/MYLANTA) 200-200-20 MG/5ML suspension 30 mL  30 mL Oral Q4H PRN 08-20-2000, MD      . amLODipine (NORVASC) tablet 10 mg  10 mg Oral Daily Juanell Fairly, MD   10 mg at 10/11/19 0954  . bisacodyl (DULCOLAX) suppository 10 mg  10 mg Rectal Daily PRN 10/13/19, MD      . Chlorhexidine Gluconate Cloth 2 % PADS 6 each  6 each Topical Daily Juanell Fairly, MD   6 each at 10/11/19 1100  . cholecalciferol (VITAMIN D3) tablet 1,000 Units  1,000 Units Oral Daily Amin, Ankit Chirag, MD      . divalproex (DEPAKOTE) DR tablet 125 mg  125 mg Oral Q12H 10/13/19, MD   125 mg at 10/12/19 0108  .  docusate sodium (COLACE) capsule 100 mg  100 mg Oral BID Juanell Fairly, MD   100 mg at 10/11/19 2050  . feeding supplement (ENSURE ENLIVE) (ENSURE ENLIVE) liquid 237 mL  237 mL Oral TID BM Amin, Ankit Chirag, MD   237 mL at 10/12/19 0107  . HYDROcodone-acetaminophen (NORCO) 7.5-325 MG per tablet 1-2 tablet  1-2 tablet Oral Q4H PRN Juanell Fairly, MD      . HYDROcodone-acetaminophen (NORCO/VICODIN) 5-325 MG per tablet 1-2 tablet  1-2 tablet Oral Q4H PRN Juanell Fairly, MD      . levothyroxine (SYNTHROID) tablet 100 mcg  100 mcg Oral Daily Juanell Fairly, MD   100 mcg at 10/11/19 0954  . magnesium citrate solution 1 Bottle  1 Bottle Oral Once  PRN Juanell Fairly, MD      . methocarbamol (ROBAXIN) tablet 500 mg  500 mg Oral Q6H PRN Juanell Fairly, MD   500 mg at 10/11/19 2050   Or  . methocarbamol (ROBAXIN) 500 mg in dextrose 5 % 50 mL IVPB  500 mg Intravenous Q6H PRN Juanell Fairly, MD      . morphine 2 MG/ML injection 0.5-1 mg  0.5-1 mg Intravenous Q2H PRN Juanell Fairly, MD   1 mg at 10/12/19 0520  . ondansetron (ZOFRAN) tablet 4 mg  4 mg Oral Q6H PRN Juanell Fairly, MD       Or  . ondansetron [facility]) injection 4 mg  4 mg Intravenous Q6H PRN Juanell Fairly, MD      . polyethylene glycol (MIRALAX / GLYCOLAX) packet 17 g  17 g Oral Daily PRN Juanell Fairly, MD      . rivaroxaban Carlena Hurl) tablet 10 mg  10 mg Oral Daily Juanell Fairly, MD   10 mg at 10/11/19 0953  . senna (SENOKOT) tablet 8.6 mg  1 tablet Oral BID Juanell Fairly, MD   8.6 mg at 10/11/19 2050  . sertraline (ZOLOFT) 20 MG/ML concentrated solution 25 mg  25 mg Oral Daily Juanell Fairly, MD   25 mg at 10/11/19 9326     Discharge Medications: Please see discharge summary for a list of discharge medications.  Relevant Imaging Results:  Relevant Lab Results:   Additional Information SS# [id number]  Barrie Dunker, RN

## 2019-10-12 NOTE — Progress Notes (Addendum)
SLP F/U Note  Patient Details Name: Christina Wise MRN: [id number] DOB: Feb 06, 1922   Cancelled treatment:       Reason Eval/Treat Not Completed:  (chart reviewed). Recommend continue the currently recommended diet consistency at discharge for conservation of energy d/t declined Pulmonary status and need for O2 support. Recommend finding foods that are appealing to pt. Aspiration precautions and Supervision w/ all oral intake d/t Cognitive decline. Pills in Puree. NSG to reconsult if any new needs arise during admit.    Jerilynn Som, MS, CCC-SLP Zain Lankford 10/12/2019, 8:31 AM

## 2019-10-12 NOTE — Progress Notes (Signed)
Occupational Therapy Treatment Patient Details Name: Christina Wise MRN: [id number] DOB: 06/19/1921 Today's Date: 10/12/2019    History of present illness Pt is a 86 y.o. female presenting to hospital 8/25 with R hip pain s/p fall.  Imaging showing R femoral intertrochanteric fx; age indeterminate R inferior pubic ramus fx.  Delayed surgical repair d/t pt fully anticoagulated.  Pt s/p IM fixation for R intertrochanteric hip fx 8/29.  PMH includes hearing impairment, htn, mild dementia, thyroid disease, post-phlebitic syndrome.   OT comments  Pt seen for OT tx this date to f/u re: safety with ADLs/ADL mobility. Pt with limited tolerance for OT this afternoon 2/2 c/o pain. RN notified and medicates during session. OT facilitates pt participation in bed level UB self care including self feeding with setup and cues for sequencing. In addition, OT engages pt in repositioning in bed for which pt requires MAX A. Extended time for all tasks d/t pt's cognitive level and pain level this session. Appears to be getting some relief toward end of session. Left in bed with call light in reach, RN in room finishing up with patient and pt setup to finish meal. Will continue to follow per POC. Anticipate SNF continues to be most appropriate d/c recommendation.    Follow Up Recommendations  SNF;Supervision/Assistance - 24 hour    Equipment Recommendations  3 in 1 bedside commode    Recommendations for Other Services      Precautions / Restrictions Precautions Precautions: Fall Precaution Comments: Aspiration Restrictions Weight Bearing Restrictions: Yes RLE Weight Bearing: Weight bearing as tolerated       Mobility Bed Mobility Overal bed mobility: Needs Assistance Bed Mobility: Rolling Rolling: Max assist       General bed mobility comments: MAX A to roll for repositioning as pt tearful when OT presents to room-reports pain, appears to be slid down in the bed, bedding somewhat  wrinkled/discheveled. OT assists to reposition while awating RN with pain medication  Transfers Overall transfer level: Needs assistance         General transfer comment: unable this date 2/2 pt c/o pain    Balance  Sitting balance - Comments: unable to assess this date as pt with pain and unable to tolerate transition to sitting   Standing balance comment: unable                           ADL either performed or assessed with clinical judgement   ADL Overall ADL's : Needs assistance/impaired Eating/Feeding: Set up;Cueing for sequencing Eating/Feeding Details (indicate cue type and reason): MIN cues to initiate task and to sequence as well as tray setup to optimize pt reaching/retrieving.                                   General ADL Comments: Pt participation limited d/t pain, mobilization limited this session. RN notified.     Vision Patient Visual Report: No change from baseline     Perception     Praxis      Cognition Arousal/Alertness: Awake/alert Behavior During Therapy: WFL for tasks assessed/performed Overall Cognitive Status: Difficult to assess                                 General Comments: Pt oriented to self, very HOH. Continues to be reltively confused. Can  follow simple one step commands, required increased time with all tasks and increased processing time with all commands        Exercises Other Exercises Other Exercises: OT utilizes sequencing cues to engage pt in bed level ADLs including washing hands and self feeding with SETUP assist. Other Exercises: OT uses calming techniques to assist with pt's pain perception. OT facilitates some pain relief with repositioning for which pt requires MAX A.   Shoulder Instructions       General Comments      Pertinent Vitals/ Pain       Pain Assessment: No/denies pain Faces Pain Scale: Hurts whole lot Pain Location: R hip/thigh at rest, pt tearful when OT  presents, RN gives pain medicine during session. Pain Descriptors / Indicators: Crying;Grimacing;Guarding Pain Intervention(s): Limited activity within patient's tolerance;Monitored during session;Repositioned;Patient requesting pain meds-RN notified;RN gave pain meds during session (elevated LEs higher off bed, helped pt scoot up in bed, elevated HOB/FOB more)  Home Living                                          Prior Functioning/Environment              Frequency  Min 1X/week        Progress Toward Goals  OT Goals(current goals can now be found in the care plan section)  Progress towards OT goals: Progressing toward goals  Acute Rehab OT Goals Patient Stated Goal: none stated OT Goal Formulation: With patient Time For Goal Achievement: 10/25/19 Potential to Achieve Goals: Good  Plan Discharge plan remains appropriate    Co-evaluation                 AM-PAC OT "6 Clicks" Daily Activity     Outcome Measure   Help from another person eating meals?: A Little Help from another person taking care of personal grooming?: A Little Help from another person toileting, which includes using toliet, bedpan, or urinal?: A Lot Help from another person bathing (including washing, rinsing, drying)?: A Lot Help from another person to put on and taking off regular upper body clothing?: A Little Help from another person to put on and taking off regular lower body clothing?: A Lot 6 Click Score: 15    End of Session    OT Visit Diagnosis: Unsteadiness on feet (R26.81);Other abnormalities of gait and mobility (R26.89);Muscle weakness (generalized) (M62.81);History of falling (Z91.81)   Activity Tolerance Patient limited by pain   Patient Left in bed;with bed alarm set;with call bell/phone within reach   Nurse Communication Patient requests pain meds        Time: 1626-1700 OT Time Calculation (min): 34 min  Charges: OT General Charges $OT Visit: 1  Visit OT Treatments $Self Care/Home Management : 8-22 mins $Therapeutic Activity: 8-22 mins  Rejeana Brock, MS, OTR/L ascom [phone] 10/12/19, 5:30 PM

## 2019-10-12 NOTE — Progress Notes (Signed)
  Subjective:  POD #2 s/p IM fixation for left IT hip fracture.   Patient confused.  OOB to chair.  NAD.  Objective:   VITALS:   Vitals:   10/12/19 0030 10/12/19 0100 10/12/19 0215 10/12/19 0815  BP:    (!) 139/55  Pulse:    70  Resp:    16  Temp:    98.3 F (36.8 C)  TempSrc:    Oral  SpO2: (!) 89% 91% 93% 96%  Weight:      Height:        PHYSICAL EXAM: Right lower extremity:  Patient can't follow commands. Dressing: moderate drainage No cellulitis present Compartment soft  LABS  Results for orders placed or performed during the hospital encounter of 10/06/19 (from the past 24 hour(s))  CBC     Status: Abnormal   Collection Time: 10/12/19  2:06 AM  Result Value Ref Range   WBC 9.7 4.0 - 10.5 K/uL   RBC 2.63 (L) 3.87 - 5.11 MIL/uL   Hemoglobin 7.6 (L) 12.0 - 15.0 g/dL   HCT 37.1 (L) 36 - 46 %   MCV 86.3 80.0 - 100.0 fL   MCH 28.9 26.0 - 34.0 pg   MCHC 33.5 30.0 - 36.0 g/dL   RDW 06.2 69.4 - 85.4 %   Platelets 202 150 - 400 K/uL   nRBC 0.0 0.0 - 0.2 %  Basic metabolic panel     Status: Abnormal   Collection Time: 10/12/19  2:06 AM  Result Value Ref Range   Sodium 137 135 - 145 mmol/L   Potassium 3.9 3.5 - 5.1 mmol/L   Chloride 104 98 - 111 mmol/L   CO2 26 22 - 32 mmol/L   Glucose, Bld 128 (H) 70 - 99 mg/dL   BUN 44 (H) 8 - 23 mg/dL   Creatinine, Ser 6.27 (H) 0.44 - 1.00 mg/dL   Calcium 7.4 (L) 8.9 - 10.3 mg/dL   GFR calc non Af Amer 41 (L) >60 mL/min   GFR calc Af Amer 48 (L) >60 mL/min   Anion gap 7 5 - 15  Magnesium     Status: None   Collection Time: 10/12/19  2:06 AM  Result Value Ref Range   Magnesium 1.7 1.7 - 2.4 mg/dL  Brain natriuretic peptide     Status: Abnormal   Collection Time: 10/12/19  8:12 AM  Result Value Ref Range   B Natriuretic Peptide 215.7 (H) 0.0 - 100.0 pg/mL    DG Chest Port 1 View  Result Date: 10/12/2019 CLINICAL DATA:  Dyspnea EXAM: PORTABLE CHEST 1 VIEW COMPARISON:  Portable exam 0805 hours compared to 10/06/2019  FINDINGS: Normal heart size, mediastinal contours, and pulmonary vascularity. Atherosclerotic calcification aorta. Low lung volumes with increased bibasilar atelectasis. Central peribronchial thickening. No infiltrate, pleural effusion or pneumothorax. Osseous demineralization with old posttraumatic deformity of proximal RIGHT humerus. IMPRESSION: Bronchitic changes with increased bibasilar atelectasis. Aortic Atherosclerosis (ICD10-I70.0). Electronically Signed   By: Ulyses Southward M.D.   On: 10/12/2019 08:23    Assessment/Plan: 2 Days Post-Op   Active Problems:   Protein-calorie malnutrition, severe   Hip fracture (HCC)   Fall   DNR (do not resuscitate)   Goals of care, counseling/discussion   Advanced care planning/counseling discussion   Palliative care by specialist  Stable from orthopaedic standpoint.  PT as tolerated.  Will need SNF.  Xarelto.    Juanell Fairly , MD 10/12/2019, 1:36 PM

## 2019-10-12 NOTE — Progress Notes (Signed)
Physical Therapy Treatment Patient Details Name: Christina Wise MRN: [id number] DOB: 1921-03-27 Today's Date: 10/12/2019    History of Present Illness Pt is a 86 y.o. female presenting to hospital 8/25 with R hip pain s/p fall.  Imaging showing R femoral intertrochanteric fx; age indeterminate R inferior pubic ramus fx.  Delayed surgical repair d/t pt fully anticoagulated.  Pt s/p IM fixation for R intertrochanteric hip fx 8/29.  PMH includes hearing impairment, htn, mild dementia, thyroid disease, post-phlebitic syndrome.    PT Comments    Pt was slouching in recliner with O2 laying in her lap upon arriving. sao2 81% without O2. Reapplied 3 L HFNC and sao2 increased to 92% after ~ 2 minutes. She was able to stand with min assist to RW. Endorses pain in RLE in wt bearing. Was able to ambulate back to bed with min assist. Antalgic step to gait pattern. Pt with barely touched lunch tray at bedside. Son arrived and will assist pt with feeding. She overall tolerated returning to bed well but has been limited by pain and fatigue. Pt left on 3 L HFNC with RN aware. Acute PT will continue to progress pt as able per POC.      Follow Up Recommendations  SNF     Equipment Recommendations  Rolling walker with 5" wheels;3in1 (PT)    Recommendations for Other Services       Precautions / Restrictions Precautions Precautions: Fall Precaution Comments: Aspiration Restrictions Weight Bearing Restrictions: Yes RLE Weight Bearing: Weight bearing as tolerated    Mobility  Bed Mobility Overal bed mobility: Needs Assistance Bed Mobility: Sit to Supine     Supine to sit: Mod assist;HOB elevated     General bed mobility comments: Pt initiates movemenst to get BLEs into bed but 2/2 to weakness and pain requires mod assist.  Transfers Overall transfer level: Needs assistance Equipment used: Rolling walker (2 wheeled) Transfers: Sit to/from Stand Sit to Stand: Min assist;From elevated  surface         General transfer comment: Pt was able to stand from recliner with min assist.   Ambulation/Gait Ambulation/Gait assistance: Min assist Gait Distance (Feet): 5 Feet Assistive device: Rolling walker (2 wheeled) Gait Pattern/deviations: Step-to pattern;Trunk flexed Gait velocity: decreased   General Gait Details: pt was able to tolerate ambulation from  recliner to EOB.    Stairs             Wheelchair Mobility    Modified Rankin (Stroke Patients Only)       Balance Overall balance assessment: Needs assistance Sitting-balance support: No upper extremity supported;Feet supported Sitting balance-Leahy Scale: Good Sitting balance - Comments: pt able to sit EOB x ~ 8 minutes without LOB or unsteadiness with feet support only   Standing balance support: Bilateral upper extremity supported;During functional activity Standing balance-Leahy Scale: Fair Standing balance comment: reliant on BUE on RW                            Cognition Arousal/Alertness: Awake/alert Behavior During Therapy: WFL for tasks assessed/performed Overall Cognitive Status: Difficult to assess                                 General Comments: pt is oriented to self but continues to be very confused. HOH. needs max time to perform all desired task.      Exercises  General Comments        Pertinent Vitals/Pain Pain Assessment: No/denies pain Faces Pain Scale: Hurts a little bit Pain Location: R hip/thigh with mobilization.  Pain Descriptors / Indicators: Grimacing Pain Intervention(s): Limited activity within patient's tolerance;Monitored during session    Home Living                      Prior Function            PT Goals (current goals can now be found in the care plan section) Acute Rehab PT Goals Patient Stated Goal: none stated Progress towards PT goals: Progressing toward goals    Frequency    BID      PT Plan  Current plan remains appropriate    Co-evaluation              AM-PAC PT "6 Clicks" Mobility   Outcome Measure  Help needed turning from your back to your side while in a flat bed without using bedrails?: A Little Help needed moving from lying on your back to sitting on the side of a flat bed without using bedrails?: A Lot Help needed moving to and from a bed to a chair (including a wheelchair)?: A Lot Help needed standing up from a chair using your arms (e.g., wheelchair or bedside chair)?: A Lot Help needed to walk in hospital room?: A Lot Help needed climbing 3-5 steps with a railing? : A Lot 6 Click Score: 13    End of Session Equipment Utilized During Treatment: Gait belt Activity Tolerance: Patient limited by fatigue Patient left: in bed;with call bell/phone within reach;with bed alarm set Nurse Communication: Mobility status;Precautions PT Visit Diagnosis: Other abnormalities of gait and mobility (R26.89);Muscle weakness (generalized) (M62.81);History of falling (Z91.81);Difficulty in walking, not elsewhere classified (R26.2);Pain Pain - Right/Left: Right Pain - part of body: Hip     Time: 1340-1355 PT Time Calculation (min) (ACUTE ONLY): 15 min  Charges:  $Therapeutic Activity: 8-22 mins                    Jetta Lout PTA 10/12/19, 2:16 PM

## 2019-10-13 DIAGNOSIS — Z86718 Personal history of other venous thrombosis and embolism: Secondary | ICD-10-CM

## 2019-10-13 LAB — CBC
HCT: 23 % — ABNORMAL LOW (ref 36.0–46.0)
Hemoglobin: 7.3 g/dL — ABNORMAL LOW (ref 12.0–15.0)
MCH: 28.9 pg (ref 26.0–34.0)
MCHC: 31.7 g/dL (ref 30.0–36.0)
MCV: 90.9 fL (ref 80.0–100.0)
Platelets: 196 10*3/uL (ref 150–400)
RBC: 2.53 MIL/uL — ABNORMAL LOW (ref 3.87–5.11)
RDW: 15.1 % (ref 11.5–15.5)
WBC: 7.8 10*3/uL (ref 4.0–10.5)
nRBC: 0 % (ref 0.0–0.2)

## 2019-10-13 LAB — BASIC METABOLIC PANEL
Anion gap: 7 (ref 5–15)
BUN: 40 mg/dL — ABNORMAL HIGH (ref 8–23)
CO2: 26 mmol/L (ref 22–32)
Calcium: 7.8 mg/dL — ABNORMAL LOW (ref 8.9–10.3)
Chloride: 107 mmol/L (ref 98–111)
Creatinine, Ser: 1.07 mg/dL — ABNORMAL HIGH (ref 0.44–1.00)
GFR calc Af Amer: 51 mL/min — ABNORMAL LOW (ref >60)
GFR calc non Af Amer: 44 mL/min — ABNORMAL LOW (ref >60)
Glucose, Bld: 97 mg/dL (ref 70–99)
Potassium: 4.6 mmol/L (ref 3.5–5.1)
Sodium: 140 mmol/L (ref 135–145)

## 2019-10-13 LAB — MAGNESIUM: Magnesium: 1.9 mg/dL (ref 1.7–2.4)

## 2019-10-13 NOTE — Progress Notes (Signed)
Occupational Therapy Treatment Patient Details Name: Christina Wise MRN: [id number] DOB: 1921-12-10 Today's Date: 10/13/2019    History of present illness Pt is a 86 y.o. female presenting to hospital 8/25 with R hip pain s/p fall.  Imaging showing R femoral intertrochanteric fx; age indeterminate R inferior pubic ramus fx.  Delayed surgical repair d/t pt fully anticoagulated.  Pt s/p IM fixation for R intertrochanteric hip fx 8/29.  PMH includes hearing impairment, htn, mild dementia, thyroid disease, post-phlebitic syndrome.   OT comments  Pt seen for OT tx this date to f/u re: safety with ADLs/ADL mobility. Pt with limited tolerance for addressing ADLs this date. Some perseveration on pain. RN notified and pt repositioned. OT facilitates pt transfer from chair to bed with MOD/MAX A (initailly trialed with RW, but unsuccessful, ultimately requiring arm in arm). Increased time required. Education and cues re: safe hand placement and safe transfer technique with poor reception detected. Pt left in bed with bed alarm activated, call bell in reach and CNA presenting to complete bladder scan reporting concerns for retention. Will continue to follow per POC. Anticipate SNF continues to be most appropriate d/c recommendation. Will attempt to coordinate session with pain mgt/nursing and re-assess goals next session as pt requiring more assist with ADL transfer this date.    Follow Up Recommendations  SNF;Supervision/Assistance - 24 hour    Equipment Recommendations  3 in 1 bedside commode    Recommendations for Other Services      Precautions / Restrictions Precautions Precautions: Fall Precaution Comments: Aspiration Restrictions Weight Bearing Restrictions: Yes RLE Weight Bearing: Weight bearing as tolerated       Mobility Bed Mobility Overal bed mobility: Needs Assistance Bed Mobility: Sit to Supine     Supine to sit: Max assist     General bed mobility comments: MAX A to  manage LEs for back to bed  Transfers Overall transfer level: Needs assistance Equipment used: Rolling walker (2 wheeled);1 person hand held assist Transfers: Sit to/from [employer] Sit to Stand: Mod assist Stand pivot transfers: Mod assist;Max assist       General transfer comment: attempted sit to stand from chair with RW initially, but pt's weight primarily in heels and she is unable to sustain longer than 5 seconds. SPS transfer completed with arm in arm technique from chair to bed.    Balance Overall balance assessment: Needs assistance Sitting-balance support: No upper extremity supported;Feet supported Sitting balance-Leahy Scale: Good     Standing balance support: Bilateral upper extremity supported;During functional activity Standing balance-Leahy Scale: Poor Standing balance comment: pt requires increased support this date to sustain standing, increased pain symptoms present, RN notified.                           ADL either performed or assessed with clinical judgement   ADL Overall ADL's : Needs assistance/impaired                                             Vision       Perception     Praxis      Cognition Arousal/Alertness: Awake/alert Behavior During Therapy: WFL for tasks assessed/performed Overall Cognitive Status: History of cognitive impairments - at baseline  General Comments: Pt is alert but disoriented. was able to follow commands        Exercises Other Exercises Other Exercises: OT attempts to engagse pt in LB ADLs with pt unable to attend this date in setting of pain. OT cues/educates re: safety with transfer including hand placement with poor carryover from pt.   Shoulder Instructions       General Comments      Pertinent Vitals/ Pain       Pain Assessment: Faces Pain Score:  (did not formally rate) Faces Pain Scale: Hurts even more Pain  Location: R hip/LE Pain Descriptors / Indicators: Guarding;Grimacing;Sore Pain Intervention(s): Limited activity within patient's tolerance;Monitored during session;Repositioned;Ice applied  Home Living                                          Prior Functioning/Environment              Frequency  Min 1X/week        Progress Toward Goals  OT Goals(current goals can now be found in the care plan section)  Progress towards OT goals: OT to reassess next treatment  Acute Rehab OT Goals Patient Stated Goal: none stated OT Goal Formulation: With patient Time For Goal Achievement: 10/25/19 Potential to Achieve Goals: Good  Plan Discharge plan remains appropriate    Co-evaluation                 AM-PAC OT "6 Clicks" Daily Activity     Outcome Measure   Help from another person eating meals?: A Little Help from another person taking care of personal grooming?: A Little Help from another person toileting, which includes using toliet, bedpan, or urinal?: A Lot Help from another person bathing (including washing, rinsing, drying)?: A Lot Help from another person to put on and taking off regular upper body clothing?: A Little Help from another person to put on and taking off regular lower body clothing?: A Lot 6 Click Score: 15    End of Session Equipment Utilized During Treatment: Gait belt;Rolling walker  OT Visit Diagnosis: Unsteadiness on feet (R26.81);Other abnormalities of gait and mobility (R26.89);Muscle weakness (generalized) (M62.81);History of falling (Z91.81)   Activity Tolerance Patient limited by pain   Patient Left in bed;with bed alarm set;with call bell/phone within reach   Nurse Communication          Time: 9562-1308 OT Time Calculation (min): 16 min  Charges: OT General Charges $OT Visit: 1 Visit OT Treatments $Therapeutic Activity: 8-22 mins  Rejeana Brock, MS, OTR/L ascom [phone] 10/13/19, 5:12 PM

## 2019-10-13 NOTE — Progress Notes (Signed)
Physical Therapy Treatment Patient Details Name: Christina Wise MRN: [id number] DOB: [birth year] Today's Date: 10/13/2019    History of Present Illness Pt is a 86 y.o. female presenting to hospital 8/25 with R hip pain s/p fall.  Imaging showing R femoral intertrochanteric fx; age indeterminate R inferior pubic ramus fx.  Delayed surgical repair d/t pt fully anticoagulated.  Pt s/p IM fixation for R intertrochanteric hip fx 8/29.  PMH includes hearing impairment, htn, mild dementia, thyroid disease, post-phlebitic syndrome.    PT Comments    Pt was awake in long sitting with son present upon arriving. She agrees to PT session but endorses having more pain. Unable to give accurate pain rating 2/2 to cognition deficits. Pt has baseline dementia and per son,"cognition is about the same as prior to admission." She required more assistance to exit R side of bed due to pain. She stood to RW with min assist from elevated bed height and then took steps to recliner. Pt is very HOH and requires increased time and vcs for safety and sequencing. Pt will need extensive skilled PT at DC. Recommend DC to SNF to address deficits and improve safe functional mobility.Pt was on 3 L o2 throughout session. Did require increase to 5 L O2 [place] for 2 minutes to recover to 90 % after de-sating to 86% in standing while on 3 L. Pt quickly recovers to 94% and was able to be returned to 3 L [place]. Saturating at 94% on 3 L when therapist left room. RN aware.       Follow Up Recommendations  SNF     Equipment Recommendations  Rolling walker with 5" wheels;3in1 (PT)    Recommendations for Other Services       Precautions / Restrictions Precautions Precautions: Fall Precaution Comments: Aspiration Restrictions Weight Bearing Restrictions: Yes RLE Weight Bearing: Weight bearing as tolerated    Mobility  Bed Mobility Overal bed mobility: Needs Assistance Bed Mobility: Supine to Sit     Supine to sit: Max assist      General bed mobility comments: Increased assistance and time required today 2/2 to pain.   Transfers Overall transfer level: Needs assistance Equipment used: Rolling walker (2 wheeled) Transfers: Sit to/from Stand Sit to Stand: Min assist;From elevated surface         General transfer comment: reports pain but continues to require only min assist to stand to RW. max vcs for technique, sequencing and safety  Ambulation/Gait Ambulation/Gait assistance: Min assist Gait Distance (Feet): 5 Feet Assistive device: Rolling walker (2 wheeled) Gait Pattern/deviations: Antalgic;Trunk flexed;Step-to pattern Gait velocity: decreased   General Gait Details: Pt has poor standing posture and wt acceptance on RLE. Very slow antalgic step to pattern   Stairs             Wheelchair Mobility    Modified Rankin (Stroke Patients Only)       Balance Overall balance assessment: Needs assistance Sitting-balance support: No upper extremity supported;Feet supported Sitting balance-Leahy Scale: Good Sitting balance - Comments: no LOB in sitting at EOB   Standing balance support: Bilateral upper extremity supported;During functional activity Standing balance-Leahy Scale: Fair Standing balance comment: pt very reliant on RW for support. hard to really assess 2/2 to pain and limited standing endurance.                            Cognition Arousal/Alertness: Awake/alert Behavior During Therapy: WFL for tasks assessed/performed Overall Cognitive Status:  Within Functional Limits for tasks assessed        General Comments: Pt has baseline dementia and per son (present throughout session) , " she is like this all the time."       Pertinent Vitals/Pain Pain Assessment: Faces Faces Pain Scale: Hurts even more Pain Location: R hip/LE Pain Descriptors / Indicators: Crying;Grimacing;Guarding Pain Intervention(s): Limited activity within patient's tolerance;Monitored during  session;Premedicated before session;Repositioned;Ice applied           PT Goals (current goals can now be found in the care plan section) Acute Rehab PT Goals Patient Stated Goal: none stated Progress towards PT goals: Progressing toward goals    Frequency    BID      PT Plan Current plan remains appropriate       AM-PAC PT "6 Clicks" Mobility   Outcome Measure  Help needed turning from your back to your side while in a flat bed without using bedrails?: A Little Help needed moving from lying on your back to sitting on the side of a flat bed without using bedrails?: A Lot Help needed moving to and from a bed to a chair (including a wheelchair)?: A Lot Help needed standing up from a chair using your arms (e.g., wheelchair or bedside chair)?: A Lot Help needed to walk in hospital room?: A Lot Help needed climbing 3-5 steps with a railing? : A Lot 6 Click Score: 13    End of Session Equipment Utilized During Treatment: Gait belt;Oxygen (3 L o2 [place] throughout) Activity Tolerance: Patient limited by pain;Patient tolerated treatment well Patient left: in chair;with call bell/phone within reach;with chair alarm set;with family/visitor present Nurse Communication: Mobility status;Precautions PT Visit Diagnosis: Other abnormalities of gait and mobility (R26.89);Muscle weakness (generalized) (M62.81);History of falling (Z91.81);Difficulty in walking, not elsewhere classified (R26.2);Pain Pain - Right/Left: Right Pain - part of body: Hip     Time: 0920-0940 PT Time Calculation (min) (ACUTE ONLY): 20 min  Charges:  $Therapeutic Activity: 8-22 mins                     Jetta Lout PTA 10/13/19, 12:14 PM

## 2019-10-13 NOTE — Progress Notes (Signed)
Physical Therapy Treatment Patient Details Name: Christina Wise MRN: [id number] DOB: 1921-07-10 Today's Date: 10/13/2019    History of Present Illness Pt is a 86 y.o. female presenting to hospital 8/25 with R hip pain s/p fall.  Imaging showing R femoral intertrochanteric fx; age indeterminate R inferior pubic ramus fx.  Delayed surgical repair d/t pt fully anticoagulated.  Pt s/p IM fixation for R intertrochanteric hip fx 8/29.  PMH includes hearing impairment, htn, mild dementia, thyroid disease, post-phlebitic syndrome.    PT Comments    Pt had just returned to bed from sitting in recliner x several hours. She reports decreased pain since foley placed and bladder emptied however continues to endorse pain. Performed bed level there ex with assistance. See exercises listed below. Pt has been limited by pain/cognition. Will need SNF at DC to address deficits and improve pt's safe functional mobility. At conclusion of session, pt is in bed with bed alarm set, ice pack applied to R LE and call bell in reach. Pt has had poor PO intake today.     Follow Up Recommendations  SNF     Equipment Recommendations  Rolling walker with 5" wheels;3in1 (PT)    Recommendations for Other Services       Precautions / Restrictions Precautions Precautions: Fall Precaution Comments: Aspiration Restrictions Weight Bearing Restrictions: Yes RLE Weight Bearing: Weight bearing as tolerated    Mobility  Bed Mobility       General bed mobility comments: deferred. pt just returned to bed with RN staff. she does agree to exercises in bed.         Cognition Arousal/Alertness: Awake/alert Behavior During Therapy: WFL for tasks assessed/performed Overall Cognitive Status: History of cognitive impairments - at baseline             General Comments: Pt is alert but disoriented. was able to follow commands and perform desired exercise.      Exercises General Exercises - Lower  Extremity Ankle Circles/Pumps: AROM;Both;10 reps Quad Sets: AROM;Both;10 reps Gluteal Sets: AROM;10 reps Heel Slides: AROM;5 reps Hip ABduction/ADduction: AAROM;10 reps;Right Straight Leg Raises: AAROM;Right;10 reps    General Comments        Pertinent Vitals/Pain Pain Assessment: 0-10 Pain Score:  (did not formally rate) Pain Location: R hip/LE Pain Descriptors / Indicators: Guarding;Grimacing;Sore Pain Intervention(s): Limited activity within patient's tolerance;Monitored during session;Repositioned;Ice applied    Home Living                      Prior Function            PT Goals (current goals can now be found in the care plan section) Acute Rehab PT Goals Patient Stated Goal: none stated Progress towards PT goals: Progressing toward goals    Frequency    BID      PT Plan Current plan remains appropriate    Co-evaluation              AM-PAC PT "6 Clicks" Mobility   Outcome Measure  Help needed turning from your back to your side while in a flat bed without using bedrails?: A Little Help needed moving from lying on your back to sitting on the side of a flat bed without using bedrails?: A Lot Help needed moving to and from a bed to a chair (including a wheelchair)?: A Lot Help needed standing up from a chair using your arms (e.g., wheelchair or bedside chair)?: A Lot Help needed to walk in hospital  room?: A Lot Help needed climbing 3-5 steps with a railing? : A Lot 6 Click Score: 13    End of Session Equipment Utilized During Treatment: Oxygen (3 L O2 ) Activity Tolerance: Patient limited by fatigue;Patient limited by pain Patient left: in bed;with call bell/phone within reach;with bed alarm set Nurse Communication: Mobility status;Precautions PT Visit Diagnosis: Other abnormalities of gait and mobility (R26.89);Muscle weakness (generalized) (M62.81);History of falling (Z91.81);Difficulty in walking, not elsewhere classified  (R26.2);Pain Pain - Right/Left: Right Pain - part of body: Hip     Time: 1411-1420 PT Time Calculation (min) (ACUTE ONLY): 9 min  Charges:  $Therapeutic Exercise: 8-22 mins                     Jetta Lout PTA 10/13/19, 2:47 PM

## 2019-10-13 NOTE — Progress Notes (Signed)
pts bladder scan 553 ml, MD notified.

## 2019-10-13 NOTE — Progress Notes (Signed)
PROGRESS NOTE    Christina Wise  OMV:[id number] DOB: 1922/02/04 DOA: 10/06/2019 PCP: Patient, No Pcp Per    Brief Narrative:  86 year old with history of advanced dementia, CKD stage IIIb, HTN, hypothyroidism, anxiety, superficial DVT on low-dose Xarelto admitted from ALF for a witnessed fall suffering from right femoral intertrochanteric fracture.  CT head and cervical spine were negative.  Underwent intramedullary fixation on 8/29 by orthopedic.  PT recommended SNF.  Postop developed hypoxia.     Consultants:   Ortho  Procedures: Foley discontinued 10/11/2019  Antimicrobials:      Subjective: Patient complaining of leg pain.  However was found with bladder scan in the 500s and once Foley placed she felt better her pain improved.  Objective: Vitals:   10/12/19 1735 10/12/19 1925 10/12/19 2321 10/13/19 0810  BP: (!) 136/52  (!) 137/52 (!) 149/56  Pulse: 77 76 70 77  Resp: 16  16 17   Temp: 98.1 F (36.7 C)  97.8 F (36.6 C) 98 F (36.7 C)  TempSrc: Oral  Oral Oral  SpO2: 94% 92% 96% 94%  Weight:      Height:        Intake/Output Summary (Last 24 hours) at 10/13/2019 0911 Last data filed at 10/12/2019 1901 Gross per 24 hour  Intake 0 ml  Output 350 ml  Net -350 ml   Filed Weights   10/06/19 2015  Weight: 68 kg    Examination:  General exam: Appears calm, sitting in chair Respiratory system: Clear to auscultation. Respiratory effort normal. Cardiovascular system: S1 & S2 heard, RRR. No JVD, murmurs, rubs, gallops or clicks.  Gastrointestinal system: Abdomen is nondistended, soft and nontender.  Normal bowel sounds heard. Central nervous system at baseline dementia Extremities: No edema Skin: Warm dry Psychiatry:  Mood & affect appropriate in current setting.     Data Reviewed: I have personally reviewed following labs and imaging studies  CBC: Recent Labs  Lab 10/06/19 2012 10/07/19 0513 10/08/19 0532 10/10/19 0406 10/11/19 0508  10/12/19 0206 10/13/19 0532  WBC 9.6   < > 11.6* 11.6* 10.4 9.7 7.8  NEUTROABS 7.7  --   --   --   --   --   --   HGB 13.0   < > 11.0* 10.4* 7.7* 7.6* 7.3*  HCT 38.8   < > 32.4* 30.3* 23.7* 22.7* 23.0*  MCV 85.5   < > 85.3 84.9 88.4 86.3 90.9  PLT 171   < > 182 190 178 202 196   < > = values in this interval not displayed.   Basic Metabolic Panel: Recent Labs  Lab 10/08/19 0532 10/10/19 0406 10/11/19 0508 10/12/19 0206 10/13/19 0532  NA 136 134* 135 137 140  K 3.9 3.5 4.8 3.9 4.6  CL 99 99 103 104 107  CO2 26 25 29 26 26   GLUCOSE 111* 117* 117* 128* 97  BUN 23 24* 34* 44* 40*  CREATININE 1.00 1.02* 1.22* 1.13* 1.07*  CALCIUM 8.6* 8.3* 7.6* 7.4* 7.8*  MG 1.6* 1.6*  --  1.7 1.9  PHOS 2.6 2.5  --   --   --    GFR: Estimated Creatinine Clearance: 29.8 mL/min (A) (by C-G formula based on SCr of 1.07 mg/dL (H)). Liver Function Tests: Recent Labs  Lab 10/08/19 0532 10/10/19 0406  ALBUMIN 3.4* 3.1*   No results for input(s): LIPASE, AMYLASE in the last 168 hours. No results for input(s): AMMONIA in the last 168 hours. Coagulation Profile: No results for input(s):  INR, PROTIME in the last 168 hours. Cardiac Enzymes: Recent Labs  Lab 10/07/19 1224  CKTOTAL 159   BNP (last 3 results) No results for input(s): PROBNP in the last 8760 hours. HbA1C: No results for input(s): HGBA1C in the last 72 hours. CBG: No results for input(s): GLUCAP in the last 168 hours. Lipid Profile: No results for input(s): CHOL, HDL, LDLCALC, TRIG, CHOLHDL, LDLDIRECT in the last 72 hours. Thyroid Function Tests: No results for input(s): TSH, T4TOTAL, FREET4, T3FREE, THYROIDAB in the last 72 hours. Anemia Panel: No results for input(s): VITAMINB12, FOLATE, FERRITIN, TIBC, IRON, RETICCTPCT in the last 72 hours. Sepsis Labs: No results for input(s): PROCALCITON, LATICACIDVEN in the last 168 hours.  Recent Results (from the past 240 hour(s))  SARS Coronavirus 2 by RT PCR (hospital order,  performed in [facility] hospital lab) Nasopharyngeal Nasopharyngeal Swab     Status: None   Collection Time: 10/06/19  9:57 PM   Specimen: Nasopharyngeal Swab  Result Value Ref Range Status   SARS Coronavirus 2 NEGATIVE NEGATIVE Final    Comment: (NOTE) SARS-CoV-2 target nucleic acids are NOT DETECTED.  The SARS-CoV-2 RNA is generally detectable in upper and lower respiratory specimens during the acute phase of infection. The lowest concentration of SARS-CoV-2 viral copies this assay can detect is 250 copies / mL. A negative result does not preclude SARS-CoV-2 infection and should not be used as the sole basis for treatment or other patient management decisions.  A negative result may occur with improper specimen collection / handling, submission of specimen other than nasopharyngeal swab, presence of viral mutation(s) within the areas targeted by this assay, and inadequate number of viral copies (<250 copies / mL). A negative result must be combined with clinical observations, patient history, and epidemiological information.  Fact Sheet for Patients:   BoilerBrush.com.cy  Fact Sheet for Healthcare Providers: https://pope.com/  This test is not yet approved or  cleared by the [place] FDA and has been authorized for detection and/or diagnosis of SARS-CoV-2 by FDA under an Emergency Use Authorization (EUA).  This EUA will remain in effect (meaning this test can be used) for the duration of the COVID-19 declaration under Section 564(b)(1) of the Act, 21 U.S.C. section 360bbb-3(b)(1), unless the authorization is terminated or revoked sooner.  Performed at [facility], [address] [place], [place] [address]   MRSA PCR Screening     Status: None   Collection Time: 10/07/19  7:00 AM   Specimen: Nasopharyngeal  Result Value Ref Range Status   MRSA by PCR NEGATIVE NEGATIVE Final    Comment:        The GeneXpert  MRSA Assay (FDA approved for NASAL specimens only), is one component of a comprehensive MRSA colonization surveillance program. It is not intended to diagnose MRSA infection nor to guide or monitor treatment for MRSA infections. Performed at [facility], [address] [place], [place] [address]          Radiology Studies: DG Chest [place] 1 View  Result Date: 10/12/2019 CLINICAL DATA:  Dyspnea EXAM: PORTABLE CHEST 1 VIEW COMPARISON:  Portable exam 0805 hours compared to 10/06/2019 FINDINGS: Normal heart size, mediastinal contours, and pulmonary vascularity. Atherosclerotic calcification aorta. Low lung volumes with increased bibasilar atelectasis. Central peribronchial thickening. No infiltrate, pleural effusion or pneumothorax. Osseous demineralization with old posttraumatic deformity of proximal RIGHT humerus. IMPRESSION: Bronchitic changes with increased bibasilar atelectasis. Aortic Atherosclerosis (ICD10-I70.0). Electronically Signed   By: Ulyses Southward M.D.   On:  10/12/2019 08:23        Scheduled Meds: . amLODipine  10 mg Oral Daily  . Chlorhexidine Gluconate Cloth  6 each Topical Daily  . cholecalciferol  1,000 Units Oral Daily  . divalproex  125 mg Oral Q12H  . docusate sodium  100 mg Oral BID  . feeding supplement (ENSURE ENLIVE)  237 mL Oral TID BM  . levothyroxine  100 mcg Oral Daily  . rivaroxaban  10 mg Oral Daily  . senna  1 tablet Oral BID  . sertraline  25 mg Oral Daily   Continuous Infusions: . methocarbamol (ROBAXIN) IV      Assessment & Plan:   Active Problems:   Protein-calorie malnutrition, severe   Hip fracture (HCC)   Fall   DNR (do not resuscitate)   Goals of care, counseling/discussion   Advanced care planning/counseling discussion   Palliative care by specialist   Acute hypoxic respiratory failure initially on 7 L high flow nasal cannula -Suspect volume overload?. bnp mildly elevated.  02 weaned now to 3 L with continue  weaining. ivf stopped  Urinary retention- bladder scan >500, will place foley Also start bowel regimen to help with this.  Unwitnessed fall at nursing facility Acute right femoral intratrochanteric fracture Status post intramedullary fixation of the right hip 8/29 Ortho following SNF pending. Pain control Bowel regimen  Vitamin D deficiency-level 15.84 Continue vitamin D supplementation  Acute blood loss anemia -Baseline hemoglobin 10.0, today 7.3.   Likely postop bleed Continue to monitor Transfuse if hemoglobin less than 7  ZYS:[id number]. Continue to monitor  Essential hypertension: -Continue Norvasc   Hyperglycemia without diagnosis of diabetes: A1c 5.8%. Kos now stable continue to monitor    Anxiety/depression: Stable Continue home meds  Dementia without behavioral disturbance On delirium precautions needs reorientation  Severe protein calorie malnutrition: Encourage oral intake  Hypothyroidism -Continue home Synthroid  History of superficial DVT on low-dose Xarelto -Resume anticoagulation per surgery.  On Xarelto   GOC/DNR/DNI-appropriate. -Palliative care consulted. Appreciate input.     DVT prophylaxis: Xarelto Code Status: DNR Family Communication: Son at bedside updated by me Status is: Inpatient  Remains inpatient appropriate because:Inpatient level of care appropriate due to severity of illness   Dispo: The patient is from: Home              Anticipated d/c is to: SNF              Anticipated d/c date is: 2 days              Patient currently is not medically stable to d/c.Still requiring 3L of oxygen. Now also with urinary retention. No Bm. Need hypoxia to improve.            LOS: 7 days   Time spent:35 min with >50% on coc    Lynn Ito, MD [facility] Pager [phone]  If 7PM-7AM, please contact night-coverage [email or web address] Password [email or web address] 10/13/2019, 9:11 AM

## 2019-10-14 DIAGNOSIS — J9601 Acute respiratory failure with hypoxia: Secondary | ICD-10-CM

## 2019-10-14 LAB — CBC
HCT: 24.2 % — ABNORMAL LOW (ref 36.0–46.0)
Hemoglobin: 8.1 g/dL — ABNORMAL LOW (ref 12.0–15.0)
MCH: 29.2 pg (ref 26.0–34.0)
MCHC: 33.5 g/dL (ref 30.0–36.0)
MCV: 87.4 fL (ref 80.0–100.0)
Platelets: 197 10*3/uL (ref 150–400)
RBC: 2.77 MIL/uL — ABNORMAL LOW (ref 3.87–5.11)
RDW: 14.9 % (ref 11.5–15.5)
WBC: 8.2 10*3/uL (ref 4.0–10.5)
nRBC: 0 % (ref 0.0–0.2)

## 2019-10-14 LAB — BASIC METABOLIC PANEL
Anion gap: 5 (ref 5–15)
BUN: 35 mg/dL — ABNORMAL HIGH (ref 8–23)
CO2: 28 mmol/L (ref 22–32)
Calcium: 8.1 mg/dL — ABNORMAL LOW (ref 8.9–10.3)
Chloride: 107 mmol/L (ref 98–111)
Creatinine, Ser: 0.74 mg/dL (ref 0.44–1.00)
GFR calc Af Amer: >60 mL/min (ref >60)
GFR calc non Af Amer: >60 mL/min (ref >60)
Glucose, Bld: 117 mg/dL — ABNORMAL HIGH (ref 70–99)
Potassium: 4.3 mmol/L (ref 3.5–5.1)
Sodium: 140 mmol/L (ref 135–145)

## 2019-10-14 LAB — MAGNESIUM: Magnesium: 1.7 mg/dL (ref 1.7–2.4)

## 2019-10-14 LAB — SARS CORONAVIRUS 2 BY RT PCR (HOSPITAL ORDER, PERFORMED IN ~~LOC~~ HOSPITAL LAB): SARS Coronavirus 2: NEGATIVE

## 2019-10-14 MED ORDER — POLYETHYLENE GLYCOL 3350 17 G PO PACK: 17.0000 g | PACK | Freq: Every day | ORAL | 0 refills | Status: AC | PRN

## 2019-10-14 MED ORDER — MAGNESIUM SULFATE 2 GM/50ML IV SOLN
2.0000 g | Freq: Once | INTRAVENOUS | Status: AC
  Administered 2019-10-14: 2 g via INTRAVENOUS
  Filled 2019-10-14: qty 50

## 2019-10-14 MED ORDER — AMLODIPINE BESYLATE 10 MG PO TABS: 10.0000 mg | ORAL_TABLET | Freq: Every day | ORAL | Status: AC

## 2019-10-14 MED ORDER — HYDROCODONE-ACETAMINOPHEN 7.5-325 MG PO TABS
1.0000 | ORAL_TABLET | ORAL | 0 refills | Status: AC | PRN
Start: 2019-10-14 — End: 2019-10-17

## 2019-10-14 MED ORDER — DOCUSATE SODIUM 100 MG PO CAPS: 100.0000 mg | ORAL_CAPSULE | Freq: Two times a day (BID) | ORAL | 0 refills | Status: AC

## 2019-10-14 MED ORDER — VITAMIN D3 25 MCG PO TABS: 1000.0000 [IU] | ORAL_TABLET | Freq: Every day | ORAL | Status: AC

## 2019-10-14 MED ORDER — HYDROCODONE-ACETAMINOPHEN 7.5-325 MG PO TABS
1.0000 | ORAL_TABLET | ORAL | 0 refills | Status: DC | PRN
Start: 2019-10-14 — End: 2019-10-14

## 2019-10-14 MED ORDER — METHOCARBAMOL 500 MG PO TABS: 500.0000 mg | ORAL_TABLET | Freq: Four times a day (QID) | ORAL | Status: AC | PRN

## 2019-10-14 MED ORDER — ENSURE ENLIVE PO LIQD: 237.0000 mL | Freq: Three times a day (TID) | ORAL | 12 refills | Status: AC

## 2019-10-14 NOTE — Progress Notes (Signed)
Subjective:  POD #/4 s/p intramedullary fixation for right intertrochanteric hip fracture.   Patient was seen by me this afternoon as well as yesterday. I realized today that a note was not placed in the chart but the patient was seen yesterday. She was up out of bed to a chair at the time in no acute distress. I had requested the nurse change her proximal dressing. Patient has underlying dementia. She is not able to provide an accurate history but denies significant pain to the right hip. She states that he is experiencing aching in the right hip. Patient is lying in bed this evening at the time of my examination.  Objective:   VITALS:   Vitals:   10/13/19 1638 10/13/19 2321 10/14/19 0811 10/14/19 1608  BP: (!) 125/51 (!) 141/63 (!) 134/55 (!) 155/63  Pulse: 79 81 70 76  Resp: 17 16 18 17   Temp: 98.6 F (37 C) 98.9 F (37.2 C) 99 F (37.2 C) 98.2 F (36.8 C)  TempSrc: Oral Oral Oral Oral  SpO2: 93% 91% 96% 96%  Weight:      Height:        PHYSICAL EXAM: Right lower extremity: Patient's proximal dressing was changed yesterday by the nurse. Patient has mild serosanguineous drainage in the proximal dressing but minimal drainage in the inferior 2 dressings. Patient's thigh remains soft and compressible. Distally she has palpable pedal pulses. The patient states she can feel me touching her foot and she can flex and extend her toes and dorsiflex and plantarflex her ankle. This examination is consistent with my examination yesterday.   LABS  Results for orders placed or performed during the hospital encounter of 10/06/19 (from the past 24 hour(s))  Basic metabolic panel     Status: Abnormal   Collection Time: 10/14/19  3:46 AM  Result Value Ref Range   Sodium 140 135 - 145 mmol/L   Potassium 4.3 3.5 - 5.1 mmol/L   Chloride 107 98 - 111 mmol/L   CO2 28 22 - 32 mmol/L   Glucose, Bld 117 (H) 70 - 99 mg/dL   BUN 35 (H) 8 - 23 mg/dL   Creatinine, Ser 12/14/19 0.44 - 1.00 mg/dL   Calcium  8.1 (L) 8.9 - 10.3 mg/dL   GFR calc non Af Amer >60 >60 mL/min   GFR calc Af Amer >60 >60 mL/min   Anion gap 5 5 - 15  CBC     Status: Abnormal   Collection Time: 10/14/19  3:46 AM  Result Value Ref Range   WBC 8.2 4.0 - 10.5 K/uL   RBC 2.77 (L) 3.87 - 5.11 MIL/uL   Hemoglobin 8.1 (L) 12.0 - 15.0 g/dL   HCT 12/14/19 (L) 36 - 46 %   MCV 87.4 80.0 - 100.0 fL   MCH 29.2 26.0 - 34.0 pg   MCHC 33.5 30.0 - 36.0 g/dL   RDW 14.7 82.9 - 56.2 %   Platelets 197 150 - 400 K/uL   nRBC 0.0 0.0 - 0.2 %  Magnesium     Status: None   Collection Time: 10/14/19  3:46 AM  Result Value Ref Range   Magnesium 1.7 1.7 - 2.4 mg/dL  SARS Coronavirus 2 by RT PCR (hospital order, performed in [facility] hospital lab) Nasopharyngeal Nasopharyngeal Swab     Status: None   Collection Time: 10/14/19 12:35 PM   Specimen: Nasopharyngeal Swab  Result Value Ref Range   SARS Coronavirus 2 NEGATIVE NEGATIVE    No results  found.  Assessment/Plan: 4 Days Post-Op   Active Problems:   Protein-calorie malnutrition, severe   Hip fracture (HCC)   Fall   DNR (do not resuscitate)   Goals of care, counseling/discussion   Advanced care planning/counseling discussion   Palliative care by specialist  Patient stable from an orthopedic standpoint. She is being discharged to SNF today. Patient will follow up with me in the office in 10 to 14 days. Patient will continue physical therapy at SNF. She is weightbearing as tolerated on the right lower extremity. She will remain on her baseline Xarelto which should cover her for DVT prophylaxis.   Juanell Fairly , MD 10/14/2019, 5:12 PM

## 2019-10-14 NOTE — Care Management Important Message (Signed)
Important Message  Patient Details  Name: Christina Wise MRN: [id number] Date of Birth: [birth year]   Medicare Important Message Given:  Yes     [name] 10/14/2019, 1:46 PM

## 2019-10-14 NOTE — Progress Notes (Signed)
EMS here to transport pt, pt on 3L oxygen, foley in place. Son Christina Wise notified.

## 2019-10-14 NOTE — Discharge Summary (Signed)
Christina Wise:[id number] DOB: 01/24/1922 DOA: 10/06/2019  PCP: Patient, No Pcp Per  Admit date: 10/06/2019 Discharge date: 10/14/2019  Admitted From: SNF Disposition: SNF  Recommendations for Outpatient Follow-up:  1. Follow up with PCP in 1 week 2. Please obtain BMP/CBC in one week 3. Dr. Martha Clan orthopedics in 1 week     Discharge Condition:Stable CODE STATUS: DNR Diet recommendation: Heart Healthy  Brief/Interim Summary: Per H&P Christina Wise is a 86 y.o. female with history of dementia, CKD, hypothyroidism, hypertension, who presents after a fall. Patient presents from nursing home after unwitnessed fall.  Pleasantly demented and unable to provide any significant history.  Patient was found with right femoral intertrochanteric fracture.  Orthopedics was consulted.  Intramedullary fixation and had postop   Acute hypoxic respiratory failure initially on 7 L high flow nasal cannula -Suspect volume overload?. bnp mildly elevated.  02 weaned now to 3 L with continue weaining.will need to slowly be weaned off of oxygen as tolerated at SNF.   Urinary retention- bladder scan >500, replaced Foley.  Continue bowel regimen.  And in 3 days at the facility try removing the Foley to see if patient able to void   Unwitnessed fall at nursing facility Acute right femoral intratrochanteric fracture Status post intramedullary fixation of the right hip 8/29 Follow-up with Dr. Martha Clan in 1 week Pain control Bowel regimen  Vitamin D deficiency-level 15.84 Continue vitamin D supplementation  Acute blood loss anemia -Baseline hemoglobin 10.0, today 8.2 Likely postop bleed Continue to monitor Needs CBC within 1 week  QPY:[id number]. Continue to monitor  Essential hypertension: -Continue Norvasc   Hyperglycemia without diagnosis of diabetes: A1c 5.8%.  now stable continue to monitor    Anxiety/depression: Stable Continue home meds  Dementia  without behavioral disturbance On delirium precautions needs reorientation at times  Severe protein calorie malnutrition: Encourage oral intake Protein shakes  Hypothyroidism -Continue home Synthroid  History of superficial DVT on low-dose Xarelto   On Xarelto   GOC/DNR/DNI-appropriate. -Palliative care consulted.  Was trying to discuss with son   Discharge Diagnoses:  Active Problems:   Protein-calorie malnutrition, severe   Hip fracture (HCC)   Fall   DNR (do not resuscitate)   Goals of care, counseling/discussion   Advanced care planning/counseling discussion   Palliative care by specialist    Discharge Instructions  Discharge Instructions     Remove dressing in 72 hours   Complete by: As directed    Call MD for:   Complete by: As directed    Diet - low sodium heart healthy   Complete by: As directed    Increase activity slowly   Complete by: As directed      Allergies as of 10/14/2019      Reactions   Alendronate Other (See Comments)   GI upset   Codeine Nausea And Vomiting   Risedronate Other (See Comments)   GI Upset      Medication List    TAKE these medications   acetaminophen 500 MG tablet Commonly known as: TYLENOL Take 500 mg by mouth in the morning, at noon, and at bedtime.   ALPRAZolam 0.5 MG tablet Commonly known as: XANAX Take 0.5 mg by mouth 2 (two) times daily.   amLODipine 10 MG tablet Commonly known as: NORVASC Take 1 tablet (10 mg total) by mouth daily. Start taking on: October 15, 2019 What changed:   medication strength  how much to take   divalproex 125 MG DR tablet Commonly known as:  DEPAKOTE Take 125 mg by mouth 2 (two) times daily.   docusate sodium 100 MG capsule Commonly known as: COLACE Take 1 capsule (100 mg total) by mouth 2 (two) times daily.   feeding supplement (ENSURE ENLIVE) Liqd Take 237 mLs by mouth 3 (three) times daily between meals.   Glucosamine-Chondroitin 250-200 MG Tabs Take 1  tablet by mouth daily.   HYDROcodone-acetaminophen 7.5-325 MG tablet Commonly known as: NORCO Take 1-2 tablets by mouth every 4 (four) hours as needed for up to 3 days for severe pain (pain score 7-10).   levothyroxine 100 MCG tablet Commonly known as: SYNTHROID Take 100 mcg by mouth daily.   methocarbamol 500 MG tablet Commonly known as: ROBAXIN Take 1 tablet (500 mg total) by mouth every 6 (six) hours as needed for muscle spasms.   polyethylene glycol 17 g packet Commonly known as: MIRALAX / GLYCOLAX Take 17 g by mouth daily as needed for mild constipation.   sennosides-docusate sodium 8.6-50 MG tablet Commonly known as: SENOKOT-S Take 1 tablet by mouth daily.   sertraline 25 MG tablet Commonly known as: ZOLOFT Take 25 mg by mouth daily.   Vitamin D3 25 MCG tablet Commonly known as: Vitamin D Take 1 tablet (1,000 Units total) by mouth daily. Start taking on: October 15, 2019   Xarelto 10 MG Tabs tablet Generic drug: rivaroxaban Take 10 mg by mouth daily.       Contact information for follow-up providers    Juanell Fairly, MD Follow up in 1 week(s).   Specialty: Orthopedic Surgery Contact information: [address] [place] [address] [phone]            Contact information for after-discharge care    Destination    [facility] .   Service: Skilled Nursing Contact information: [address] [place] [address] [phone]                 Allergies  Allergen Reactions  . Alendronate Other (See Comments)    GI upset  . Codeine Nausea And Vomiting  . Risedronate Other (See Comments)    GI Upset    Consultations:  Orthopedics palliative care   Procedures/Studies: DG Chest 1 View  Result Date: 10/06/2019 CLINICAL DATA:  Larey Seat.  Shortened right leg. EXAM: CHEST  1 VIEW COMPARISON:  05/01/2018 FINDINGS: Heart size is normal. Aortic atherosclerosis. Chronic interstitial lung  markings. No evidence of active infiltrate, collapse, edema or effusion. No acute chest region bone finding. IMPRESSION: No active disease. Chronic interstitial lung markings. Electronically Signed   By: Paulina Fusi M.D.   On: 10/06/2019 21:11   CT Head Wo Contrast  Result Date: 10/06/2019 CLINICAL DATA:  Dementia with fall EXAM: CT HEAD WITHOUT CONTRAST CT CERVICAL SPINE WITHOUT CONTRAST TECHNIQUE: Multidetector CT imaging of the head and cervical spine was performed following the standard protocol without intravenous contrast. Multiplanar CT image reconstructions of the cervical spine were also generated. COMPARISON:  CT brain 05/01/2018 FINDINGS: CT HEAD FINDINGS Brain: No acute territorial infarction, hemorrhage, or intracranial mass. Moderate atrophy. Mild hypodensity in the white matter consistent with chronic small vessel ischemic change. Stable ventricle size. Vascular: No hyperdense vessels.  Carotid vascular calcification Skull: Normal. Negative for fracture or focal lesion. Sinuses/Orbits: No acute finding. Other: None CT CERVICAL SPINE FINDINGS Alignment: Reversal of cervical lordosis. Trace anterolisthesis C3 on C4. Facet alignment is maintained. Skull base and vertebrae: No acute fracture. No primary bone lesion or focal pathologic process. Soft  tissues and spinal canal: No prevertebral fluid or swelling. No visible canal hematoma. Disc levels: Diffuse degenerative changes with advanced disease at C5-C6 and C6-C7. Facet degenerative changes at multiple levels with bilateral foraminal narrowing Upper chest: Negative. Other: None IMPRESSION: 1. No CT evidence for acute intracranial abnormality. Atrophy and mild chronic small vessel ischemic changes of the white matter. 2. Reversal of cervical lordosis with degenerative changes. No acute osseous abnormality. Electronically Signed   By: Jasmine Pang M.D.   On: 10/06/2019 21:03   CT Cervical Spine Wo Contrast  Result Date: 10/06/2019 CLINICAL  DATA:  Dementia with fall EXAM: CT HEAD WITHOUT CONTRAST CT CERVICAL SPINE WITHOUT CONTRAST TECHNIQUE: Multidetector CT imaging of the head and cervical spine was performed following the standard protocol without intravenous contrast. Multiplanar CT image reconstructions of the cervical spine were also generated. COMPARISON:  CT brain 05/01/2018 FINDINGS: CT HEAD FINDINGS Brain: No acute territorial infarction, hemorrhage, or intracranial mass. Moderate atrophy. Mild hypodensity in the white matter consistent with chronic small vessel ischemic change. Stable ventricle size. Vascular: No hyperdense vessels.  Carotid vascular calcification Skull: Normal. Negative for fracture or focal lesion. Sinuses/Orbits: No acute finding. Other: None CT CERVICAL SPINE FINDINGS Alignment: Reversal of cervical lordosis. Trace anterolisthesis C3 on C4. Facet alignment is maintained. Skull base and vertebrae: No acute fracture. No primary bone lesion or focal pathologic process. Soft tissues and spinal canal: No prevertebral fluid or swelling. No visible canal hematoma. Disc levels: Diffuse degenerative changes with advanced disease at C5-C6 and C6-C7. Facet degenerative changes at multiple levels with bilateral foraminal narrowing Upper chest: Negative. Other: None IMPRESSION: 1. No CT evidence for acute intracranial abnormality. Atrophy and mild chronic small vessel ischemic changes of the white matter. 2. Reversal of cervical lordosis with degenerative changes. No acute osseous abnormality. Electronically Signed   By: Jasmine Pang M.D.   On: 10/06/2019 21:03   DG Chest Port 1 View  Result Date: 10/12/2019 CLINICAL DATA:  Dyspnea EXAM: PORTABLE CHEST 1 VIEW COMPARISON:  Portable exam 0805 hours compared to 10/06/2019 FINDINGS: Normal heart size, mediastinal contours, and pulmonary vascularity. Atherosclerotic calcification aorta. Low lung volumes with increased bibasilar atelectasis. Central peribronchial thickening. No  infiltrate, pleural effusion or pneumothorax. Osseous demineralization with old posttraumatic deformity of proximal RIGHT humerus. IMPRESSION: Bronchitic changes with increased bibasilar atelectasis. Aortic Atherosclerosis (ICD10-I70.0). Electronically Signed   By: Ulyses Southward M.D.   On: 10/12/2019 08:23   DG HIP OPERATIVE UNILAT W OR W/O PELVIS RIGHT  Result Date: 10/10/2019 CLINICAL DATA:  RIGHT hip fracture EXAM: OPERATIVE RIGHT HIP (WITH PELVIS IF PERFORMED) for VIEWS TECHNIQUE: Fluoroscopic spot image(s) were submitted for interpretation post-operatively. COMPARISON:  10/06/2019 FINDINGS: Osseous demineralization. IM nail with compression screw placed across reduced intertrochanteric fracture of RIGHT femur. Two distal locking screws noted. No additional fracture, dislocation or bone destruction. IMPRESSION: Post ORIF of intertrochanteric fracture RIGHT femur. Electronically Signed   By: Ulyses Southward M.D.   On: 10/10/2019 12:06   DG Hip Unilat W or Wo Pelvis 2-3 Views Right  Result Date: 10/06/2019 CLINICAL DATA:  Fall, right leg pain EXAM: DG HIP (WITH OR WITHOUT PELVIS) 2-3V RIGHT COMPARISON:  None. FINDINGS: There is a right femoral intertrochanteric fracture. No significant angulation. No subluxation or dislocation. Age-indeterminate right inferior pubic ramus fracture noted. IMPRESSION: Right femoral intertrochanteric fracture. Age indeterminate right inferior pubic ramus fracture. Electronically Signed   By: Charlett Nose M.D.   On: 10/06/2019 21:12   DG FEMUR, MIN 2 VIEWS RIGHT  Result Date: 10/10/2019 CLINICAL DATA:  Post RIGHT hip surgery EXAM: RIGHT FEMUR 2 VIEWS COMPARISON:  Portable exam 1116 hours compared to intraoperative images of 10/10/2019 FINDINGS: IM nail with compression screw identified at proximal RIGHT femur post ORIF of an intertrochanteric fracture. Two distal locking screws. Osseous demineralization. No additional fracture or dislocation. Degenerative changes and question  CPPD RIGHT knee. IMPRESSION: Post ORIF of intertrochanteric fracture RIGHT femur. Electronically Signed   By: Ulyses SouthwardMark  Boles M.D.   On: 10/10/2019 12:05       Subjective: No complaints or issues this a.m.  Discharge Exam: Vitals:   10/13/19 2321 10/14/19 0811  BP: (!) 141/63 (!) 134/55  Pulse: 81 70  Resp: 16 18  Temp: 98.9 F (37.2 C) 99 F (37.2 C)  SpO2: 91% 96%   Vitals:   10/13/19 0810 10/13/19 1638 10/13/19 2321 10/14/19 0811  BP: (!) 149/56 (!) 125/51 (!) 141/63 (!) 134/55  Pulse: 77 79 81 70  Resp: 17 17 16 18   Temp: 98 F (36.7 C) 98.6 F (37 C) 98.9 F (37.2 C) 99 F (37.2 C)  TempSrc: Oral Oral Oral Oral  SpO2: 94% 93% 91% 96%  Weight:      Height:        General: Pt is  awake, not in acute distress Cardiovascular: RRR, S1/S2 +, no rubs, no gallops Respiratory: CTA bilaterally, no wheezing, no rhonchi Abdominal: Soft, NT, ND, bowel sounds + Extremities: no edema, no cyanosis    The results of significant diagnostics from this hospitalization (including imaging, microbiology, ancillary and laboratory) are listed below for reference.     Microbiology: Recent Results (from the past 240 hour(s))  SARS Coronavirus 2 by RT PCR (hospital order, performed in [facility] hospital lab) Nasopharyngeal Nasopharyngeal Swab     Status: None   Collection Time: 10/06/19  9:57 PM   Specimen: Nasopharyngeal Swab  Result Value Ref Range Status   SARS Coronavirus 2 NEGATIVE NEGATIVE Final    Comment: (NOTE) SARS-CoV-2 target nucleic acids are NOT DETECTED.  The SARS-CoV-2 RNA is generally detectable in upper and lower respiratory specimens during the acute phase of infection. The lowest concentration of SARS-CoV-2 viral copies this assay can detect is 250 copies / mL. A negative result does not preclude SARS-CoV-2 infection and should not be used as the sole basis for treatment or other patient management decisions.  A negative result may occur with improper  specimen collection / handling, submission of specimen other than nasopharyngeal swab, presence of viral mutation(s) within the areas targeted by this assay, and inadequate number of viral copies (<250 copies / mL). A negative result must be combined with clinical observations, patient history, and epidemiological information.  Fact Sheet for Patients:   BoilerBrush.com.cyhttps://www.fda.gov/media/136312/download  Fact Sheet for Healthcare Providers: https://pope.com/https://www.fda.gov/media/136313/download  This test is not yet approved or  cleared by the [place] FDA and has been authorized for detection and/or diagnosis of SARS-CoV-2 by FDA under an Emergency Use Authorization (EUA).  This EUA will remain in effect (meaning this test can be used) for the duration of the COVID-19 declaration under Section 564(b)(1) of the Act, 21 U.S.C. section 360bbb-3(b)(1), unless the authorization is terminated or revoked sooner.  Performed at [facility], [address] [place], [place] [id number]   MRSA PCR Screening     Status: None   Collection Time: 10/07/19  7:00 AM   Specimen: Nasopharyngeal  Result Value Ref Range Status   MRSA by PCR NEGATIVE NEGATIVE Final  Comment:        The GeneXpert MRSA Assay (FDA approved for NASAL specimens only), is one component of a comprehensive MRSA colonization surveillance program. It is not intended to diagnose MRSA infection nor to guide or monitor treatment for MRSA infections. Performed at [facility], [address] [place], [place] [address]   SARS Coronavirus 2 by RT PCR (hospital order, performed in [facility] hospital lab) Nasopharyngeal Nasopharyngeal Swab     Status: None   Collection Time: 10/14/19 12:35 PM   Specimen: Nasopharyngeal Swab  Result Value Ref Range Status   SARS Coronavirus 2 NEGATIVE NEGATIVE Final    Comment: (NOTE) SARS-CoV-2 target nucleic acids are NOT DETECTED.  The SARS-CoV-2 RNA is generally detectable in  upper and lower respiratory specimens during the acute phase of infection. The lowest concentration of SARS-CoV-2 viral copies this assay can detect is 250 copies / mL. A negative result does not preclude SARS-CoV-2 infection and should not be used as the sole basis for treatment or other patient management decisions.  A negative result may occur with improper specimen collection / handling, submission of specimen other than nasopharyngeal swab, presence of viral mutation(s) within the areas targeted by this assay, and inadequate number of viral copies (<250 copies / mL). A negative result must be combined with clinical observations, patient history, and epidemiological information.  Fact Sheet for Patients:   BoilerBrush.com.cy  Fact Sheet for Healthcare Providers: https://pope.com/  This test is not yet approved or  cleared by the [place] FDA and has been authorized for detection and/or diagnosis of SARS-CoV-2 by FDA under an Emergency Use Authorization (EUA).  This EUA will remain in effect (meaning this test can be used) for the duration of the COVID-19 declaration under Section 564(b)(1) of the Act, 21 U.S.C. section 360bbb-3(b)(1), unless the authorization is terminated or revoked sooner.  Performed at [facility], [address] [place], [place] [address]      Labs: BNP (last 3 results) Recent Labs    10/12/19 0812  BNP 215.7*   Basic Metabolic Panel: Recent Labs  Lab 10/08/19 0532 10/08/19 0532 10/10/19 0406 10/11/19 0508 10/12/19 0206 10/13/19 0532 10/14/19 0346  NA 136   < > 134* 135 137 140 140  K 3.9   < > 3.5 4.8 3.9 4.6 4.3  CL 99   < > 99 103 104 107 107  CO2 26   < > GLUCOSE 111*   < > 117* 117* 128* 97 117*  BUN 23   < > 24* 34* 44* 40* 35*  CREATININE 1.00   < > 1.02* 1.22* 1.13* 1.07* 0.74  CALCIUM 8.6*   < > 8.3* 7.6* 7.4* 7.8* 8.1*  MG 1.6*  --  1.6*  --  1.7  1.9 1.7  PHOS 2.6  --  2.5  --   --   --   --    < > = values in this interval not displayed.   Liver Function Tests: Recent Labs  Lab 10/08/19 0532 10/10/19 0406  ALBUMIN 3.4* 3.1*   No results for input(s): LIPASE, AMYLASE in the last 168 hours. No results for input(s): AMMONIA in the last 168 hours. CBC: Recent Labs  Lab 10/10/19 0406 10/11/19 0508 10/12/19 0206 10/13/19 0532 10/14/19 0346  WBC 11.6* 10.4 9.7 7.8 8.2  HGB 10.4* 7.7* 7.6* 7.3* 8.1*  HCT 30.3* 23.7* 22.7* 23.0* 24.2*  MCV 84.9 88.4 86.3 90.9 87.4  PLT 190 178 202 196 197   Cardiac Enzymes: No results for input(s): CKTOTAL, CKMB, CKMBINDEX, TROPONINI in the last 168 hours. BNP: Invalid input(s): POCBNP CBG: No results for input(s): GLUCAP in the last 168 hours. D-Dimer No results for input(s): DDIMER in the last 72 hours. Hgb A1c No results for input(s): HGBA1C in the last 72 hours. Lipid Profile No results for input(s): CHOL, HDL, LDLCALC, TRIG, CHOLHDL, LDLDIRECT in the last 72 hours. Thyroid function studies No results for input(s): TSH, T4TOTAL, T3FREE, THYROIDAB in the last 72 hours.  Invalid input(s): FREET3 Anemia work up No results for input(s): VITAMINB12, FOLATE, FERRITIN, TIBC, IRON, RETICCTPCT in the last 72 hours. Urinalysis    Component Value Date/Time   COLORURINE YELLOW (A) 05/01/2018 1616   APPEARANCEUR CLEAR (A) 05/01/2018 1616   LABSPEC 1.010 05/01/2018 1616   PHURINE 7.0 05/01/2018 1616   GLUCOSEU NEGATIVE 05/01/2018 1616   HGBUR NEGATIVE 05/01/2018 1616   BILIRUBINUR NEGATIVE 05/01/2018 1616   KETONESUR 5 (A) 05/01/2018 1616   PROTEINUR NEGATIVE 05/01/2018 1616   NITRITE NEGATIVE 05/01/2018 1616   LEUKOCYTESUR NEGATIVE 05/01/2018 1616   Sepsis Labs Invalid input(s): PROCALCITONIN,  WBC,  LACTICIDVEN Microbiology Recent Results (from the past 240 hour(s))  SARS Coronavirus 2 by RT PCR (hospital order, performed in [facility] hospital lab) Nasopharyngeal  Nasopharyngeal Swab     Status: None   Collection Time: 10/06/19  9:57 PM   Specimen: Nasopharyngeal Swab  Result Value Ref Range Status   SARS Coronavirus 2 NEGATIVE NEGATIVE Final    Comment: (NOTE) SARS-CoV-2 target nucleic acids are NOT DETECTED.  The SARS-CoV-2 RNA is generally detectable in upper and lower respiratory specimens during the acute phase of infection. The lowest concentration of SARS-CoV-2 viral copies this assay can detect is 250 copies / mL. A negative result does not preclude SARS-CoV-2 infection and should not be used as the sole basis for treatment or other patient management decisions.  A negative result may occur with improper specimen collection / handling, submission of specimen other than nasopharyngeal swab, presence of viral mutation(s) within the areas targeted by this assay, and inadequate number of viral copies (<250 copies / mL). A negative result must be combined with clinical observations, patient history, and epidemiological information.  Fact Sheet for Patients:   BoilerBrush.com.cy  Fact Sheet for Healthcare Providers: https://pope.com/  This test is not yet approved or  cleared by the [place] FDA and has been authorized for detection and/or diagnosis of SARS-CoV-2 by FDA under an Emergency Use Authorization (EUA).  This EUA will remain in effect (meaning this test can be used) for the duration of the COVID-19 declaration under Section 564(b)(1) of the Act, 21 U.S.C. section 360bbb-3(b)(1), unless the authorization is terminated or revoked sooner.  Performed at [facility], [address] [place], [place] [address]   MRSA PCR Screening     Status: None   Collection Time: 10/07/19  7:00 AM   Specimen: Nasopharyngeal  Result Value Ref Range Status   MRSA by PCR NEGATIVE NEGATIVE Final    Comment:        The GeneXpert MRSA Assay (FDA approved for NASAL specimens only), is  one component of a comprehensive MRSA colonization surveillance program. It is not intended to diagnose MRSA infection nor to guide or monitor treatment for MRSA infections. Performed at [facility], [address] [place], [place] [address]   SARS Coronavirus 2 by RT PCR (hospital order, performed in [facility] hospital  lab) Nasopharyngeal Nasopharyngeal Swab     Status: None   Collection Time: 10/14/19 12:35 PM   Specimen: Nasopharyngeal Swab  Result Value Ref Range Status   SARS Coronavirus 2 NEGATIVE NEGATIVE Final    Comment: (NOTE) SARS-CoV-2 target nucleic acids are NOT DETECTED.  The SARS-CoV-2 RNA is generally detectable in upper and lower respiratory specimens during the acute phase of infection. The lowest concentration of SARS-CoV-2 viral copies this assay can detect is 250 copies / mL. A negative result does not preclude SARS-CoV-2 infection and should not be used as the sole basis for treatment or other patient management decisions.  A negative result may occur with improper specimen collection / handling, submission of specimen other than nasopharyngeal swab, presence of viral mutation(s) within the areas targeted by this assay, and inadequate number of viral copies (<250 copies / mL). A negative result must be combined with clinical observations, patient history, and epidemiological information.  Fact Sheet for Patients:   BoilerBrush.com.cy  Fact Sheet for Healthcare Providers: https://pope.com/  This test is not yet approved or  cleared by the [place] FDA and has been authorized for detection and/or diagnosis of SARS-CoV-2 by FDA under an Emergency Use Authorization (EUA).  This EUA will remain in effect (meaning this test can be used) for the duration of the COVID-19 declaration under Section 564(b)(1) of the Act, 21 U.S.C. section 360bbb-3(b)(1), unless the authorization is terminated  or revoked sooner.  Performed at [facility], [address] [place], [place] [address]      Time coordinating discharge: Over 30 minutes  SIGNED:   Lynn Ito, MD  [facility] 10/14/2019, 2:44 PM Pager   If 7PM-7AM, please contact night-coverage [email or web address] Password [email or web address]

## 2019-10-14 NOTE — Progress Notes (Signed)
Report called to [facility] [employer].

## 2019-10-14 NOTE — TOC Progression Note (Signed)
Transition of Care [facility]) - Progression Note    Patient Details  Name: Christina Wise MRN: [id number] Date of Birth: June 11, 1921  [facility]) CM/SW Contact  Barrie Dunker, RN Phone Number: 10/14/2019, 2:54 PM  Clinical Narrative: called and let Verlon Au know that the patient will be returning today, Sent the DC Summary and orders thru the Hub, DC packet placed on the chart, Nurse aware to call report,             Expected Discharge Plan and Services           Expected Discharge Date: 10/14/19                                     Social Determinants of Health (SDOH) Interventions    Readmission Risk Interventions No flowsheet data found.

## 2019-10-14 NOTE — Progress Notes (Signed)
Physical Therapy Treatment Patient Details Name: Christina Wise MRN: [id number] DOB: May 29, 1921 Today's Date: 10/14/2019    History of Present Illness Pt is a 86 y.o. female presenting to hospital 8/25 with R hip pain s/p fall.  Imaging showing R femoral intertrochanteric fx; age indeterminate R inferior pubic ramus fx.  Delayed surgical repair d/t pt fully anticoagulated.  Pt s/p IM fixation for R intertrochanteric hip fx 8/29.  PMH includes hearing impairment, htn, mild dementia, thyroid disease, post-phlebitic syndrome.    PT Comments    Pt was supine in bed with HOB elevated ~ 20 degrees. Tearful 2/2 to confused/disoriented. Pt has baseline cogntiion deficits. Therapist reoriented pt and pt seemed less anxious afterwards. She endorses R LE pain with movements but unable to rate. RN assisted therapist will cleaning pt after have loose BM. Min assist to roll R/L to be cleaned. Due to pain, therapist elected pt not perform OOB activity. She did perform several exercises while in bed. See there ex performed listed below. Pt is in bed with call bell in reach, chair alarm in place, and tray table in reach.     Follow Up Recommendations  SNF     Equipment Recommendations  Rolling walker with 5" wheels;3in1 (PT)    Recommendations for Other Services       Precautions / Restrictions Precautions Precautions: Fall Precaution Comments: Aspiration Restrictions Weight Bearing Restrictions: Yes RLE Weight Bearing: Weight bearing as tolerated    Mobility  Bed Mobility Overal bed mobility: Needs Assistance Bed Mobility: Rolling Rolling: Min assist         General bed mobility comments: Min assist to roll L and R to be cleaned from BM.    General transfer comment: Did not perform OOB. pt to be DC to SNF per rounds. Pt endorses pain with all movements.         Cognition Arousal/Alertness: Awake/alert Behavior During Therapy: WFL for tasks assessed/performed Overall Cognitive  Status: History of cognitive impairments - at baseline        General Comments: Pt is alert but disoriented. was able to follow commands      Exercises General Exercises - Lower Extremity Ankle Circles/Pumps: AROM;Both;10 reps Quad Sets: AROM;Both;10 reps Gluteal Sets: AROM;10 reps Heel Slides: AROM;10 reps Hip ABduction/ADduction: AROM;10 reps Straight Leg Raises: AAROM;10 reps        Pertinent Vitals/Pain Pain Assessment: 0-10 Pain Location: R hip/LE Pain Descriptors / Indicators: Guarding;Grimacing;Sore Pain Intervention(s): Limited activity within patient's tolerance;Monitored during session;Premedicated before session;Repositioned    Home Living   Prior Function    PT Goals (current goals can now be found in the care plan section) Acute Rehab PT Goals Patient Stated Goal: none stated Progress towards PT goals: Progressing toward goals    Frequency    BID      PT Plan Current plan remains appropriate       AM-PAC PT "6 Clicks" Mobility   Outcome Measure  Help needed turning from your back to your side while in a flat bed without using bedrails?: A Little Help needed moving from lying on your back to sitting on the side of a flat bed without using bedrails?: A Lot Help needed moving to and from a bed to a chair (including a wheelchair)?: A Lot Help needed standing up from a chair using your arms (e.g., wheelchair or bedside chair)?: A Lot Help needed to walk in hospital room?: A Lot Help needed climbing 3-5 steps with a railing? : A Lot  6 Click Score: 13    End of Session Equipment Utilized During Treatment: Oxygen (3 L) Activity Tolerance: Patient tolerated treatment well;Patient limited by pain Patient left: in bed;with call bell/phone within reach;with bed alarm set Nurse Communication: Mobility status;Precautions PT Visit Diagnosis: Other abnormalities of gait and mobility (R26.89);Muscle weakness (generalized) (M62.81);History of falling  (Z91.81);Difficulty in walking, not elsewhere classified (R26.2);Pain Pain - Right/Left: Right Pain - part of body: Hip     Time: 1204-1220 PT Time Calculation (min) (ACUTE ONLY): 16 min  Charges:  $Therapeutic Exercise: 8-22 mins                     Jetta Lout PTA 10/14/19, 12:31 PM

## 2020-04-08 ENCOUNTER — Emergency Department: Payer: Medicare Other

## 2020-04-08 ENCOUNTER — Emergency Department
Admission: EM | Admit: 2020-04-08 | Discharge: 2020-04-08 | Disposition: A | Discharge status: Home / Self Care | Payer: Medicare Other | Attending: Emergency Medicine | Admitting: Emergency Medicine

## 2020-04-08 ENCOUNTER — Other Ambulatory Visit: Payer: Self-pay

## 2020-04-08 DIAGNOSIS — Z79899 Other long term (current) drug therapy: Secondary | ICD-10-CM | POA: Diagnosis not present

## 2020-04-08 DIAGNOSIS — E079 Disorder of thyroid, unspecified: Secondary | ICD-10-CM | POA: Insufficient documentation

## 2020-04-08 DIAGNOSIS — F0390 Unspecified dementia without behavioral disturbance: Secondary | ICD-10-CM | POA: Diagnosis not present

## 2020-04-08 DIAGNOSIS — S0990XA Unspecified injury of head, initial encounter: Secondary | ICD-10-CM | POA: Diagnosis present

## 2020-04-08 DIAGNOSIS — Z7901 Long term (current) use of anticoagulants: Secondary | ICD-10-CM | POA: Diagnosis not present

## 2020-04-08 DIAGNOSIS — S0181XA Laceration without foreign body of other part of head, initial encounter: Secondary | ICD-10-CM | POA: Diagnosis not present

## 2020-04-08 DIAGNOSIS — Z23 Encounter for immunization: Secondary | ICD-10-CM | POA: Diagnosis not present

## 2020-04-08 DIAGNOSIS — I10 Essential (primary) hypertension: Secondary | ICD-10-CM | POA: Insufficient documentation

## 2020-04-08 DIAGNOSIS — S0101XA Laceration without foreign body of scalp, initial encounter: Secondary | ICD-10-CM

## 2020-04-08 DIAGNOSIS — W050XXA Fall from non-moving wheelchair, initial encounter: Secondary | ICD-10-CM | POA: Diagnosis not present

## 2020-04-08 LAB — CBC WITH DIFFERENTIAL/PLATELET
Abs Immature Granulocytes: 0.07 10*3/uL (ref 0.00–0.07)
Basophils Absolute: 0 10*3/uL (ref 0.0–0.1)
Basophils Relative: 1 %
Eosinophils Absolute: 0.1 10*3/uL (ref 0.0–0.5)
Eosinophils Relative: 2 %
HCT: 37.7 % (ref 36.0–46.0)
Hemoglobin: 12.3 g/dL (ref 12.0–15.0)
Immature Granulocytes: 1 %
Lymphocytes Relative: 24 %
Lymphs Abs: 1.5 10*3/uL (ref 0.7–4.0)
MCH: 28 pg (ref 26.0–34.0)
MCHC: 32.6 g/dL (ref 30.0–36.0)
MCV: 85.7 fL (ref 80.0–100.0)
Monocytes Absolute: 0.7 10*3/uL (ref 0.1–1.0)
Monocytes Relative: 11 %
Neutro Abs: 3.9 10*3/uL (ref 1.7–7.7)
Neutrophils Relative %: 61 %
Platelets: 142 10*3/uL — ABNORMAL LOW (ref 150–400)
RBC: 4.4 MIL/uL (ref 3.87–5.11)
RDW: 17.2 % — ABNORMAL HIGH (ref 11.5–15.5)
WBC: 6.2 10*3/uL (ref 4.0–10.5)
nRBC: 0 % (ref 0.0–0.2)

## 2020-04-08 LAB — BASIC METABOLIC PANEL
Anion gap: 11 (ref 5–15)
BUN: 47 mg/dL — ABNORMAL HIGH (ref 8–23)
CO2: 28 mmol/L (ref 22–32)
Calcium: 9.7 mg/dL (ref 8.9–10.3)
Chloride: 100 mmol/L (ref 98–111)
Creatinine, Ser: 1.11 mg/dL — ABNORMAL HIGH (ref 0.44–1.00)
GFR, Estimated: 45 mL/min — ABNORMAL LOW (ref >60)
Glucose, Bld: 101 mg/dL — ABNORMAL HIGH (ref 70–99)
Potassium: 5.1 mmol/L (ref 3.5–5.1)
Sodium: 139 mmol/L (ref 135–145)

## 2020-04-08 MED ORDER — TETANUS-DIPHTHERIA TOXOIDS TD 5-2 LFU IM INJ: 0.5000 mL | INJECTION | Freq: Once | INTRAMUSCULAR | Status: DC

## 2020-04-08 MED ORDER — TETANUS-DIPHTH-ACELL PERTUSSIS 5-2.5-18.5 LF-MCG/0.5 IM SUSY
0.5000 mL | PREFILLED_SYRINGE | Freq: Once | INTRAMUSCULAR | Status: AC
  Administered 2020-04-08: 0.5 mL via INTRAMUSCULAR
  Filled 2020-04-08: qty 0.5

## 2020-04-08 NOTE — Discharge Instructions (Addendum)
Sutures will need to be removed in 5 to 7 days.  Take Tylenol 1 g every 8 hours to help with pain.  Discussion should be had with patient's primary doctor on whether or not she should be continued on Eliquis due to her recurrent falls.  Put antibiotic ON on it 3 times a day  CT scans were otherwise negative.  X-ray was concerning for possible mass versus just positioning of pt.  Given her age I do not think following up with an x-ray is needed here in ER given her oxygen levels are normal  but you can follow up with your primary care doctor

## 2020-04-08 NOTE — ED Provider Notes (Signed)
[facility] Emergency Department Provider Note  ____________________________________________   Event Date/Time   First MD Initiated Contact with Patient 04/08/20 1109     (approximate)  I have reviewed the triage vital signs and the nursing notes.   HISTORY  Chief Complaint Fall, Head Injury, and Head Laceration    HPI Christina Wise is a 87 y.o. female with hypertension, dementia who comes in for fall.  Patient fell out of her wheelchair on the ground.  With bleeding from her head.  Patient is on Eliquis.  Patient unable to get full HPI due to her baseline dementia          Past Medical History:  Diagnosis Date  . Hearing impairment   . Hypertension   . Mild dementia (HCC)   . Thyroid disease     Patient Active Problem List   Diagnosis Date Noted  . Fall   . DNR (do not resuscitate)   . Goals of care, counseling/discussion   . Advanced care planning/counseling discussion   . Palliative care by specialist   . Hip fracture (HCC) 10/06/2019  . 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 Protein-calorie malnutrition, severe 05/03/2018  . Hyponatremia 05/01/2018    Past Surgical History:  Procedure Laterality Date  . CHOLECYSTECTOMY    . INTRAMEDULLARY (IM) NAIL INTERTROCHANTERIC Right 10/10/2019   Procedure: INTRAMEDULLARY (IM) NAIL INTERTROCHANTRIC;  Surgeon: Juanell FairlyKrasinski, Kevin, MD;  Location: [facility] ORS;  Service: Orthopedics;  Laterality: Right;    Prior to Admission medications   Medication Sig Start Date End Date Taking? Authorizing Provider  acetaminophen (TYLENOL) 500 MG tablet Take 500 mg by mouth in the morning, at noon, and at bedtime.     [provider]  ALPRAZolam Prudy Feeler(XANAX) 0.5 MG tablet Take 0.5 mg by mouth 2 (two) times daily.    [provider]  amLODipine (NORVASC) 10 MG tablet Take 1 tablet (10 mg total) by mouth daily. 10/15/19    Lynn ItoAmery, Sahar, MD  cholecalciferol (VITAMIN D) 25 MCG tablet Take 1 tablet (1,000 Units total) by mouth daily. 10/15/19   Lynn ItoAmery, Sahar, MD  divalproex (DEPAKOTE) 125 MG DR tablet Take 125 mg by mouth 2 (two) times daily.    [provider]  docusate sodium (COLACE) 100 MG capsule Take 1 capsule (100 mg total) by mouth 2 (two) times daily. 10/14/19   Lynn ItoAmery, Sahar, MD  feeding supplement, ENSURE ENLIVE, (ENSURE ENLIVE) LIQD Take 237 mLs by mouth 3 (three) times daily between meals. 10/14/19   Lynn ItoAmery, Sahar, MD  Glucosamine-Chondroitin 250-200 MG TABS Take 1 tablet by mouth daily.    [provider]  levothyroxine (SYNTHROID) 100 MCG tablet Take 100 mcg by mouth daily.     [provider]  methocarbamol (ROBAXIN) 500 MG tablet Take 1 tablet (500 mg total) by mouth every 6 (six) hours as needed for muscle spasms. 10/14/19   Lynn ItoAmery, Sahar, MD  polyethylene glycol (MIRALAX / GLYCOLAX) 17 g packet Take 17 g by mouth daily as needed for mild constipation. 10/14/19   Lynn ItoAmery, Sahar, MD  sennosides-docusate sodium (SENOKOT-S) 8.6-50 MG tablet Take 1 tablet by mouth daily.    [provider]  sertraline (ZOLOFT) 25 MG tablet Take 25 mg by mouth daily.    [provider]  XARELTO 10 MG TABS tablet Take 10 mg by mouth daily. 03/03/19   [provider]    Allergies Alendronate, Codeine, and Risedronate  Family  History  Problem Relation Age of Onset  . Hypertension Mother     Social History Social History   Tobacco Use  . Smoking status: Never Smoker  . Smokeless tobacco: Never Used  Substance Use Topics  . Alcohol use: Never  . Drug use: Never      Review of Systems Unable to get full ROS due to dementia.  ____________________________________________   PHYSICAL EXAM:  VITAL SIGNS: ED Triage Vitals  Enc Vitals Group     BP 04/08/20 1117 (!) 161/74     Pulse Rate 04/08/20 1117 62     Resp 04/08/20 1117 15     Temp 04/08/20 1117 98.2 F (36.8 C)      Temp Source 04/08/20 1117 Oral     SpO2 04/08/20 1117 96 %     Weight 04/08/20 1119 149 lb 14.6 oz (68 kg)     Height 04/08/20 1119 5\' 4"  (1.626 m)     Head Circumference --      Peak Flow --      Pain Score 04/08/20 1117 0     Pain Loc --      Pain Edu? --      Excl. in GC? --     Constitutional: pt with dementia. Well appearing and in no acute distress. Eyes: Conjunctivae are normal. EOMI. Head: laceration on forehead with active venous bleeding. Nose: No congestion/rhinnorhea. Mouth/Throat: Mucous membranes are moist.   Neck: No stridor. Trachea Midline. FROM Cardiovascular: Normal rate, regular rhythm. Grossly normal heart sounds.  Good peripheral circulation. No obvious chest wall tenderness Respiratory: Normal respiratory effort.  No retractions. Lungs CTAB. Gastrointestinal: Soft and nontender. No distention. No abdominal bruits.  Musculoskeletal: No lower extremity tenderness nor edema.  No joint effusions. Neurologic:  Unable to assess due to severe dementia but no obvious deficit  Skin:  Skin is warm, dry and intact. No rash noted. Psychiatric:cant access  GU: Deferred   ____________________________________________   LABS (all labs ordered are listed, but only abnormal results are displayed)  Labs Reviewed  CBC WITH DIFFERENTIAL/PLATELET - Abnormal; Notable for the following components:      Result Value   RDW 17.2 (*)    Platelets 142 (*)    All other components within normal limits  BASIC METABOLIC PANEL - Abnormal; Notable for the following components:   Glucose, Bld 101 (*)    BUN 47 (*)    Creatinine, Ser 1.11 (*)    GFR, Estimated 45 (*)    All other components within normal limits   ____________________________________________     RADIOLOGY 04/10/20, personally viewed and evaluated these images (plain radiographs) as part of my medical decision making, as well as reviewing the written report by the radiologist.  ED MD interpretation:   No obvious rib/hip fractures   Official radiology report(s): CT Head Wo Contrast  Result Date: 04/08/2020 CLINICAL DATA:  Unwitnessed fall.  Head injury with laceration. EXAM: CT HEAD WITHOUT CONTRAST CT MAXILLOFACIAL WITHOUT CONTRAST CT CERVICAL SPINE WITHOUT CONTRAST TECHNIQUE: Multidetector CT imaging of the head, cervical spine, and maxillofacial structures were performed using the standard protocol without intravenous contrast. Multiplanar CT image reconstructions of the cervical spine and maxillofacial structures were also generated. COMPARISON:  CTs the head and cervical spine 10/06/2019. FINDINGS: CT HEAD FINDINGS Brain: There is no evidence of acute intracranial hemorrhage, mass lesion, brain edema or extra-axial fluid collection. Stable mild atrophy with mild prominence of the ventricles and subarachnoid spaces. Stable mild chronic  small vessel ischemic changes in the periventricular white matter. There is no CT evidence of acute cortical infarction. Stable scattered fat deposits and calcifications along the interhemispheric septum. Vascular: Intracranial vascular calcifications. No hyperdense vessel identified. Skull: Negative for fracture or focal lesion. Other: None. CT MAXILLOFACIAL FINDINGS Osseous: Mild motion artifact. No evidence of acute maxillofacial fracture. The mandible and temporomandibular joints are intact. Orbits: The globes are intact. No evidence orbital hematoma. Previous bilateral lens surgery. Sinuses: Probable large right maxillary sinus mucous retention cysts. Partial opacification of the right frontal and anterior ethmoid sinuses. No air-fluid levels. The mastoid air cells and middle ears are clear. Soft tissues: No focal soft tissue swelling or foreign body identified. CT CERVICAL SPINE FINDINGS Alignment: Stable reversal of the usual cervical lordosis and a mild anterolisthesis at C3-4. Skull base and vertebrae: No evidence of acute fracture or traumatic subluxation. Soft  tissues and spinal canal: No prevertebral fluid or swelling. No visible canal hematoma. Disc levels: Multilevel spondylosis with disc space narrowing, uncinate spurring and facet hypertrophy. The facet joints are ankylosed on the left at C2-3 and C4-5. Associated chronic multilevel foraminal narrowing, similar to previous study. Upper chest: No acute findings. Other: Bilateral carotid atherosclerosis. IMPRESSION: 1. No acute intracranial or calvarial findings. Stable atrophy and chronic small vessel ischemic changes. 2. No evidence of acute maxillofacial fracture or orbital hematoma. New partial opacification of the right frontal and anterior ethmoid sinuses. 3. No evidence of acute cervical spine fracture, traumatic subluxation or static signs of instability. 4. Multilevel cervical spondylosis, similar to previous study. Electronically Signed   By: Carey Bullocks M.D.   On: 04/08/2020 13:53   CT Cervical Spine Wo Contrast  Result Date: 04/08/2020 CLINICAL DATA:  Unwitnessed fall.  Head injury with laceration. EXAM: CT HEAD WITHOUT CONTRAST CT MAXILLOFACIAL WITHOUT CONTRAST CT CERVICAL SPINE WITHOUT CONTRAST TECHNIQUE: Multidetector CT imaging of the head, cervical spine, and maxillofacial structures were performed using the standard protocol without intravenous contrast. Multiplanar CT image reconstructions of the cervical spine and maxillofacial structures were also generated. COMPARISON:  CTs the head and cervical spine 10/06/2019. FINDINGS: CT HEAD FINDINGS Brain: There is no evidence of acute intracranial hemorrhage, mass lesion, brain edema or extra-axial fluid collection. Stable mild atrophy with mild prominence of the ventricles and subarachnoid spaces. Stable mild chronic small vessel ischemic changes in the periventricular white matter. There is no CT evidence of acute cortical infarction. Stable scattered fat deposits and calcifications along the interhemispheric septum. Vascular: Intracranial  vascular calcifications. No hyperdense vessel identified. Skull: Negative for fracture or focal lesion. Other: None. CT MAXILLOFACIAL FINDINGS Osseous: Mild motion artifact. No evidence of acute maxillofacial fracture. The mandible and temporomandibular joints are intact. Orbits: The globes are intact. No evidence orbital hematoma. Previous bilateral lens surgery. Sinuses: Probable large right maxillary sinus mucous retention cysts. Partial opacification of the right frontal and anterior ethmoid sinuses. No air-fluid levels. The mastoid air cells and middle ears are clear. Soft tissues: No focal soft tissue swelling or foreign body identified. CT CERVICAL SPINE FINDINGS Alignment: Stable reversal of the usual cervical lordosis and a mild anterolisthesis at C3-4. Skull base and vertebrae: No evidence of acute fracture or traumatic subluxation. Soft tissues and spinal canal: No prevertebral fluid or swelling. No visible canal hematoma. Disc levels: Multilevel spondylosis with disc space narrowing, uncinate spurring and facet hypertrophy. The facet joints are ankylosed on the left at C2-3 and C4-5. Associated chronic multilevel foraminal narrowing, similar to previous study. Upper chest: No acute  findings. Other: Bilateral carotid atherosclerosis. IMPRESSION: 1. No acute intracranial or calvarial findings. Stable atrophy and chronic small vessel ischemic changes. 2. No evidence of acute maxillofacial fracture or orbital hematoma. New partial opacification of the right frontal and anterior ethmoid sinuses. 3. No evidence of acute cervical spine fracture, traumatic subluxation or static signs of instability. 4. Multilevel cervical spondylosis, similar to previous study. Electronically Signed   By: Carey Bullocks M.D.   On: 04/08/2020 13:53   DG Pelvis Portable  Result Date: 04/08/2020 CLINICAL DATA:  Fall EXAM: PORTABLE PELVIS 1-2 VIEWS COMPARISON:  October 10, 2019 FINDINGS: Status post intramedullary rod fixation  of the RIGHT femur. Incomplete visualization of the orthopedic hardware. There is increased callus formation adjacent to the fracture line along the intertrochanteric femur. Osteopenia. No definitive acute fracture on these limited views. Limited assessment of the sacrum secondary to overlying stool and bowel gas. Degenerative changes of the lumbar spine. IMPRESSION: 1. Status post intramedullary rod fixation of the RIGHT femur. There is callus formation along the prior RIGHT intertrochanteric femur fracture line. 2. No definitive acute fracture on these limited views. If persistent concern, recommend additional dedicated radiographs. Electronically Signed   By: Meda Klinefelter MD   On: 04/08/2020 13:56   DG Chest Portable 1 View  Result Date: 04/08/2020 CLINICAL DATA:  Fall EXAM: PORTABLE CHEST 1 VIEW COMPARISON:  October 12, 2019 FINDINGS: The cardiomediastinal silhouette is unchanged in contour.Atherosclerotic calcifications of the aorta. No pleural effusion. No pneumothorax. Unchanged linear opacities of the RIGHT upper lung, likely scar. Unchanged diffuse interstitial prominence, likely sequela of chronic lung changes. Asymmetric hazy density overlying the RIGHT apex in comparison to the contralateral apex. This could be artifactual due to patient rotation. Status post cholecystectomy. Multilevel degenerative changes of the thoracic spine. IMPRESSION: Asymmetric hazy density overlying the RIGHT apex in comparison to the contralateral apex. This could be artifactual due to patient rotation. Repeat PA and lateral chest radiograph could be performed for further evaluation. Electronically Signed   By: Meda Klinefelter MD   On: 04/08/2020 14:00   CT MAXILLOFACIAL WO CONTRAST  Result Date: 04/08/2020 CLINICAL DATA:  Unwitnessed fall.  Head injury with laceration. EXAM: CT HEAD WITHOUT CONTRAST CT MAXILLOFACIAL WITHOUT CONTRAST CT CERVICAL SPINE WITHOUT CONTRAST TECHNIQUE: Multidetector CT imaging of the  head, cervical spine, and maxillofacial structures were performed using the standard protocol without intravenous contrast. Multiplanar CT image reconstructions of the cervical spine and maxillofacial structures were also generated. COMPARISON:  CTs the head and cervical spine 10/06/2019. FINDINGS: CT HEAD FINDINGS Brain: There is no evidence of acute intracranial hemorrhage, mass lesion, brain edema or extra-axial fluid collection. Stable mild atrophy with mild prominence of the ventricles and subarachnoid spaces. Stable mild chronic small vessel ischemic changes in the periventricular white matter. There is no CT evidence of acute cortical infarction. Stable scattered fat deposits and calcifications along the interhemispheric septum. Vascular: Intracranial vascular calcifications. No hyperdense vessel identified. Skull: Negative for fracture or focal lesion. Other: None. CT MAXILLOFACIAL FINDINGS Osseous: Mild motion artifact. No evidence of acute maxillofacial fracture. The mandible and temporomandibular joints are intact. Orbits: The globes are intact. No evidence orbital hematoma. Previous bilateral lens surgery. Sinuses: Probable large right maxillary sinus mucous retention cysts. Partial opacification of the right frontal and anterior ethmoid sinuses. No air-fluid levels. The mastoid air cells and middle ears are clear. Soft tissues: No focal soft tissue swelling or foreign body identified. CT CERVICAL SPINE FINDINGS Alignment: Stable reversal of the usual cervical  lordosis and a mild anterolisthesis at C3-4. Skull base and vertebrae: No evidence of acute fracture or traumatic subluxation. Soft tissues and spinal canal: No prevertebral fluid or swelling. No visible canal hematoma. Disc levels: Multilevel spondylosis with disc space narrowing, uncinate spurring and facet hypertrophy. The facet joints are ankylosed on the left at C2-3 and C4-5. Associated chronic multilevel foraminal narrowing, similar to  previous study. Upper chest: No acute findings. Other: Bilateral carotid atherosclerosis. IMPRESSION: 1. No acute intracranial or calvarial findings. Stable atrophy and chronic small vessel ischemic changes. 2. No evidence of acute maxillofacial fracture or orbital hematoma. New partial opacification of the right frontal and anterior ethmoid sinuses. 3. No evidence of acute cervical spine fracture, traumatic subluxation or static signs of instability. 4. Multilevel cervical spondylosis, similar to previous study. Electronically Signed   By: Carey Bullocks M.D.   On: 04/08/2020 13:53    ____________________________________________   PROCEDURES  Procedure(s) performed (including Critical Care):  Marland KitchenMarland KitchenLaceration Repair  Date/Time: 04/08/2020 5:46 PM Performed by: Concha Se, MD Authorized by: Concha Se, MD   Consent:    Consent obtained:  Emergent situation Laceration details:    Location:  Face   Face location:  Forehead   Length (cm):  10   Depth (mm):  5 Pre-procedure details:    Preparation:  Patient was prepped and draped in usual sterile fashion Exploration:    Imaging outcome: foreign body not noted     Wound exploration: entire depth of wound visualized     Contaminated: no   Treatment:    Area cleansed with:  Povidone-iodine   Irrigation solution:  Sterile saline   Irrigation method:  Syringe   Visualized foreign bodies/material removed: no     Debridement:  Minimal   Layers/structures repaired:  Deep dermal/superficial fascia Deep dermal/superficial fascia:    Suture size:  6-0   Suture material:  Monocryl   Suture technique:  Simple interrupted   Number of sutures:  2 Skin repair:    Repair method:  Sutures   Suture size:  5-0   Suture material:  Prolene   Suture technique:  Simple interrupted   Number of sutures:  7 Repair type:    Repair type:  Complex Post-procedure details:    Dressing:  Sterile dressing   Procedure completion:  Tolerated well, no  immediate complications Comments:     Irregular stellate shaped laceration with small triangle skin flap mostly torn off with dusky color therefore debrided and small venous bleed unable to stop with pressure. 2 figure of 8 stopped bleed. Then skin was closed.    ____________________________________________   INITIAL IMPRESSION / ASSESSMENT AND PLAN / ED COURSE  CAPRISHA BRIDGETT was evaluated in Emergency Department on 04/08/2020 for the symptoms described in the history of present illness. She was evaluated in the context of the global COVID-19 pandemic, which necessitated consideration that the patient might be at risk for infection with the SARS-CoV-2 virus that causes COVID-19. Institutional protocols and algorithms that pertain to the evaluation of patients at risk for COVID-19 are in a state of rapid change based on information released by regulatory bodies including the CDC and federal and state organizations. These policies and algorithms were followed during the patient's care in the ED.    Mechanical fall in dementia pt with large laceration. Laceration repaired as above. CT to rule out ICH/facial fracture/cervical fracture. No obvious other injuries although limited due to severe dementia. Will get screen chest/pelvis xray but doesn't  seem to be any other injuries  CT scan negative  Xray negative possible mass vs rotation. Not hypoxic and d/w son in room will hold off on another chest xray given age unlikely to change management.          ____________________________________________   FINAL CLINICAL IMPRESSION(S) / ED DIAGNOSES   Final diagnoses:  Injury of head, initial encounter  Laceration of scalp, initial encounter      MEDICATIONS GIVEN DURING THIS VISIT:  Medications  Tdap (BOOSTRIX) injection 0.5 mL (has no administration in time range)     ED Discharge Orders    None       Note:  This document was prepared using Dragon voice recognition  software and may include unintentional dictation errors.   Concha Se, MD 04/08/20 [phone]

## 2020-04-08 NOTE — ED Triage Notes (Signed)
Pt presents to the [facility] via EMS from [facility] with c/o head injury and head laceration secondary to unwitnessed mechanical fall from ground level. [facility] staff states that pt is wheelchair bound and found pt on the floor with laceration to forehead. Pt currently on Xarelto. Dressing placed by EMS with bleeding controlled upon arrival. Pt has hx of dementia and is currently at baseline per [facility] staff.

## 2020-05-13 ENCOUNTER — Emergency Department: Payer: Medicare Other

## 2020-05-13 ENCOUNTER — Emergency Department
Admission: EM | Admit: 2020-05-13 | Discharge: 2020-05-13 | Disposition: A | Discharge status: Home / Self Care | Payer: Medicare Other | Attending: Emergency Medicine | Admitting: Emergency Medicine

## 2020-05-13 ENCOUNTER — Other Ambulatory Visit: Payer: Self-pay

## 2020-05-13 DIAGNOSIS — I10 Essential (primary) hypertension: Secondary | ICD-10-CM | POA: Insufficient documentation

## 2020-05-13 DIAGNOSIS — Y92129 Unspecified place in nursing home as the place of occurrence of the external cause: Secondary | ICD-10-CM | POA: Insufficient documentation

## 2020-05-13 DIAGNOSIS — Z79899 Other long term (current) drug therapy: Secondary | ICD-10-CM | POA: Insufficient documentation

## 2020-05-13 DIAGNOSIS — F0390 Unspecified dementia without behavioral disturbance: Secondary | ICD-10-CM | POA: Diagnosis not present

## 2020-05-13 DIAGNOSIS — W19XXXA Unspecified fall, initial encounter: Secondary | ICD-10-CM | POA: Insufficient documentation

## 2020-05-13 DIAGNOSIS — S0990XA Unspecified injury of head, initial encounter: Secondary | ICD-10-CM | POA: Diagnosis present

## 2020-05-13 DIAGNOSIS — Z7901 Long term (current) use of anticoagulants: Secondary | ICD-10-CM | POA: Diagnosis not present

## 2020-05-13 DIAGNOSIS — S0181XA Laceration without foreign body of other part of head, initial encounter: Secondary | ICD-10-CM | POA: Insufficient documentation

## 2020-05-13 MED ORDER — LIDOCAINE HCL (PF) 1 % IJ SOLN: 5.0000 mL | Freq: Once | INTRAMUSCULAR | Status: AC

## 2020-05-13 MED ORDER — LIDOCAINE HCL (PF) 1 % IJ SOLN
INTRAMUSCULAR | Status: AC
  Administered 2020-05-13: 5 mL
  Filled 2020-05-13: qty 5

## 2020-05-13 NOTE — ED Notes (Signed)
Pt's laceration was cleaned and dried. Place a xeroform dressing and covered with gauze.

## 2020-05-13 NOTE — ED Notes (Signed)
Called [facility] for transport to [facility] [phone]

## 2020-05-13 NOTE — ED Notes (Signed)
Dr. Lenard Lance MD at bedside.

## 2020-05-13 NOTE — ED Notes (Signed)
Attempted to call [facility] for a 3rd time. Unsuccessful attempt.

## 2020-05-13 NOTE — ED Notes (Addendum)
Attempted to call [facility] twice at this time with no answer. Will try again shortly

## 2020-05-13 NOTE — ED Provider Notes (Signed)
[facility] Emergency Department Provider Note  Time seen: 11:52 AM  I have reviewed the triage vital signs and the nursing notes.   HISTORY  Chief Complaint Fall   HPI Christina Wise is a 87 y.o. female with a past medical history of dementia, hypertension, presents from a nursing facility after an unwitnessed fall.  According to EMS patient was found down in her room, unwitnessed fall.  History of dementia patient cannot contribute to her history review of systems is largely nonverbal.  Patient is calm lying in bed, no distress.   Past Medical History:  Diagnosis Date  . Hearing impairment   . Hypertension   . Mild dementia (HCC)   . Thyroid disease     Patient Active Problem List   Diagnosis Date Noted  . Fall   . DNR (do not resuscitate)   . Goals of care, counseling/discussion   . Advanced care planning/counseling discussion   . Palliative care by specialist   . Hip fracture (HCC) 10/06/2019  . 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 Protein-calorie malnutrition, severe 05/03/2018  . Hyponatremia 05/01/2018    Past Surgical History:  Procedure Laterality Date  . CHOLECYSTECTOMY    . INTRAMEDULLARY (IM) NAIL INTERTROCHANTERIC Right 10/10/2019   Procedure: INTRAMEDULLARY (IM) NAIL INTERTROCHANTRIC;  Surgeon: Juanell Fairly, MD;  Location: [facility] ORS;  Service: Orthopedics;  Laterality: Right;    Prior to Admission medications   Medication Sig Start Date End Date Taking? Authorizing Provider  acetaminophen (TYLENOL) 500 MG tablet Take 500 mg by mouth in the morning, at noon, and at bedtime.     [provider]  ALPRAZolam Prudy Feeler) 0.5 MG tablet Take 0.5 mg by mouth 2 (two) times daily.    [provider]  amLODipine (NORVASC) 10 MG tablet Take 1 tablet (10 mg total) by mouth daily. 10/15/19   Lynn Ito, MD  cholecalciferol (VITAMIN D) 25 MCG tablet  Take 1 tablet (1,000 Units total) by mouth daily. 10/15/19   Lynn Ito, MD  divalproex (DEPAKOTE) 125 MG DR tablet Take 125 mg by mouth 2 (two) times daily.    [provider]  docusate sodium (COLACE) 100 MG capsule Take 1 capsule (100 mg total) by mouth 2 (two) times daily. 10/14/19   Lynn Ito, MD  feeding supplement, ENSURE ENLIVE, (ENSURE ENLIVE) LIQD Take 237 mLs by mouth 3 (three) times daily between meals. 10/14/19   Lynn Ito, MD  Glucosamine-Chondroitin 250-200 MG TABS Take 1 tablet by mouth daily.    [provider]  levothyroxine (SYNTHROID) 100 MCG tablet Take 100 mcg by mouth daily.     [provider]  methocarbamol (ROBAXIN) 500 MG tablet Take 1 tablet (500 mg total) by mouth every 6 (six) hours as needed for muscle spasms. 10/14/19   Lynn Ito, MD  polyethylene glycol (MIRALAX / GLYCOLAX) 17 g packet Take 17 g by mouth daily as needed for mild constipation. 10/14/19   Lynn Ito, MD  sennosides-docusate sodium (SENOKOT-S) 8.6-50 MG tablet Take 1 tablet by mouth daily.    [provider]  sertraline (ZOLOFT) 25 MG tablet Take 25 mg by mouth daily.    [provider]  XARELTO 10 MG TABS tablet Take 10 mg by mouth daily. 03/03/19   [provider]    Allergies  Allergen Reactions  . Alendronate Other (See Comments)    GI upset  . Codeine Nausea And Vomiting  .  Risedronate Other (See Comments)    GI Upset    Family History  Problem Relation Age of Onset  . Hypertension Mother     Social History Social History   Tobacco Use  . Smoking status: Never Smoker  . Smokeless tobacco: Never Used  Substance Use Topics  . Alcohol use: Never  . Drug use: Never    Review of Systems Unable to obtain adequate/accurate review of systems secondary to dementia  ____________________________________________   PHYSICAL EXAM:  VITAL SIGNS: ED Triage Vitals  Enc Vitals Group     BP 05/13/20 1124 123/62     Pulse Rate  05/13/20 1124 (!) 58     Resp 05/13/20 1124 15     Temp 05/13/20 1124 97.7 F (36.5 C)     Temp Source 05/13/20 1124 Oral     SpO2 05/13/20 1124 96 %     Weight 05/13/20 1126 110 lb (49.9 kg)     Height 05/13/20 1126 5\' 4"  (1.626 m)     Head Circumference --      Peak Flow --      Pain Score --      Pain Loc --      Pain Edu? --      Excl. in GC? --     Constitutional: Patient is awake lying in bed, no distress. Eyes: Normal exam ENT      Head: Moderate hematoma to right forehead with overlying abrasion/laceration.      Mouth/Throat: Mucous membranes are moist. Cardiovascular: Normal rate, regular rhythm.  Respiratory: Normal respiratory effort without tachypnea nor retractions. Breath sounds are clear  Gastrointestinal: Soft and nontender. No distention.  Musculoskeletal: Nontender with normal range of motion in all extremities Neurologic:  Normal speech and language. No gross focal neurologic deficits Skin: Abrasion/laceration to right forehead with hematoma.  Hemostatic. Psychiatric: Mood and affect are normal.   ____________________________________________    EKG  EKG viewed and interpreted by myself shows a normal sinus rhythm at 59 bpm with a narrow QRS, normal axis, normal intervals, no concerning ST changes.  ____________________________________________    RADIOLOGY  CT scan negative for intracranial abnormality patient does have large scalp hematoma with overlying laceration.  No C-spine fracture.  ____________________________________________   INITIAL IMPRESSION / ASSESSMENT AND PLAN / ED COURSE  Pertinent labs & imaging results that were available during my care of the patient were reviewed by me and considered in my medical decision making (see chart for details).   Patient presents to the emergency department after an unwitnessed fall at her nursing facility.  Per EMS her baseline is nonverbal.  Cannot contribute to her fall or review of systems.   Patient is calm, no distress.  We will obtain CT imaging of the head and C-spine as a precaution.  Patient does have moderate size hematoma to the right forehead with an overlying abrasion/laceration, very thin skin does not appear to be amenable to suture repair, currently hemostatic.  I cleaned the patient's anterior laceration and hematoma, was able to repair with sutures.  CT scans are negative for acute intracranial abnormality.  Patient will be discharged to a nursing facility.  LACERATION REPAIR Performed by: Authorized by: Minna Antis Consent: Verbal consent obtained. Risks and benefits: risks, benefits and alternatives were discussed Consent given by: patient Patient identity confirmed: provided demographic data Prepped and Draped in normal sterile fashion Wound explored  Laceration Location: Right forehead  Laceration Length: 2 cm  No Foreign Bodies seen  or palpated  Anesthesia: local infiltration  Local anesthetic: lidocaine 1% without epinephrine  Anesthetic total: 5 ml  Irrigation method: syringe Amount of cleaning: standard  Skin closure: 5-0 rapid Vicryl  Number of sutures: 6  Technique: Simple interrupted  Patient tolerance: Patient tolerated the procedure well with no immediate complications.   Christina Wise was evaluated in Emergency Department on 05/13/2020 for the symptoms described in the history of present illness. She was evaluated in the context of the global COVID-19 pandemic, which necessitated consideration that the patient might be at risk for infection with the SARS-CoV-2 virus that causes COVID-19. Institutional protocols and algorithms that pertain to the evaluation of patients at risk for COVID-19 are in a state of rapid change based on information released by regulatory bodies including the CDC and federal and state organizations. These policies and algorithms were followed during the patient's care in the  ED.  ____________________________________________   FINAL CLINICAL IMPRESSION(S) / ED DIAGNOSES  Fall Hematoma Laceration   Minna Antis, MD 05/13/20 1450

## 2020-05-13 NOTE — ED Notes (Signed)
Dr. Lenard Lance, MD at bedside.

## 2020-05-13 NOTE — Discharge Instructions (Addendum)
You have sustained a head injury causing a large hematoma to your forehead and a laceration to your forehead.  Your CT scans did not show any abnormality of the brain or cervical spine  The laceration has been repaired with absorbable sutures which do not need to be removed.  There may be continued mild oozing of blood over the next 12 to 24 hours due to the hematoma.  Please change dressing as needed.  Return to the emergency department for any significant bleeding, any changes in the patient's mental state, or any other symptom personally concerning to yourself.

## 2020-05-13 NOTE — ED Notes (Signed)
Antacia, SN changed dressing on pt's forehead multiple times. Pt's laceration still slowly bleeding. Pt is on Xarelto.

## 2020-05-13 NOTE — ED Notes (Signed)
Pt transported to CT scan.

## 2020-05-13 NOTE — ED Notes (Signed)
Updated pt's son, Rane Dumm, r/t pending d/c. Pt verbalized understanding.

## 2020-05-13 NOTE — ED Triage Notes (Signed)
Pt arrived via [facility] from [facility] with c/o unwitnessed fall. Per EMS, pt was found on floor next to bed this AM by staff, unknown down time.   Per EMS, pt takes blood thinners.   Per EMS, according to staff pt is at baseline, non-verbal, follows some commands, bed/wheelchair bound.   Pt noted to have lacerations to anterior and posterior head, with hematoma noted anteriorly. Per EMS, bleeding controlled at this time.

## 2021-02-11 DEATH — deceased
